# Patient Record
Sex: Male | Born: 1945 | Race: White | Hispanic: No | Marital: Married | Smoking: Former smoker
Health system: Southern US, Community
[De-identification: ages and names within clinical notes are randomized; demographics above are authoritative.]

## PROBLEM LIST (undated history)

## (undated) DIAGNOSIS — L309 Dermatitis, unspecified: Secondary | ICD-10-CM

## (undated) DIAGNOSIS — F419 Anxiety disorder, unspecified: Secondary | ICD-10-CM

## (undated) DIAGNOSIS — I493 Ventricular premature depolarization: Secondary | ICD-10-CM

## (undated) DIAGNOSIS — G4730 Sleep apnea, unspecified: Secondary | ICD-10-CM

## (undated) DIAGNOSIS — M1990 Unspecified osteoarthritis, unspecified site: Secondary | ICD-10-CM

## (undated) DIAGNOSIS — Z9289 Personal history of other medical treatment: Secondary | ICD-10-CM

## (undated) DIAGNOSIS — N189 Chronic kidney disease, unspecified: Secondary | ICD-10-CM

## (undated) DIAGNOSIS — I82409 Acute embolism and thrombosis of unspecified deep veins of unspecified lower extremity: Secondary | ICD-10-CM

## (undated) DIAGNOSIS — G4733 Obstructive sleep apnea (adult) (pediatric): Secondary | ICD-10-CM

## (undated) DIAGNOSIS — I10 Essential (primary) hypertension: Secondary | ICD-10-CM

## (undated) DIAGNOSIS — I2510 Atherosclerotic heart disease of native coronary artery without angina pectoris: Secondary | ICD-10-CM

## (undated) DIAGNOSIS — I471 Supraventricular tachycardia: Secondary | ICD-10-CM

## (undated) DIAGNOSIS — G720 Drug-induced myopathy: Secondary | ICD-10-CM

## (undated) DIAGNOSIS — I4719 Other supraventricular tachycardia: Secondary | ICD-10-CM

## (undated) DIAGNOSIS — E785 Hyperlipidemia, unspecified: Secondary | ICD-10-CM

## (undated) DIAGNOSIS — I700 Atherosclerosis of aorta: Secondary | ICD-10-CM

## (undated) DIAGNOSIS — H3530 Unspecified macular degeneration: Secondary | ICD-10-CM

## (undated) DIAGNOSIS — K219 Gastro-esophageal reflux disease without esophagitis: Secondary | ICD-10-CM

## (undated) DIAGNOSIS — N529 Male erectile dysfunction, unspecified: Secondary | ICD-10-CM

## (undated) DIAGNOSIS — T7840XA Allergy, unspecified, initial encounter: Secondary | ICD-10-CM

## (undated) DIAGNOSIS — E119 Type 2 diabetes mellitus without complications: Secondary | ICD-10-CM

## (undated) HISTORY — DX: Other supraventricular tachycardia: I47.19

## (undated) HISTORY — DX: Supraventricular tachycardia: I47.1

## (undated) HISTORY — DX: Unspecified osteoarthritis, unspecified site: M19.90

## (undated) HISTORY — PX: APPENDECTOMY: SHX54

## (undated) HISTORY — DX: Unspecified macular degeneration: H35.30

## (undated) HISTORY — DX: Hyperlipidemia, unspecified: E78.5

## (undated) HISTORY — PX: HERNIA REPAIR: SHX51

## (undated) HISTORY — DX: Personal history of other medical treatment: Z92.89

## (undated) HISTORY — PX: TONSILLECTOMY: SUR1361

## (undated) HISTORY — DX: Atherosclerosis of aorta: I70.0

## (undated) HISTORY — DX: Drug-induced myopathy: G72.0

## (undated) HISTORY — DX: Gastro-esophageal reflux disease without esophagitis: K21.9

## (undated) HISTORY — DX: Anxiety disorder, unspecified: F41.9

## (undated) HISTORY — DX: Atherosclerotic heart disease of native coronary artery without angina pectoris: I25.10

## (undated) HISTORY — DX: Male erectile dysfunction, unspecified: N52.9

## (undated) HISTORY — PX: VASECTOMY: SHX75

## (undated) HISTORY — DX: Sleep apnea, unspecified: G47.30

## (undated) HISTORY — DX: Allergy, unspecified, initial encounter: T78.40XA

## (undated) HISTORY — DX: Chronic kidney disease, unspecified: N18.9

## (undated) HISTORY — DX: Dermatitis, unspecified: L30.9

## (undated) HISTORY — DX: Obstructive sleep apnea (adult) (pediatric): G47.33

## (undated) HISTORY — DX: Ventricular premature depolarization: I49.3

---

## 1978-05-08 HISTORY — PX: HERNIA REPAIR: SHX51

## 2007-11-03 ENCOUNTER — Emergency Department (HOSPITAL_COMMUNITY): Admission: EM | Admit: 2007-11-03 | Discharge: 2007-11-03 | Payer: Self-pay | Admitting: Emergency Medicine

## 2010-10-04 ENCOUNTER — Ambulatory Visit (HOSPITAL_COMMUNITY)
Admission: RE | Admit: 2010-10-04 | Discharge: 2010-10-04 | Disposition: A | Discharge status: Home / Self Care | Payer: 59 | Source: Ambulatory Visit | Attending: Family Medicine | Admitting: Family Medicine

## 2010-10-04 ENCOUNTER — Other Ambulatory Visit: Payer: Self-pay | Admitting: Family Medicine

## 2010-10-04 DIAGNOSIS — M79609 Pain in unspecified limb: Secondary | ICD-10-CM

## 2010-10-04 DIAGNOSIS — R609 Edema, unspecified: Secondary | ICD-10-CM

## 2010-10-04 DIAGNOSIS — M7989 Other specified soft tissue disorders: Secondary | ICD-10-CM | POA: Insufficient documentation

## 2010-10-04 DIAGNOSIS — I82890 Acute embolism and thrombosis of other specified veins: Secondary | ICD-10-CM | POA: Insufficient documentation

## 2011-09-06 DIAGNOSIS — Z7901 Long term (current) use of anticoagulants: Secondary | ICD-10-CM | POA: Diagnosis not present

## 2011-10-05 DIAGNOSIS — M1990 Unspecified osteoarthritis, unspecified site: Secondary | ICD-10-CM | POA: Diagnosis not present

## 2011-10-05 DIAGNOSIS — Z23 Encounter for immunization: Secondary | ICD-10-CM | POA: Diagnosis not present

## 2011-10-05 DIAGNOSIS — I10 Essential (primary) hypertension: Secondary | ICD-10-CM | POA: Diagnosis not present

## 2011-10-05 DIAGNOSIS — Z5181 Encounter for therapeutic drug level monitoring: Secondary | ICD-10-CM | POA: Diagnosis not present

## 2011-10-05 DIAGNOSIS — E119 Type 2 diabetes mellitus without complications: Secondary | ICD-10-CM | POA: Diagnosis not present

## 2011-10-05 DIAGNOSIS — E785 Hyperlipidemia, unspecified: Secondary | ICD-10-CM | POA: Diagnosis not present

## 2011-10-20 DIAGNOSIS — E119 Type 2 diabetes mellitus without complications: Secondary | ICD-10-CM | POA: Diagnosis not present

## 2011-10-20 DIAGNOSIS — I10 Essential (primary) hypertension: Secondary | ICD-10-CM | POA: Diagnosis not present

## 2011-10-20 DIAGNOSIS — R42 Dizziness and giddiness: Secondary | ICD-10-CM | POA: Diagnosis not present

## 2012-02-20 DIAGNOSIS — Z125 Encounter for screening for malignant neoplasm of prostate: Secondary | ICD-10-CM | POA: Diagnosis not present

## 2012-02-20 DIAGNOSIS — E785 Hyperlipidemia, unspecified: Secondary | ICD-10-CM | POA: Diagnosis not present

## 2012-02-20 DIAGNOSIS — Z136 Encounter for screening for cardiovascular disorders: Secondary | ICD-10-CM | POA: Diagnosis not present

## 2012-02-20 DIAGNOSIS — I10 Essential (primary) hypertension: Secondary | ICD-10-CM | POA: Diagnosis not present

## 2012-02-20 DIAGNOSIS — M1990 Unspecified osteoarthritis, unspecified site: Secondary | ICD-10-CM | POA: Diagnosis not present

## 2012-02-20 DIAGNOSIS — Z23 Encounter for immunization: Secondary | ICD-10-CM | POA: Diagnosis not present

## 2012-02-20 DIAGNOSIS — R404 Transient alteration of awareness: Secondary | ICD-10-CM | POA: Diagnosis not present

## 2012-02-20 DIAGNOSIS — E119 Type 2 diabetes mellitus without complications: Secondary | ICD-10-CM | POA: Diagnosis not present

## 2012-02-20 DIAGNOSIS — Z0000 Encounter for general adult medical examination without abnormal findings: Secondary | ICD-10-CM | POA: Diagnosis not present

## 2012-03-13 DIAGNOSIS — G4733 Obstructive sleep apnea (adult) (pediatric): Secondary | ICD-10-CM | POA: Diagnosis not present

## 2012-03-15 DIAGNOSIS — Z87891 Personal history of nicotine dependence: Secondary | ICD-10-CM | POA: Diagnosis not present

## 2012-05-20 DIAGNOSIS — I10 Essential (primary) hypertension: Secondary | ICD-10-CM | POA: Diagnosis not present

## 2012-05-20 DIAGNOSIS — G4733 Obstructive sleep apnea (adult) (pediatric): Secondary | ICD-10-CM | POA: Diagnosis not present

## 2012-05-20 DIAGNOSIS — E669 Obesity, unspecified: Secondary | ICD-10-CM | POA: Diagnosis not present

## 2012-09-02 DIAGNOSIS — J029 Acute pharyngitis, unspecified: Secondary | ICD-10-CM | POA: Diagnosis not present

## 2012-09-19 DIAGNOSIS — I10 Essential (primary) hypertension: Secondary | ICD-10-CM | POA: Diagnosis not present

## 2012-09-19 DIAGNOSIS — H3530 Unspecified macular degeneration: Secondary | ICD-10-CM | POA: Diagnosis not present

## 2012-09-19 DIAGNOSIS — R809 Proteinuria, unspecified: Secondary | ICD-10-CM | POA: Diagnosis not present

## 2012-09-19 DIAGNOSIS — E1129 Type 2 diabetes mellitus with other diabetic kidney complication: Secondary | ICD-10-CM | POA: Diagnosis not present

## 2012-09-19 DIAGNOSIS — E1165 Type 2 diabetes mellitus with hyperglycemia: Secondary | ICD-10-CM | POA: Diagnosis not present

## 2012-09-19 DIAGNOSIS — E785 Hyperlipidemia, unspecified: Secondary | ICD-10-CM | POA: Diagnosis not present

## 2012-10-17 DIAGNOSIS — IMO0001 Reserved for inherently not codable concepts without codable children: Secondary | ICD-10-CM | POA: Diagnosis not present

## 2012-10-30 DIAGNOSIS — H2510 Age-related nuclear cataract, unspecified eye: Secondary | ICD-10-CM | POA: Diagnosis not present

## 2012-10-30 DIAGNOSIS — H353190 Nonexudative age-related macular degeneration, unspecified eye, stage unspecified: Secondary | ICD-10-CM | POA: Diagnosis not present

## 2012-11-13 ENCOUNTER — Emergency Department (HOSPITAL_COMMUNITY)
Admission: EM | Admit: 2012-11-13 | Discharge: 2012-11-14 | Disposition: A | Discharge status: Home / Self Care | Payer: Medicare Other | Attending: Emergency Medicine | Admitting: Emergency Medicine

## 2012-11-13 ENCOUNTER — Encounter (HOSPITAL_COMMUNITY): Payer: Self-pay | Admitting: Emergency Medicine

## 2012-11-13 DIAGNOSIS — Z86718 Personal history of other venous thrombosis and embolism: Secondary | ICD-10-CM | POA: Insufficient documentation

## 2012-11-13 DIAGNOSIS — E876 Hypokalemia: Secondary | ICD-10-CM | POA: Insufficient documentation

## 2012-11-13 DIAGNOSIS — Z79899 Other long term (current) drug therapy: Secondary | ICD-10-CM | POA: Insufficient documentation

## 2012-11-13 DIAGNOSIS — R609 Edema, unspecified: Secondary | ICD-10-CM | POA: Diagnosis not present

## 2012-11-13 DIAGNOSIS — Z7982 Long term (current) use of aspirin: Secondary | ICD-10-CM | POA: Insufficient documentation

## 2012-11-13 DIAGNOSIS — I10 Essential (primary) hypertension: Secondary | ICD-10-CM | POA: Insufficient documentation

## 2012-11-13 DIAGNOSIS — E119 Type 2 diabetes mellitus without complications: Secondary | ICD-10-CM | POA: Diagnosis not present

## 2012-11-13 DIAGNOSIS — R600 Localized edema: Secondary | ICD-10-CM

## 2012-11-13 HISTORY — DX: Essential (primary) hypertension: I10

## 2012-11-13 HISTORY — DX: Type 2 diabetes mellitus without complications: E11.9

## 2012-11-13 HISTORY — DX: Acute embolism and thrombosis of unspecified deep veins of unspecified lower extremity: I82.409

## 2012-11-13 LAB — COMPREHENSIVE METABOLIC PANEL
BUN: 17 mg/dL (ref 6–23)
Calcium: 8.9 mg/dL (ref 8.4–10.5)
Creatinine, Ser: 1 mg/dL (ref 0.50–1.35)
GFR calc Af Amer: 89 mL/min — ABNORMAL LOW (ref >90)
Glucose, Bld: 118 mg/dL — ABNORMAL HIGH (ref 70–99)
Total Protein: 7.1 g/dL (ref 6.0–8.3)

## 2012-11-13 LAB — CBC WITH DIFFERENTIAL/PLATELET
Eosinophils Absolute: 0.6 10*3/uL (ref 0.0–0.7)
Eosinophils Relative: 8 % — ABNORMAL HIGH (ref 0–5)
Hemoglobin: 13.3 g/dL (ref 13.0–17.0)
Lymphs Abs: 2 10*3/uL (ref 0.7–4.0)
MCH: 32.1 pg (ref 26.0–34.0)
MCV: 90.1 fL (ref 78.0–100.0)
Monocytes Absolute: 0.8 10*3/uL (ref 0.1–1.0)
Monocytes Relative: 10 % (ref 3–12)
Platelets: 220 10*3/uL (ref 150–400)
RBC: 4.14 MIL/uL — ABNORMAL LOW (ref 4.22–5.81)

## 2012-11-13 LAB — PROTIME-INR: Prothrombin Time: 13.7 seconds (ref 11.6–15.2)

## 2012-11-13 NOTE — ED Notes (Signed)
PT. REPORTS PROGRESSING RIGHT LOWER LEG / ANKLE SWELLING FOR 2 DAYS WITH LEFT UPPER INNER THIGH PAIN , PT. STATED HISTORY OD OF DVT . SENSATION INTACT / CAP REFILL < 2 SEC.

## 2012-11-14 ENCOUNTER — Ambulatory Visit (HOSPITAL_COMMUNITY)
Admission: RE | Admit: 2012-11-14 | Discharge: 2012-11-14 | Disposition: A | Discharge status: Home / Self Care | Payer: Medicare Other | Source: Ambulatory Visit | Attending: Emergency Medicine | Admitting: Emergency Medicine

## 2012-11-14 DIAGNOSIS — M7989 Other specified soft tissue disorders: Secondary | ICD-10-CM | POA: Diagnosis not present

## 2012-11-14 MED ORDER — POTASSIUM CHLORIDE CRYS ER 20 MEQ PO TBCR
40.0000 meq | EXTENDED_RELEASE_TABLET | Freq: Once | ORAL | Status: AC
  Administered 2012-11-14: 40 meq via ORAL
  Filled 2012-11-14: qty 2

## 2012-11-14 MED ORDER — ENOXAPARIN SODIUM 100 MG/ML ~~LOC~~ SOLN
95.0000 mg | Freq: Once | SUBCUTANEOUS | Status: AC
  Administered 2012-11-14: 02:00:00 via SUBCUTANEOUS
  Filled 2012-11-14: qty 1

## 2012-11-14 NOTE — ED Provider Notes (Signed)
History    CSN: [id number] Arrival date & time 11/13/12  2214  First MD Initiated Contact with Patient 11/14/12 0050     Chief Complaint  Patient presents with  . Leg Swelling   (Consider location/radiation/quality/duration/timing/severity/associated sxs/prior Treatment) HPI 67 yo male presents to the ER with complaint of left ankle swelling, left thigh pain x 2 days.  Pt is concerned for possible DVT.  He has h/o dvt about 1.5 years ago, finished up coumadin in May.  DVT was thought to be triggered by zipline harness.  Pt recently had abrasion/injury to left ankle from dog leash.  Swelling started yesterday, slightly worse today.  No redness.  Left inner thigh pain started today.  No sob, no chest pain, no h/o PE.    Past Medical History  Diagnosis Date  . Hypertension   . Diabetes mellitus without complication   . DVT (deep venous thrombosis)    History reviewed. No pertinent past surgical history. No family history on file. History  Substance Use Topics  . Smoking status: Never Smoker   . Smokeless tobacco: Not on file  . Alcohol Use: No    Review of Systems  All other systems reviewed and are negative.    Allergies  Review of patient's allergies indicates no known allergies.  Home Medications   Current Outpatient Rx  Name  Route  Sig  Dispense  Refill  . amLODipine (NORVASC) 10 MG tablet   Oral   Take 10 mg by mouth daily.         Marland Kitchen aspirin 81 MG tablet   Oral   Take 81 mg by mouth daily.         . benazepril-hydrochlorthiazide (LOTENSIN HCT) 20-12.5 MG per tablet   Oral   Take 2 tablets by mouth daily.         . metFORMIN (GLUCOPHAGE) 1000 MG tablet   Oral   Take 1,000 mg by mouth 2 (two) times daily with a meal.         . OVER THE COUNTER MEDICATION   Oral   Take 1 tablet by mouth 2 (two) times daily. "A Red 2" supplement for eye health         . simvastatin (ZOCOR) 20 MG tablet   Oral   Take 20 mg by mouth every evening.           BP 124/71  Pulse 58  Temp(Src) 97.6 F (36.4 C) (Oral)  Resp 18  Ht 5' 9.5" (1.765 m)  Wt 208 lb (94.348 kg)  BMI 30.29 kg/m2  SpO2 98% Physical Exam  Nursing note and vitals reviewed. Constitutional: He is oriented to person, place, and time. He appears well-developed and well-nourished.  HENT:  Head: Normocephalic and atraumatic.  Nose: Nose normal.  Mouth/Throat: Oropharynx is clear and moist.  Eyes: Conjunctivae and EOM are normal. Pupils are equal, round, and reactive to light.  Neck: Normal range of motion. Neck supple. No JVD present. No tracheal deviation present. No thyromegaly present.  Cardiovascular: Normal rate, regular rhythm, normal heart sounds and intact distal pulses.  Exam reveals no gallop and no friction rub.   No murmur heard. Pulmonary/Chest: Effort normal and breath sounds normal. No stridor. No respiratory distress. He has no wheezes. He has no rales. He exhibits no tenderness.  Abdominal: Soft. Bowel sounds are normal. He exhibits no distension and no mass. There is no tenderness. There is no rebound and no guarding.  Musculoskeletal: Normal range of motion.  He exhibits edema (edema to left ankle no calf edema noted) and tenderness (ttp of left ankle, lateral more than medial.  Abrasion noted to lateral malleolus, no induration or erythema, warmth.).  Lymphadenopathy:    He has no cervical adenopathy.  Neurological: He is alert and oriented to person, place, and time. He has normal reflexes. No cranial nerve deficit. He exhibits normal muscle tone. Coordination normal.  Skin: Skin is warm and dry. No rash noted. No erythema. No pallor.  Psychiatric: He has a normal mood and affect. His behavior is normal. Judgment and thought content normal.    ED Course  Procedures (including critical care time) Labs Reviewed  CBC WITH DIFFERENTIAL - Abnormal; Notable for the following:    RBC 4.14 (*)    HCT 37.3 (*)    Eosinophils Relative 8 (*)    All other  components within normal limits  COMPREHENSIVE METABOLIC PANEL - Abnormal; Notable for the following:    Potassium 2.9 (*)    Glucose, Bld 118 (*)    GFR calc non Af Amer 76 (*)    GFR calc Af Amer 89 (*)    All other components within normal limits  PROTIME-INR   No results found. 1. Hypokalemia   2. Edema of lower extremity     MDM  67 yo male with swelling of left ankle, left thigh pain.  Pt with recent injury to left ankle with residual abrasion.  Has h/o DVT.  No swelling or pain to calf, only mild pain to thigh.  Will give lovenox here tonight, have patient f/u in am for doppler studies.  Per Anner Crete, pt is low risk for dvt.  Possible swelling related to leash injury.  Noted to be hypokalemic, on hctz.  Given dose here of K, will have patient increase dietary K.  Olivia Mackie, MD 11/14/12 [phone]

## 2012-11-14 NOTE — Progress Notes (Signed)
*  Preliminary Results* Left lower extremity venous duplex completed. Left lower extremity is negative for deep vein thrombosis. There is no evidence of left Baker's cyst.  11/14/2012 8:48 AM  Gertie Fey, RVT, RDCS, RDMS

## 2012-11-18 DIAGNOSIS — L03119 Cellulitis of unspecified part of limb: Secondary | ICD-10-CM | POA: Diagnosis not present

## 2012-11-18 DIAGNOSIS — L02619 Cutaneous abscess of unspecified foot: Secondary | ICD-10-CM | POA: Diagnosis not present

## 2012-11-18 DIAGNOSIS — E876 Hypokalemia: Secondary | ICD-10-CM | POA: Diagnosis not present

## 2012-11-21 DIAGNOSIS — G4733 Obstructive sleep apnea (adult) (pediatric): Secondary | ICD-10-CM | POA: Diagnosis not present

## 2012-11-21 DIAGNOSIS — E669 Obesity, unspecified: Secondary | ICD-10-CM | POA: Diagnosis not present

## 2012-11-21 DIAGNOSIS — I10 Essential (primary) hypertension: Secondary | ICD-10-CM | POA: Diagnosis not present

## 2013-05-07 ENCOUNTER — Encounter: Payer: Self-pay | Admitting: General Surgery

## 2013-05-07 DIAGNOSIS — E669 Obesity, unspecified: Secondary | ICD-10-CM | POA: Insufficient documentation

## 2013-05-07 DIAGNOSIS — I10 Essential (primary) hypertension: Secondary | ICD-10-CM

## 2013-05-07 DIAGNOSIS — G4733 Obstructive sleep apnea (adult) (pediatric): Secondary | ICD-10-CM

## 2013-05-12 DIAGNOSIS — Z23 Encounter for immunization: Secondary | ICD-10-CM | POA: Diagnosis not present

## 2013-05-28 ENCOUNTER — Ambulatory Visit (INDEPENDENT_AMBULATORY_CARE_PROVIDER_SITE_OTHER): Payer: Medicare Other | Admitting: Cardiology

## 2013-05-28 ENCOUNTER — Encounter: Payer: Self-pay | Admitting: Cardiology

## 2013-05-28 VITALS — BP 130/72 | HR 76 | Ht 70.0 in | Wt 213.1 lb

## 2013-05-28 DIAGNOSIS — G4733 Obstructive sleep apnea (adult) (pediatric): Secondary | ICD-10-CM

## 2013-05-28 DIAGNOSIS — I10 Essential (primary) hypertension: Secondary | ICD-10-CM

## 2013-05-28 DIAGNOSIS — E669 Obesity, unspecified: Secondary | ICD-10-CM | POA: Diagnosis not present

## 2013-05-28 NOTE — Progress Notes (Signed)
[facility], [place], [place]  [address] Phone: [phone] Fax:  [phone]  Date:  05/28/2013   ID:  Carl Knapp, Carl Knapp 04/04/1946, MRN [id number]  PCP:  Vena Austria, MD  Cardiologist:  Fransico Him, MD     History of Present Illness: Carl Knapp is a 68 y.o. male with a history of OSA on CPAP, HTN and obesity who presents today for followup.  He is doing well.  He tolerates his CPAP without any difficulties.  He tolerates the nasal pillow mask and feels the pressure is adequate.  He feels rested in the am and has no daytime sleepiness.  He walks about 3-4 miles daily with the dog and is trying to follow a low fat diet.   Wt Readings from Last 3 Encounters:  11/21/12 211 lb 3.2 oz (95.8 kg)  11/14/12 208 lb (94.348 kg)     Past Medical History  Diagnosis Date  . Hypertension   . DVT (deep venous thrombosis)   . Macular degeneration   . 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 Hyperlipidemia     LDL goal<100  . OSA (obstructive sleep apnea)     Severe w AHI 37/hr now on CPAP at 10cm H2O    Current Outpatient Prescriptions  Medication Sig Dispense Refill  . amLODipine (NORVASC) 10 MG tablet Take 10 mg by mouth daily.      Marland Kitchen aspirin 81 MG tablet Take 81 mg by mouth daily.      . cephALEXin (KEFLEX) 500 MG capsule Take 500 mg by mouth 2 (two) times daily.      Marland Kitchen lisinopril-hydrochlorothiazide (PRINZIDE,ZESTORETIC) 20-12.5 MG per tablet Take 2 tablets by mouth daily.      . metFORMIN (GLUCOPHAGE) 1000 MG tablet Take 1,000 mg by mouth 2 (two) times daily with a meal.      . Multiple Vitamins-Minerals (EYE VITAMINS PO) Take 1 capsule by mouth 2 (two) times daily. To Slow macular degeneration      . naproxen sodium (ANAPROX) 550 MG tablet Take 550 mg by mouth as needed (rarely used for pain).      Marland Kitchen OVER THE COUNTER MEDICATION Take 1 tablet by mouth 2 (two) times daily. "A Red 2" supplement for eye health      . simvastatin (ZOCOR) 20 MG  tablet Take 20 mg by mouth every evening.       No current facility-administered medications for this visit.    Allergies:   No Known Allergies  Social History:  The patient  reports that he quit smoking about 2 years ago. His smoking use included Cigarettes. He smoked 0.00 packs per day. He does not have any smokeless tobacco history on file. He reports that he drinks alcohol. He reports that he does not use illicit drugs.   Family History:  The patient's family history is not on file.   ROS:  Please see the history of present illness.      All other systems reviewed and negative.   PHYSICAL EXAM: VS:  There were no vitals taken for this visit. Well nourished, well developed, in no acute distress HEENT: normal Neck: no JVD Cardiac:  normal S1, S2; RRR; no murmur Lungs:  clear to auscultation bilaterally, no wheezing, rhonchi or rales Abd: soft, nontender, no hepatomegaly Ext: no edema Skin: warm and dry Neuro:  CNs 2-12 intact, no focal abnormalities noted       ASSESSMENT AND PLAN:  1. OSA on CPAP and tolerating well - his download today showed an AHI of 1.8/hr on 10cm H2O and 96% compliant in using more than 4 hours.    - he will continue on current settings. 2. HTN - well controlled  - continue amlodipine/Zestoretic 3. Obesity - I have encouraged him to continue with exercise and diet  Followup with me in  6 months  Signed, Fransico Him, MD 05/28/2013 9:42 AM

## 2013-05-28 NOTE — Patient Instructions (Signed)
Your physician recommends that you continue on your current medications as directed. Please refer to the Current Medication list given to you today.  Your physician wants you to follow-up in: 6 months with Dr Turner You will receive a reminder letter in the mail two months in advance. If you don't receive a letter, please call our office to schedule the follow-up appointment.  

## 2013-10-06 DIAGNOSIS — E785 Hyperlipidemia, unspecified: Secondary | ICD-10-CM | POA: Diagnosis not present

## 2013-10-06 DIAGNOSIS — R079 Chest pain, unspecified: Secondary | ICD-10-CM | POA: Diagnosis not present

## 2013-10-06 DIAGNOSIS — M25539 Pain in unspecified wrist: Secondary | ICD-10-CM | POA: Diagnosis not present

## 2013-10-06 DIAGNOSIS — E1129 Type 2 diabetes mellitus with other diabetic kidney complication: Secondary | ICD-10-CM | POA: Diagnosis not present

## 2013-10-06 DIAGNOSIS — I10 Essential (primary) hypertension: Secondary | ICD-10-CM | POA: Diagnosis not present

## 2013-10-06 DIAGNOSIS — R809 Proteinuria, unspecified: Secondary | ICD-10-CM | POA: Diagnosis not present

## 2013-10-06 DIAGNOSIS — Z1331 Encounter for screening for depression: Secondary | ICD-10-CM | POA: Diagnosis not present

## 2013-10-06 DIAGNOSIS — Z0000 Encounter for general adult medical examination without abnormal findings: Secondary | ICD-10-CM | POA: Diagnosis not present

## 2013-10-14 ENCOUNTER — Encounter: Payer: Medicare Other | Admitting: Physician Assistant

## 2013-11-20 ENCOUNTER — Telehealth: Payer: Self-pay | Admitting: General Surgery

## 2013-11-20 ENCOUNTER — Ambulatory Visit (INDEPENDENT_AMBULATORY_CARE_PROVIDER_SITE_OTHER): Payer: Medicare Other | Admitting: Physician Assistant

## 2013-11-20 ENCOUNTER — Other Ambulatory Visit: Payer: Self-pay

## 2013-11-20 DIAGNOSIS — R0789 Other chest pain: Secondary | ICD-10-CM

## 2013-11-20 DIAGNOSIS — R079 Chest pain, unspecified: Secondary | ICD-10-CM | POA: Diagnosis not present

## 2013-11-20 NOTE — Telephone Encounter (Signed)
Dean has a Management consultant on 11/27/13. He sees [place] for sleep but is having CP w exertion. He wants [place] to see him for Cardiology since we already see him for sleep. He is scheduled to see [place] in September. Do we want to keep that appt for now at least until he has his nuclear? If it is abnormal we can fit him in sooner?  TO Dr Radford Pax to advise.

## 2013-11-20 NOTE — Progress Notes (Signed)
Exercise Treadmill Test  Carl Knapp is a 68 y.o. male ex-smoker with a hx of DM, HTN, HL and FHX of CAD referred by PCP for exertional chest, L arm and L jaw pain.  Exam unremarkable.  ECG without ischemic changes.  Pre-Exercise Testing Evaluation Rhythm: normal sinus  Rate: 74 bpm     Test  Exercise Tolerance Test Ordering MD: Mertie Moores, MD  Interpreting MD: Richardson Dopp, PA-C  Unique Test No: 1  Treadmill:  1  Indication for ETT: chest pain - rule out ischemia  Contraindication to ETT: No   Stress Modality: exercise - treadmill  Cardiac Imaging Performed: non   Protocol: standard Bruce - maximal  Max BP:  208/94  Max MPHR (bpm):  153 85% MPR (bpm):  130  MPHR obtained (bpm):  130 % MPHR obtained:  85  Reached 85% MPHR (min:sec):  7:23 Total Exercise Time (min-sec):  7:30  Workload in METS:  9.3 Borg Scale: 15  Reason ETT Terminated:  angina chest pain    ST Segment Analysis At Rest: normal ST segments - no evidence of significant ST depression With Exercise: non-specific ST changes  Other Information Arrhythmia:  frequent PVCs throughout exercise Angina during ETT:  test stopped due to (2) Quality of ETT:  diagnostic  ETT Interpretation:  normal - no evidence of ischemia by ST analysis  Comments: Good exercise capacity. + chest pain.  + L arm pain Normal BP response to exercise. There were no significant ST changes to suggest ischemia.    Recommendations: Reviewed ETT with Dr. Liam Rogers (DOD). Given high pre-test probability of CAD and reproducible chest pain with stress, we recommend proceeding with ETT-Myoview. This will be arranged. Earlier f/u with Dr. Fransico Him will be arranged as well.  Signed,  Richardson Dopp, PA-C   11/20/2013 9:59 AM

## 2013-11-21 NOTE — Telephone Encounter (Signed)
He needs to be seen - please get him in to see PA at either location ASAP and if CP worsens over weekend needs to go to ER

## 2013-11-21 NOTE — Telephone Encounter (Signed)
Pt scheduled with DOD on Monday

## 2013-11-21 NOTE — Telephone Encounter (Signed)
Pt is aware and forwarded to scheduling to schedule pt asap.

## 2013-11-23 ENCOUNTER — Encounter: Payer: Self-pay | Admitting: Cardiology

## 2013-11-24 ENCOUNTER — Ambulatory Visit (INDEPENDENT_AMBULATORY_CARE_PROVIDER_SITE_OTHER): Payer: Medicare Other | Admitting: Interventional Cardiology

## 2013-11-24 ENCOUNTER — Encounter: Payer: Self-pay | Admitting: Interventional Cardiology

## 2013-11-24 VITALS — BP 127/58 | HR 69 | Ht 69.5 in | Wt 212.0 lb

## 2013-11-24 DIAGNOSIS — I10 Essential (primary) hypertension: Secondary | ICD-10-CM | POA: Diagnosis not present

## 2013-11-24 DIAGNOSIS — R079 Chest pain, unspecified: Secondary | ICD-10-CM | POA: Diagnosis not present

## 2013-11-24 DIAGNOSIS — G4733 Obstructive sleep apnea (adult) (pediatric): Secondary | ICD-10-CM

## 2013-11-24 MED ORDER — NITROGLYCERIN 0.4 MG SL SUBL: 0.4000 mg | SUBLINGUAL_TABLET | SUBLINGUAL | Status: DC | PRN

## 2013-11-24 NOTE — Patient Instructions (Addendum)
Your physician has recommended you make the following change in your medication:   1. Start Nitroglycerin 0.4 mg as needed.   Keep upcoming appointments for Myoview and appt with Dr. Radford Pax.  Nitroglycerin sublingual tablets What is this medicine? NITROGLYCERIN (nye troe GLI ser in) is a type of vasodilator. It relaxes blood vessels, increasing the blood and oxygen supply to your heart. This medicine is used to relieve chest pain caused by angina. It is also used to prevent chest pain before activities like climbing stairs, going outdoors in cold weather, or sexual activity. This medicine may be used for other purposes; ask your health care provider or pharmacist if you have questions. COMMON BRAND NAME(S): Nitroquick, Nitrostat, Nitrotab What should I tell my health care provider before I take this medicine? They need to know if you have any of these conditions: -anemia -head injury, recent stroke, or bleeding in the brain -liver disease -previous heart attack -an unusual or allergic reaction to nitroglycerin, other medicines, foods, dyes, or preservatives -pregnant or trying to get pregnant -breast-feeding How should I use this medicine? Take this medicine by mouth as needed. At the first sign of an angina attack (chest pain or tightness) place one tablet under your tongue. You can also take this medicine 5 to 10 minutes before an event likely to produce chest pain. Follow the directions on the prescription label. Let the tablet dissolve under the tongue. Do not swallow whole. Replace the dose if you accidentally swallow it. It will help if your mouth is not dry. Saliva around the tablet will help it to dissolve more quickly. Do not eat or drink, smoke or chew tobacco while a tablet is dissolving. If you are not better within 5 minutes after taking ONE dose of nitroglycerin, call 9-1-1 immediately to seek emergency medical care. Do not take more than 3 nitroglycerin tablets over 15  minutes. If you take this medicine often to relieve symptoms of angina, your doctor or health care professional may provide you with different instructions to manage your symptoms. If symptoms do not go away after following these instructions, it is important to call 9-1-1 immediately. Do not take more than 3 nitroglycerin tablets over 15 minutes. Talk to your pediatrician regarding the use of this medicine in children. Special care may be needed. Overdosage: If you think you have taken too much of this medicine contact a poison control center or emergency room at once. NOTE: This medicine is only for you. Do not share this medicine with others. What if I miss a dose? This does not apply. This medicine is only used as needed. What may interact with this medicine? Do not take this medicine with any of the following medications: -certain migraine medicines like ergotamine and dihydroergotamine (DHE) -medicines used to treat erectile dysfunction like sildenafil, tadalafil, and vardenafil -riociguat This medicine may also interact with the following medications: -alteplase -aspirin -heparin -medicines for high blood pressure -medicines for mental depression -other medicines used to treat angina -phenothiazines like chlorpromazine, mesoridazine, prochlorperazine, thioridazine This list may not describe all possible interactions. Give your health care provider a list of all the medicines, herbs, non-prescription drugs, or dietary supplements you use. Also tell them if you smoke, drink alcohol, or use illegal drugs. Some items may interact with your medicine. What should I watch for while using this medicine? Tell your doctor or health care professional if you feel your medicine is no longer working. Keep this medicine with you at all times. Sit or  lie down when you take your medicine to prevent falling if you feel dizzy or faint after using it. Try to remain calm. This will help you to feel better  faster. If you feel dizzy, take several deep breaths and lie down with your feet propped up, or bend forward with your head resting between your knees. You may get drowsy or dizzy. Do not drive, use machinery, or do anything that needs mental alertness until you know how this drug affects you. Do not stand or sit up quickly, especially if you are an older patient. This reduces the risk of dizzy or fainting spells. Alcohol can make you more drowsy and dizzy. Avoid alcoholic drinks. Do not treat yourself for coughs, colds, or pain while you are taking this medicine without asking your doctor or health care professional for advice. Some ingredients may increase your blood pressure. What side effects may I notice from receiving this medicine? Side effects that you should report to your doctor or health care professional as soon as possible: -blurred vision -dry mouth -skin rash -sweating -the feeling of extreme pressure in the head -unusually weak or tired Side effects that usually do not require medical attention (report to your doctor or health care professional if they continue or are bothersome): -flushing of the face or neck -headache -irregular heartbeat, palpitations -nausea, vomiting This list may not describe all possible side effects. Call your doctor for medical advice about side effects. You may report side effects to FDA at 1-800-FDA-1088. Where should I keep my medicine? Keep out of the reach of children. Store at room temperature between 20 and 25 degrees C (68 and 77 degrees F). Store in [employer] of Staff. Protect from light and moisture. Keep tightly closed. Throw away any unused medicine after the expiration date. NOTE: This sheet is a summary. It may not cover all possible information. If you have questions about this medicine, talk to your doctor, pharmacist, or health care provider.  2015, [employer]vier/[employer] Standard. (2013-02-13 10:27:26)

## 2013-11-24 NOTE — Progress Notes (Signed)
Patient ID: Carl Knapp, male   DOB: 1946/02/28, 68 y.o.   MRN: [id number]    [address] [place], [place]  [address] Phone: [phone] Fax:  [phone]  Date:  11/24/2013   ID:  Carl Knapp, DOB [birth year], MRN [id number]  PCP:  Vena Austria, MD   ASSESSMENT:   1. 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 Hypertension 3. Hyperlipidemia 4. Diabetes mellitus  PLAN:  1. Stress myocardial perfusion study as outlined on the previous note by Richardson Dopp, PA 2. If normal or low risk study, manage angina pectoris with medical therapy by adding a beta blocker. A beta blocker therapy does not suppress symptoms within the first 2 coronary angiography to define anatomy. If by Peterson Ao perfusion study is moderate to high risk, he should have immediate coronary angiography to define anatomy and help guide therapy 3. Nitroglycerin 0.4 mg sublingually when necessary of rest pain or prolonged pain 4. Followup with Dr. Golden Hurter   SUBJECTIVE: Carl Knapp is a 68 y.o. male who has a six-month history of exertional chest discomfort radiating into the left neck and jaw. The discomfort starts approximately 3/4 of bowel into a wall be gradual incline when he is out with his daughter. He began noticing this in October. He has not had discomfort at rest. The discomfort goes away with rest. He did experience this discomfort during an exercise treadmill test here in the office last week. Stress test without wise unremarkable after 7-1/2 minutes. No EKG changes of ischemia were noted.  He has a 2 year history of diabetes mellitus.   Wt Readings from Last 3 Encounters:  11/24/13 212 lb (96.163 kg)  05/28/13 213 lb 1.9 oz (96.671 kg)  11/21/12 211 lb 3.2 oz (95.8 kg)     Past Medical History  Diagnosis Date  . Hypertension   . DVT (deep venous thrombosis)   . Macular degeneration   . 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 Hyperlipidemia    LDL goal<100  . OSA (obstructive sleep apnea)     Severe w AHI 37/hr now on CPAP at 10cm H2O    Current Outpatient Prescriptions  Medication Sig Dispense Refill  . amLODipine (NORVASC) 10 MG tablet Take 10 mg by mouth daily.      Marland Kitchen aspirin 81 MG tablet Take 81 mg by mouth daily.      Marland Kitchen lisinopril-hydrochlorothiazide (PRINZIDE,ZESTORETIC) 20-12.5 MG per tablet Take 2 tablets by mouth daily.      . metFORMIN (GLUCOPHAGE) 1000 MG tablet Take 1,000 mg by mouth 2 (two) times daily with a meal.      . naproxen sodium (ANAPROX) 550 MG tablet Take 550 mg by mouth as needed (rarely used for pain).      . simvastatin (ZOCOR) 20 MG tablet Take 20 mg by mouth every evening.       No current facility-administered medications for this visit.    Allergies:   No Known Allergies  Social History:  The patient  reports that he quit smoking about 3 years ago. His smoking use included Cigarettes. He smoked 0.00 packs per day. He does not have any smokeless tobacco history on file. He reports that he drinks alcohol. He reports that he does not use illicit drugs.   ROS:  Please see the history of present illness.   Denies claudication, neurological complaints, palpitations, edema, syncope, and tobacco use. He discontinue smoking 2 years ago  to   All other systems reviewed and negative.   OBJECTIVE: VS:  BP 127/58  Pulse 69  Ht 5' 9.5" (1.765 m)  Wt 212 lb (96.163 kg)  BMI 30.87 kg/m2 Well nourished, well developed, in no acute distress, mildly obese HEENT: normal Neck: JVD  flat.  Carotid bruit absent   Cardiac:  normal S1, S2; RRR; no murmur Lungs:  clear to auscultation bilaterally, no wheezing, rhonchi or rales Abd: soft, nontender, no hepatomegaly Ext: Edema absent . Pulses 2+  Skin: warm and dry Neuro:  CNs 2-12 intact, no focal abnormalities noted  EKG:  normal       Signed, Illene Labrador III, MD 11/24/2013 9:27 AM

## 2013-11-25 ENCOUNTER — Encounter: Payer: Self-pay | Admitting: General Surgery

## 2013-11-27 ENCOUNTER — Ambulatory Visit (HOSPITAL_COMMUNITY): Payer: Medicare Other | Attending: Physician Assistant | Admitting: Radiology

## 2013-11-27 VITALS — BP 140/67 | HR 63 | Ht 69.0 in | Wt 209.0 lb

## 2013-11-27 DIAGNOSIS — R079 Chest pain, unspecified: Secondary | ICD-10-CM | POA: Diagnosis not present

## 2013-11-27 DIAGNOSIS — E119 Type 2 diabetes mellitus without complications: Secondary | ICD-10-CM | POA: Insufficient documentation

## 2013-11-27 DIAGNOSIS — I10 Essential (primary) hypertension: Secondary | ICD-10-CM | POA: Insufficient documentation

## 2013-11-27 DIAGNOSIS — Z87891 Personal history of nicotine dependence: Secondary | ICD-10-CM | POA: Diagnosis not present

## 2013-11-27 MED ORDER — TECHNETIUM TC 99M SESTAMIBI GENERIC - CARDIOLITE
11.0000 | Freq: Once | INTRAVENOUS | Status: AC | PRN
  Administered 2013-11-27: 11 via INTRAVENOUS

## 2013-11-27 MED ORDER — TECHNETIUM TC 99M SESTAMIBI GENERIC - CARDIOLITE
33.0000 | Freq: Once | INTRAVENOUS | Status: AC | PRN
  Administered 2013-11-27: 33 via INTRAVENOUS

## 2013-11-27 NOTE — Progress Notes (Signed)
Carl Knapp [address] [place], [place] [address] [phone]    Cardiology Nuclear Med Study  Carl Knapp is a 68 y.o. male     MRN : [id number]     DOB: May 04, 1946  Procedure Date: 11/27/2013  Nuclear Med Background Indication for Stress Test:  Evaluation for Ischemia History:  No known CAD, GXT 11/2013 (positive) Cardiac Risk Factors: History of Smoking, Hypertension, Lipids and NIDDM  Symptoms:  Chest Pain with Exertion (last date of chest discomfort was yesterday)   Nuclear Pre-Procedure Caffeine/Decaff Intake:  None NPO After: 6 pm   Lungs:  clear O2 Sat: 96% on room air. IV 0.9% NS with Angio Cath:  22g  IV Site: R Hand  IV Started by:  Crissie Figures, RN  Chest Size (in):  44 Cup Size: n/a  Height: 5\' 9"  (1.753 m)  Weight:  209 lb (94.802 kg)  BMI:  Body mass index is 30.85 kg/(m^2). Tech Comments:  N/A    Nuclear Med Study 1 or 2 day study: 1 day  Stress Test Type:  Stress  Reading MD: N/A  Order Authorizing Provider:  Fransico Him, MD  Resting Radionuclide: Technetium 74m Sestamibi  Resting Radionuclide Dose: 11.0 mCi   Stress Radionuclide:  Technetium 106m Sestamibi  Stress Radionuclide Dose: 33.0 mCi           Stress Protocol Rest HR: 63 Stress HR: 134  Rest BP: 140/67 Stress BP: 198/59  Exercise Time (min): 8:30 METS: 10.1           Dose of Adenosine (mg):  n/a Dose of Lexiscan: n/a mg  Dose of Atropine (mg): n/a Dose of Dobutamine: n/a mcg/kg/min (at max HR)  Stress Test Technologist: Glade Lloyd, BS-ES  Nuclear Technologist:  Annye Rusk, CNMT     Rest Procedure:  Myocardial perfusion imaging was performed at rest 45 minutes following the intravenous administration of Technetium 37m Sestamibi. Rest ECG: NSR - Normal EKG  Stress Procedure:  The patient exercised on the treadmill utilizing the Bruce Protocol for 8:30 minutes. The patient stopped due to fatigue and chest pain 2-3/10 that radiated into his jaw with  associated dizziness.  Technetium 57m Sestamibi was injected at peak exercise and myocardial perfusion imaging was performed after a brief delay. Stress ECG: 1 mm upsloping ST segment depression in inferior leads with frequent ventricular ectopy  QPS Raw Data Images:  Patient motion noted. Stress Images:  Normal homogeneous uptake in all areas of the myocardium. Rest Images:  Normal homogeneous uptake in all areas of the myocardium. Subtraction (SDS):  No evidence of ischemia. Transient Ischemic Dilatation (Normal <1.22):  1.27 Lung/Heart Ratio (Normal <0.45):  0.35  Quantitative Gated Spect Images QGS EDV:  124 ml QGS ESV:  35 ml  Impression Exercise Capacity:  Fair exercise capacity. BP Response:  Hypertensive blood pressure response. Clinical Symptoms:  Typical chest pain. ECG Impression:  borderline upsloping ST segment changes in inferior leads Comparison with Prior Nuclear Study: No images to compare  Overall Impression:  Low risk stress nuclear study  Normal perfusion images.  However ECG with upsloping ST segment depression, frequent PVC  and chest pain radiating to jaw.  Suggest further ischemic w/u despite normal perfusion.  LV Ejection Fraction: 61%.  LV Wall Motion:  NL LV Function; NL Wall Motion   [name]

## 2013-11-28 ENCOUNTER — Telehealth: Payer: Self-pay

## 2013-11-30 ENCOUNTER — Encounter: Payer: Self-pay | Admitting: Physician Assistant

## 2013-12-01 NOTE — Telephone Encounter (Signed)
New message  ° ° ° °Patient returning call back to nurse from Friday.   °

## 2013-12-01 NOTE — Telephone Encounter (Signed)
Dr Radford Pax was Pt aware that Cath might need to happen? If not do we need to have pt come in or do you  Need to call to explain heart cath?

## 2013-12-01 NOTE — Telephone Encounter (Signed)
Follow up ° ° ° ° ° °Pt is returning a nurses call from friday °

## 2013-12-01 NOTE — Telephone Encounter (Signed)
I have just talked with patient about the results of the stress test and recommendations to proceed with cath.  Cardiac catheterization was discussed with the patient fully including risks on myocardial infarction, death, stroke, bleeding, arrhythmia, dye allergy, renal insufficiency or bleeding.  All patient questions and concerns were discussed and the patient understands and is willing to proceed. Please set up for the next week with one of the interventionalist

## 2013-12-02 ENCOUNTER — Encounter: Payer: Self-pay | Admitting: General Surgery

## 2013-12-02 ENCOUNTER — Other Ambulatory Visit: Payer: Self-pay | Admitting: General Surgery

## 2013-12-02 DIAGNOSIS — R079 Chest pain, unspecified: Secondary | ICD-10-CM

## 2013-12-02 NOTE — Telephone Encounter (Signed)
Pt is aware and scheduled for Cath on 12/09/13 with Dr Irish Lack. Pt has labs on Fri July 31st.

## 2013-12-03 ENCOUNTER — Other Ambulatory Visit: Payer: Self-pay | Admitting: Cardiology

## 2013-12-03 DIAGNOSIS — R079 Chest pain, unspecified: Secondary | ICD-10-CM

## 2013-12-04 ENCOUNTER — Encounter: Payer: Self-pay | Admitting: Cardiology

## 2013-12-04 ENCOUNTER — Encounter (HOSPITAL_COMMUNITY): Payer: Self-pay | Admitting: Pharmacy Technician

## 2013-12-04 DIAGNOSIS — Z125 Encounter for screening for malignant neoplasm of prostate: Secondary | ICD-10-CM | POA: Diagnosis not present

## 2013-12-05 ENCOUNTER — Other Ambulatory Visit (INDEPENDENT_AMBULATORY_CARE_PROVIDER_SITE_OTHER): Payer: Medicare Other

## 2013-12-05 DIAGNOSIS — R079 Chest pain, unspecified: Secondary | ICD-10-CM

## 2013-12-05 LAB — PROTIME-INR
INR: 1.1 ratio — ABNORMAL HIGH (ref 0.8–1.0)
Prothrombin Time: 12 s (ref 9.6–13.1)

## 2013-12-06 DIAGNOSIS — I2510 Atherosclerotic heart disease of native coronary artery without angina pectoris: Secondary | ICD-10-CM

## 2013-12-06 HISTORY — DX: Atherosclerotic heart disease of native coronary artery without angina pectoris: I25.10

## 2013-12-09 ENCOUNTER — Encounter (HOSPITAL_COMMUNITY)
Admission: RE | Disposition: A | Discharge status: Home / Self Care | Payer: Self-pay | Source: Ambulatory Visit | Attending: Interventional Cardiology

## 2013-12-09 ENCOUNTER — Ambulatory Visit (HOSPITAL_COMMUNITY)
Admission: RE | Admit: 2013-12-09 | Discharge: 2013-12-09 | Disposition: A | Discharge status: Home / Self Care | Payer: Medicare Other | Source: Ambulatory Visit | Attending: Interventional Cardiology | Admitting: Interventional Cardiology

## 2013-12-09 DIAGNOSIS — G4733 Obstructive sleep apnea (adult) (pediatric): Secondary | ICD-10-CM | POA: Insufficient documentation

## 2013-12-09 DIAGNOSIS — E119 Type 2 diabetes mellitus without complications: Secondary | ICD-10-CM | POA: Diagnosis not present

## 2013-12-09 DIAGNOSIS — E669 Obesity, unspecified: Secondary | ICD-10-CM | POA: Diagnosis not present

## 2013-12-09 DIAGNOSIS — R079 Chest pain, unspecified: Secondary | ICD-10-CM

## 2013-12-09 DIAGNOSIS — I10 Essential (primary) hypertension: Secondary | ICD-10-CM | POA: Diagnosis not present

## 2013-12-09 DIAGNOSIS — E785 Hyperlipidemia, unspecified: Secondary | ICD-10-CM | POA: Diagnosis not present

## 2013-12-09 DIAGNOSIS — Z79899 Other long term (current) drug therapy: Secondary | ICD-10-CM | POA: Insufficient documentation

## 2013-12-09 DIAGNOSIS — Z9989 Dependence on other enabling machines and devices: Secondary | ICD-10-CM | POA: Insufficient documentation

## 2013-12-09 DIAGNOSIS — F1010 Alcohol abuse, uncomplicated: Secondary | ICD-10-CM | POA: Diagnosis not present

## 2013-12-09 DIAGNOSIS — Z6830 Body mass index (BMI) 30.0-30.9, adult: Secondary | ICD-10-CM | POA: Diagnosis not present

## 2013-12-09 DIAGNOSIS — Z7982 Long term (current) use of aspirin: Secondary | ICD-10-CM | POA: Insufficient documentation

## 2013-12-09 DIAGNOSIS — Z87891 Personal history of nicotine dependence: Secondary | ICD-10-CM | POA: Diagnosis not present

## 2013-12-09 DIAGNOSIS — I2510 Atherosclerotic heart disease of native coronary artery without angina pectoris: Secondary | ICD-10-CM | POA: Diagnosis not present

## 2013-12-09 DIAGNOSIS — Z791 Long term (current) use of non-steroidal anti-inflammatories (NSAID): Secondary | ICD-10-CM | POA: Diagnosis not present

## 2013-12-09 HISTORY — PX: LEFT HEART CATHETERIZATION WITH CORONARY ANGIOGRAM: SHX5451

## 2013-12-09 LAB — GLUCOSE, CAPILLARY
Glucose-Capillary: 122 mg/dL — ABNORMAL HIGH (ref 70–99)
Glucose-Capillary: 95 mg/dL (ref 70–99)

## 2013-12-09 SURGERY — LEFT HEART CATHETERIZATION WITH CORONARY ANGIOGRAM
Anesthesia: LOCAL

## 2013-12-09 MED ORDER — SODIUM CHLORIDE 0.9 % IV SOLN
INTRAVENOUS | Status: DC
  Administered 2013-12-09: 07:00:00 via INTRAVENOUS

## 2013-12-09 MED ORDER — FENTANYL CITRATE 0.05 MG/ML IJ SOLN
INTRAMUSCULAR | Status: AC
  Filled 2013-12-09: qty 2

## 2013-12-09 MED ORDER — HEPARIN SODIUM (PORCINE) 1000 UNIT/ML IJ SOLN
INTRAMUSCULAR | Status: AC
  Filled 2013-12-09: qty 1

## 2013-12-09 MED ORDER — HEPARIN (PORCINE) IN NACL 2-0.9 UNIT/ML-% IJ SOLN
INTRAMUSCULAR | Status: AC
  Filled 2013-12-09: qty 1000

## 2013-12-09 MED ORDER — SODIUM CHLORIDE 0.9 % IV SOLN: 250.0000 mL | INTRAVENOUS | Status: DC | PRN

## 2013-12-09 MED ORDER — ASPIRIN 81 MG PO CHEW
81.0000 mg | CHEWABLE_TABLET | ORAL | Status: AC
  Administered 2013-12-09: 81 mg via ORAL

## 2013-12-09 MED ORDER — SODIUM CHLORIDE 0.9 % IV SOLN: 1.0000 mL/kg/h | INTRAVENOUS | Status: DC

## 2013-12-09 MED ORDER — LIDOCAINE HCL (PF) 1 % IJ SOLN
INTRAMUSCULAR | Status: AC
  Filled 2013-12-09: qty 30

## 2013-12-09 MED ORDER — MIDAZOLAM HCL 2 MG/2ML IJ SOLN
INTRAMUSCULAR | Status: AC
  Filled 2013-12-09: qty 2

## 2013-12-09 MED ORDER — SODIUM CHLORIDE 0.9 % IJ SOLN: 3.0000 mL | Freq: Two times a day (BID) | INTRAMUSCULAR | Status: DC

## 2013-12-09 MED ORDER — ASPIRIN 81 MG PO CHEW
CHEWABLE_TABLET | ORAL | Status: AC
  Administered 2013-12-09: 81 mg via ORAL
  Filled 2013-12-09: qty 1

## 2013-12-09 MED ORDER — METFORMIN HCL 500 MG PO TABS: 1000.0000 mg | ORAL_TABLET | Freq: Two times a day (BID) | ORAL | Status: DC

## 2013-12-09 MED ORDER — NITROGLYCERIN 1 MG/10 ML FOR IR/CATH LAB
INTRA_ARTERIAL | Status: AC
  Filled 2013-12-09: qty 10

## 2013-12-09 MED ORDER — VERAPAMIL HCL 2.5 MG/ML IV SOLN
INTRAVENOUS | Status: AC
  Filled 2013-12-09: qty 2

## 2013-12-09 MED ORDER — SODIUM CHLORIDE 0.9 % IJ SOLN: 3.0000 mL | INTRAMUSCULAR | Status: DC | PRN

## 2013-12-09 NOTE — H&P (View-Only) (Signed)
Patient ID: Carl Knapp, male   DOB: [birth year], 68 y.o.   MRN: [id number]    [address] [place], [place]  [address] Phone: [phone] Fax:  [phone]  Date:  11/24/2013   ID:  Carl Knapp, DOB 06/10/1945, MRN [id number]  PCP:  Vena Austria, MD   ASSESSMENT:   1. 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 Hypertension 3. Hyperlipidemia 4. Diabetes mellitus  PLAN:  1. Stress myocardial perfusion study as outlined on the previous note by Richardson Dopp, PA 2. If normal or low risk study, manage angina pectoris with medical therapy by adding a beta blocker. A beta blocker therapy does not suppress symptoms within the first 2 coronary angiography to define anatomy. If by Peterson Ao perfusion study is moderate to high risk, he should have immediate coronary angiography to define anatomy and help guide therapy 3. Nitroglycerin 0.4 mg sublingually when necessary of rest pain or prolonged pain 4. Followup with Dr. Golden Hurter   SUBJECTIVE: Carl Knapp is a 68 y.o. male who has a six-month history of exertional chest discomfort radiating into the left neck and jaw. The discomfort starts approximately 3/4 of bowel into a wall be gradual incline when he is out with his daughter. He began noticing this in October. He has not had discomfort at rest. The discomfort goes away with rest. He did experience this discomfort during an exercise treadmill test here in the office last week. Stress test without wise unremarkable after 7-1/2 minutes. No EKG changes of ischemia were noted.  He has a 2 year history of diabetes mellitus.   Wt Readings from Last 3 Encounters:  11/24/13 212 lb (96.163 kg)  05/28/13 213 lb 1.9 oz (96.671 kg)  11/21/12 211 lb 3.2 oz (95.8 kg)     Past Medical History  Diagnosis Date  . Hypertension   . DVT (deep venous thrombosis)   . Macular degeneration   . 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 Hyperlipidemia    LDL goal<100  . OSA (obstructive sleep apnea)     Severe w AHI 37/hr now on CPAP at 10cm H2O    Current Outpatient Prescriptions  Medication Sig Dispense Refill  . amLODipine (NORVASC) 10 MG tablet Take 10 mg by mouth daily.      Marland Kitchen aspirin 81 MG tablet Take 81 mg by mouth daily.      Marland Kitchen lisinopril-hydrochlorothiazide (PRINZIDE,ZESTORETIC) 20-12.5 MG per tablet Take 2 tablets by mouth daily.      . metFORMIN (GLUCOPHAGE) 1000 MG tablet Take 1,000 mg by mouth 2 (two) times daily with a meal.      . naproxen sodium (ANAPROX) 550 MG tablet Take 550 mg by mouth as needed (rarely used for pain).      . simvastatin (ZOCOR) 20 MG tablet Take 20 mg by mouth every evening.       No current facility-administered medications for this visit.    Allergies:   No Known Allergies  Social History:  The patient  reports that he quit smoking about 3 years ago. His smoking use included Cigarettes. He smoked 0.00 packs per day. He does not have any smokeless tobacco history on file. He reports that he drinks alcohol. He reports that he does not use illicit drugs.   ROS:  Please see the history of present illness.   Denies claudication, neurological complaints, palpitations, edema, syncope, and tobacco use. He discontinue smoking 2 years ago  to   All other systems reviewed and negative.   OBJECTIVE: VS:  BP 127/58  Pulse 69  Ht 5' 9.5" (1.765 m)  Wt 212 lb (96.163 kg)  BMI 30.87 kg/m2 Well nourished, well developed, in no acute distress, mildly obese HEENT: normal Neck: JVD  flat.  Carotid bruit absent   Cardiac:  normal S1, S2; RRR; no murmur Lungs:  clear to auscultation bilaterally, no wheezing, rhonchi or rales Abd: soft, nontender, no hepatomegaly Ext: Edema absent . Pulses 2+  Skin: warm and dry Neuro:  CNs 2-12 intact, no focal abnormalities noted  EKG:  normal       Signed, Illene Labrador III, MD 11/24/2013 9:27 AM

## 2013-12-09 NOTE — Interval H&P Note (Signed)
History and Physical Interval Note:  12/09/2013 12:36 PM  [name]  has presented today for surgery, with the diagnosis of cp/adnormal ekg  The various methods of treatment have been discussed with the patient and family. After consideration of risks, benefits and other options for treatment, the patient has consented to  Procedure(s): LEFT HEART CATHETERIZATION WITH CORONARY ANGIOGRAM (N/A) as a surgical intervention .  The patient's history has been reviewed, patient examined, no change in status, stable for surgery.  I have reviewed the patient's chart and labs.  Questions were answered to the patient's satisfaction.   Cath Lab Visit (complete for each Cath Lab visit)  Clinical Evaluation Leading to the Procedure:   ACS: No.  Non-ACS:    Anginal Classification: CCS III  Anti-ischemic medical therapy: Minimal Therapy (1 class of medications)  Non-Invasive Test Results: Low-risk stress test findings: cardiac mortality <1%/year  Prior CABG: No previous CABG        Collier Salina [facility] 12/09/2013 12:36 PM

## 2013-12-09 NOTE — Discharge Instructions (Signed)
Radial Site Care °Refer to this sheet in the next few weeks. These instructions provide you with information on caring for yourself after your procedure. Your caregiver may also give you more specific instructions. Your treatment has been planned according to current medical practices, but problems sometimes occur. Call your caregiver if you have any problems or questions after your procedure. °HOME CARE INSTRUCTIONS °· You may shower the day after the procedure. Remove the bandage (dressing) and gently wash the site with plain soap and water. Gently pat the site dry. °· Do not apply powder or lotion to the site. °· Do not submerge the affected site in water for 3 to 5 days. °· Inspect the site at least twice daily. °· Do not flex or bend the affected arm for 24 hours. °· No lifting over 5 pounds (2.3 kg) for 5 days after your procedure. °· Do not drive home if you are discharged the same day of the procedure. Have someone else drive you. °· You may drive 24 hours after the procedure unless otherwise instructed by your caregiver. °· Do not operate machinery or power tools for 24 hours. °· A responsible adult should be with you for the first 24 hours after you arrive home. °What to expect: °· Any bruising will usually fade within 1 to 2 weeks. °· Blood that collects in the tissue (hematoma) may be painful to the touch. It should usually decrease in size and tenderness within 1 to 2 weeks. °SEEK IMMEDIATE MEDICAL CARE IF: °· You have unusual pain at the radial site. °· You have redness, warmth, swelling, or pain at the radial site. °· You have drainage (other than a small amount of blood on the dressing). °· You have chills. °· You have a fever or persistent symptoms for more than 72 hours. °· You have a fever and your symptoms suddenly get worse. °· Your arm becomes pale, cool, tingly, or numb. °· You have heavy bleeding from the site. Hold pressure on the site. °Document Released: 05/27/2010 Document Revised:  07/17/2011 Document Reviewed: 05/27/2010 °ExitCare® Patient Information ©2015 [employer]. This information is not intended to replace advice given to you by your health care provider. Make sure you discuss any questions you have with your health care provider. ° °

## 2013-12-09 NOTE — CV Procedure (Signed)
    Cardiac Catheterization Procedure Note  Name: Carl Knapp MRN: [id number] DOB: 1945/08/02  Procedure: Left Heart Cath, Selective Coronary Angiography, LV angiography  Indication: 68 yo WM with history of exertional chest pain. He has multiple risk factors. Myoview showed normal perfusion but some Ecg changes and patient experienced chest pain on treadmill.   Procedural Details: The right wrist was prepped, draped, and anesthetized with 1% lidocaine. Using the modified Seldinger technique, a 6 French slender sheath was introduced into the right radial artery. 3 mg of verapamil was administered through the sheath, weight-based unfractionated heparin was administered intravenously. Standard Judkins catheters were used for selective coronary angiography and left ventriculography. Catheter exchanges were performed over an exchange length guidewire. There were no immediate procedural complications. A TR band was used for radial hemostasis at the completion of the procedure.  The patient was transferred to the post catheterization recovery area for further monitoring.  Procedural Findings: Hemodynamics: AO 128/69 mean 95 mm Hg LV 129/13 mm Hg  Coronary angiography: Coronary dominance: right  Left mainstem: Normal.  Left anterior descending (LAD): Normal  Left circumflex (LCx): Normal  Right coronary artery (RCA): 20-30% disease in the mid vessel.  Left ventriculography: Left ventricular systolic function is normal, LVEF is estimated at 55-65%, there is no significant mitral regurgitation   Final Conclusions:   1. Mild nonobstructive CAD 2. Normal LV function.  Recommendations: risk factor modification.  Zoeie Ritter [place], [place]  12/09/2013, 12:58 PM

## 2014-01-02 ENCOUNTER — Telehealth: Payer: Self-pay | Admitting: *Deleted

## 2014-01-02 ENCOUNTER — Ambulatory Visit (INDEPENDENT_AMBULATORY_CARE_PROVIDER_SITE_OTHER): Payer: Medicare Other | Admitting: Physician Assistant

## 2014-01-02 ENCOUNTER — Encounter: Payer: Self-pay | Admitting: Physician Assistant

## 2014-01-02 VITALS — BP 132/62 | HR 77 | Ht 69.5 in | Wt 214.0 lb

## 2014-01-02 DIAGNOSIS — R072 Precordial pain: Secondary | ICD-10-CM | POA: Diagnosis not present

## 2014-01-02 DIAGNOSIS — G4733 Obstructive sleep apnea (adult) (pediatric): Secondary | ICD-10-CM | POA: Diagnosis not present

## 2014-01-02 DIAGNOSIS — I2510 Atherosclerotic heart disease of native coronary artery without angina pectoris: Secondary | ICD-10-CM

## 2014-01-02 DIAGNOSIS — I10 Essential (primary) hypertension: Secondary | ICD-10-CM

## 2014-01-02 DIAGNOSIS — I4949 Other premature depolarization: Secondary | ICD-10-CM

## 2014-01-02 DIAGNOSIS — I25119 Atherosclerotic heart disease of native coronary artery with unspecified angina pectoris: Secondary | ICD-10-CM

## 2014-01-02 DIAGNOSIS — E785 Hyperlipidemia, unspecified: Secondary | ICD-10-CM | POA: Diagnosis not present

## 2014-01-02 DIAGNOSIS — I209 Angina pectoris, unspecified: Secondary | ICD-10-CM | POA: Diagnosis not present

## 2014-01-02 DIAGNOSIS — I493 Ventricular premature depolarization: Secondary | ICD-10-CM

## 2014-01-02 LAB — BASIC METABOLIC PANEL
BUN: 19 mg/dL (ref 6–23)
CHLORIDE: 107 meq/L (ref 96–112)
CO2: 27 mEq/L (ref 19–32)
Calcium: 9.3 mg/dL (ref 8.4–10.5)
Creatinine, Ser: 1.2 mg/dL (ref 0.4–1.5)
GFR: 66.69 mL/min (ref >60.00)
GLUCOSE: 151 mg/dL — AB (ref 70–99)
Potassium: 3.3 mEq/L — ABNORMAL LOW (ref 3.5–5.1)
SODIUM: 141 meq/L (ref 135–145)

## 2014-01-02 LAB — TSH: TSH: 0.93 u[IU]/mL (ref 0.35–4.50)

## 2014-01-02 MED ORDER — POTASSIUM CHLORIDE ER 20 MEQ PO TBCR: 20.0000 meq | EXTENDED_RELEASE_TABLET | Freq: Every day | ORAL | Status: DC

## 2014-01-02 MED ORDER — METOPROLOL SUCCINATE ER 25 MG PO TB24: 25.0000 mg | ORAL_TABLET | Freq: Every day | ORAL | Status: DC

## 2014-01-02 NOTE — Telephone Encounter (Signed)
pt notified about lab results and to start K+ 20 meq daily with today's dose only of 40 meq. pt wants to have his lab work when he sees Dr. Radford Pax 9/15, since he will be on vacation.

## 2014-01-02 NOTE — Progress Notes (Signed)
Cardiology Office Note    Date:  01/02/2014   ID:  Carl, Knapp June 24, 1945, MRN [id number]  PCP:  Vena Austria, MD  Cardiologist:  Dr. Fransico Him       History of Present Illness: Carl Knapp is a 68 y.o. male with a history of sleep apnea, diabetes, HTN, HL and family history of CAD. He was referred for exercise treadmill test by his PCP because of exertional chest pain.  He had chest pain on the treadmill and was set up for a nuclear study. This demonstrated normal perfusion.  But he had chest pain suggestive of angina. He was set up for cardiac catheterization. This demonstrated minimal non-obstructive CAD. Medical therapy was recommended. He returns for FU.  He continues to have exertional chest pain with associated L arm and L jaw pain.  He can slow down to reduce his symptoms.  He sometimes feels lightheaded with this.  He also notes palpitations.  His wife is a Therapist, sports at [employer].  She can detect PVCs when she listens to him.  He denies syncope.  He denies orthopnea, PND, edema.    Studies:  - LHC (8/15):  Mid RCA 20-30%, EF 55-65%  - Nuclear (7/15):  Up-sloping ST segment depression, frequent PVCs, normal perfusion, EF 61% >>> cath   Recent Labs/Images: No results found for requested labs within last 365 days.   Wt Readings from Last 3 Encounters:  01/02/14 214 lb (97.07 kg)  12/09/13 210 lb (95.255 kg)  12/09/13 210 lb (95.255 kg)     Past Medical History  Diagnosis Date  . Hypertension   . DVT (deep venous thrombosis)   . Macular degeneration   . 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 Hyperlipidemia     LDL goal<100  . OSA (obstructive sleep apnea)     Severe w AHI 37/hr now on CPAP at 10cm H2O  . Hx of cardiovascular stress test     ETT-Myoview (7/15):  ECG with ST depression; freq PVCs, + chest pain; normal perfusion    Current Outpatient Prescriptions  Medication Sig Dispense Refill  . amLODipine (NORVASC) 10 MG tablet Take 10 mg  by mouth daily.      Marland Kitchen aspirin EC 81 MG tablet Take 81 mg by mouth daily.      Marland Kitchen lisinopril-hydrochlorothiazide (PRINZIDE,ZESTORETIC) 20-12.5 MG per tablet Take 2 tablets by mouth daily.      . metFORMIN (GLUCOPHAGE) 500 MG tablet       . naproxen sodium (ANAPROX) 550 MG tablet Take 550 mg by mouth as needed (rarely used for pain).      . nitroGLYCERIN (NITROSTAT) 0.4 MG SL tablet Place 1 tablet (0.4 mg total) under the tongue every 5 (five) minutes as needed for chest pain.  25 tablet  5  . simvastatin (ZOCOR) 20 MG tablet Take 20 mg by mouth every evening.      . STUDY MEDICATION Take 3 tablets by mouth every morning. Diabetes study via Dr. Valere Dross at [place] (through [facility]). Placebo or study.  Takes 1 tablet every morning from each of Bottles A, B, and C.       No current facility-administered medications for this visit.     Allergies:   Review of patient's allergies indicates no known allergies.   Social History:  The patient  reports that he quit smoking about 3 years ago. His smoking use included Cigarettes. He smoked 0.00 packs per day. He  does not have any smokeless tobacco history on file. He reports that he drinks alcohol. He reports that he does not use illicit drugs.   Family History:  The patient's family history includes AAA (abdominal aortic aneurysm) in his father; CAD in his father; Heart attack in his father.   ROS:  Please see the history of present illness.      All other systems reviewed and negative.   PHYSICAL EXAM: VS:  BP 132/62  Pulse 77  Ht 5' 9.5" (1.765 m)  Wt 214 lb (97.07 kg)  BMI 31.16 kg/m2 Well nourished, well developed, in no acute distress HEENT: normal Neck: no JVD Cardiac:  normal S1, S2; RRR; no murmur Lungs:  clear to auscultation bilaterally, no wheezing, rhonchi or rales Abd: soft, nontender, no hepatomegaly Ext: no edemaright wrist without hematoma or mass  Skin: warm and dry Neuro:  CNs 2-12 intact, no focal abnormalities  noted  EKG:  NSR, HR 77, normal axis, PVCs     ASSESSMENT AND PLAN:  Precordial pain:  Question if he has microvascular ischemia (he is diabetic).  Also question if PVCs playing a role in his symptoms.  Continue Amlodipine.  Add Toprol-XL 25 mg QD.  If no improvement could consider changing to Isosorbide.  Keep FU with Dr. Fransico Him next month.    PVC's:  He is symptomatic.  Check TSH, BMET, 48 hr Holter.  Add Toprol as noted.  CAD:  Minimal by cath.  Add beta blocker.  Continue ASA, statin.    Essential hypertension, benign:  Controlled.   HLD (hyperlipidemia):  Continue statin.  Obstructive sleep apnea (adult) (pediatric):  FU with Dr. Fransico Him as planned.    Disposition:  FU with Dr. Fransico Him next month as planned.    Signed, Carl Knapp, MHS 01/02/2014 9:13 AM    [facility] [place], [place], [place]  [address] Phone: [phone]; Fax: [phone]

## 2014-01-02 NOTE — Patient Instructions (Addendum)
Your physician has recommended that you wear a 48 HOUR holter monitor. Holter monitors are medical devices that record the heart's electrical activity. Doctors most often use these monitors to diagnose arrhythmias. Arrhythmias are problems with the speed or rhythm of the heartbeat. The monitor is a small, portable device. You can wear one while you do your normal daily activities. This is usually used to diagnose what is causing palpitations/syncope (passing out).  Your physician recommends that you return for lab work in: TODAY BMET, TSH  KEEP YOUR FOLLOW UP WITH DR. Radford Pax 01/2014  START TOPROL XL 25 MG 1 TABLET DAILY; NEW RX SENT IN TODAY

## 2014-01-06 ENCOUNTER — Encounter: Payer: Self-pay | Admitting: *Deleted

## 2014-01-06 ENCOUNTER — Encounter (INDEPENDENT_AMBULATORY_CARE_PROVIDER_SITE_OTHER): Payer: Medicare Other

## 2014-01-06 DIAGNOSIS — I4949 Other premature depolarization: Secondary | ICD-10-CM

## 2014-01-06 DIAGNOSIS — I493 Ventricular premature depolarization: Secondary | ICD-10-CM

## 2014-01-06 NOTE — Progress Notes (Signed)
Patient ID: Carl Knapp, male   DOB: Aug 05, 1945, 69 y.o.   MRN: [id number] [facility] 48 hour holter monitor applied to patient.

## 2014-01-13 ENCOUNTER — Telehealth: Payer: Self-pay | Admitting: Cardiology

## 2014-01-13 DIAGNOSIS — I493 Ventricular premature depolarization: Secondary | ICD-10-CM

## 2014-01-13 NOTE — Telephone Encounter (Signed)
Please let patient know that heart monitor showed NSR with frequent extra heart beats from the top and bottom of the heart which are benign.  Please get a 24 hour Holter to assess for PVC load and find out if patient's palpitations are improved after going on Toprol

## 2014-01-14 NOTE — Telephone Encounter (Signed)
lmtrc

## 2014-01-19 NOTE — Telephone Encounter (Signed)
LMTRC

## 2014-01-19 NOTE — Addendum Note (Signed)
Addended by: Lily Kocher on: 01/19/2014 01:32 PM   Modules accepted: Orders

## 2014-01-19 NOTE — Telephone Encounter (Signed)
Pt is aware and feels that the Toprol has helped him with the palpitations.

## 2014-01-19 NOTE — Telephone Encounter (Signed)
Pt wants to have monitor put on tomorrow since he will already be in office. Put request in for that. Will forward to Dr Radford Pax to make aware that Toprol has improved pts Palpitations.

## 2014-01-20 ENCOUNTER — Encounter (INDEPENDENT_AMBULATORY_CARE_PROVIDER_SITE_OTHER): Payer: Medicare Other

## 2014-01-20 ENCOUNTER — Other Ambulatory Visit: Payer: Medicare Other

## 2014-01-20 ENCOUNTER — Encounter: Payer: Self-pay | Admitting: *Deleted

## 2014-01-20 ENCOUNTER — Ambulatory Visit (INDEPENDENT_AMBULATORY_CARE_PROVIDER_SITE_OTHER): Payer: Medicare Other | Admitting: Cardiology

## 2014-01-20 VITALS — BP 118/68 | HR 56 | Ht 70.0 in | Wt 215.0 lb

## 2014-01-20 DIAGNOSIS — I10 Essential (primary) hypertension: Secondary | ICD-10-CM

## 2014-01-20 DIAGNOSIS — E785 Hyperlipidemia, unspecified: Secondary | ICD-10-CM

## 2014-01-20 DIAGNOSIS — I209 Angina pectoris, unspecified: Secondary | ICD-10-CM | POA: Diagnosis not present

## 2014-01-20 DIAGNOSIS — G4733 Obstructive sleep apnea (adult) (pediatric): Secondary | ICD-10-CM | POA: Diagnosis not present

## 2014-01-20 DIAGNOSIS — R002 Palpitations: Secondary | ICD-10-CM

## 2014-01-20 DIAGNOSIS — R079 Chest pain, unspecified: Secondary | ICD-10-CM

## 2014-01-20 DIAGNOSIS — I2510 Atherosclerotic heart disease of native coronary artery without angina pectoris: Secondary | ICD-10-CM | POA: Diagnosis not present

## 2014-01-20 DIAGNOSIS — I493 Ventricular premature depolarization: Secondary | ICD-10-CM

## 2014-01-20 DIAGNOSIS — I4949 Other premature depolarization: Secondary | ICD-10-CM

## 2014-01-20 LAB — BASIC METABOLIC PANEL
BUN: 21 mg/dL (ref 6–23)
CO2: 26 meq/L (ref 19–32)
CREATININE: 1.3 mg/dL (ref 0.4–1.5)
Calcium: 8.7 mg/dL (ref 8.4–10.5)
Chloride: 107 mEq/L (ref 96–112)
GFR: 57.95 mL/min — ABNORMAL LOW (ref >60.00)
Glucose, Bld: 124 mg/dL — ABNORMAL HIGH (ref 70–99)
POTASSIUM: 3.4 meq/L — AB (ref 3.5–5.1)
Sodium: 141 mEq/L (ref 135–145)

## 2014-01-20 NOTE — Patient Instructions (Signed)
Your physician recommends that you continue on your current medications as directed. Please refer to the Current Medication list given to you today.  Your physician has recommended that you wear an event monitor. Event monitors are medical devices that record the heart's electrical activity. Doctors most often [place] these monitors to diagnose arrhythmias. Arrhythmias are problems with the speed or rhythm of the heartbeat. The monitor is a small, portable device. You can wear one while you do your normal daily activities. This is usually used to diagnose what is causing palpitations/syncope (passing out). ( This is instead of the 24 hour monitor pt was supposed to have today)  Your physician recommends that you schedule a follow-up appointment in: 3 months with Dr Radford Pax

## 2014-01-20 NOTE — Addendum Note (Signed)
Addended by: Lily Kocher on: 01/20/2014 09:19 AM   Modules accepted: Orders

## 2014-01-20 NOTE — Addendum Note (Signed)
Addended by: Eulis Foster on: 01/20/2014 09:23 AM   Modules accepted: Orders

## 2014-01-20 NOTE — Progress Notes (Signed)
[facility], [place], [place]  [address] Phone: [phone] Fax:  [phone]  Date:  01/20/2014   ID:  Adit, Riddles [birth year], MRN [id number]  PCP:  Vena Austria, MD  Cardiologist:  Fransico Him, MD     History of Present Illness: Carl Knapp is a 68 y.o. male with a history of sleep apnea, diabetes, HTN, HL and family history of CAD. He was referred for exercise treadmill test by his PCP because of exertional chest pain. He had chest pain on the treadmill and was set up for a nuclear study. This demonstrated normal perfusion. But he had chest pain suggestive of angina. He was set up for cardiac catheterization. This demonstrated minimal non-obstructive CAD. Medical therapy was recommended. He returned for FU in [place] with the PA and still was complaining of CP so Toprol was added for possible microvascular angina. He was also was having PVC's.  He did well with the Toprol but has had some palpitations associated with confusion and dizziness.  He says that it goes away with rest.  This happens a few times a week.  He denies any chest pain, SOB, DOE.  He denies any LE edema   He continues to use his CPAP machine and tolerates it well.  He uses a nasal pillow mask which he tolerates well.  He feels rested in the am and has no daytime sleepiness.     Wt Readings from Last 3 Encounters:  01/20/14 215 lb (97.523 kg)  01/02/14 214 lb (97.07 kg)  12/09/13 210 lb (95.255 kg)     Past Medical History  Diagnosis Date  . Hypertension   . DVT (deep venous thrombosis)   . Macular degeneration   . 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 Hyperlipidemia     LDL goal<100  . OSA (obstructive sleep apnea)     Severe w AHI 37/hr now on CPAP at 10cm H2O  . Hx of cardiovascular stress test     ETT-Myoview (7/15):  ECG with ST depression; freq PVCs, + chest pain; normal perfusion    Current Outpatient Prescriptions  Medication Sig Dispense Refill  .  amLODipine (NORVASC) 10 MG tablet Take 10 mg by mouth daily.      Marland Kitchen aspirin EC 81 MG tablet Take 81 mg by mouth daily.      Marland Kitchen lisinopril-hydrochlorothiazide (PRINZIDE,ZESTORETIC) 20-12.5 MG per tablet Take 2 tablets by mouth daily.      . metFORMIN (GLUCOPHAGE) 500 MG tablet       . metoprolol succinate (TOPROL-XL) 25 MG 24 hr tablet Take 1 tablet (25 mg total) by mouth daily.  90 tablet  3  . naproxen sodium (ANAPROX) 550 MG tablet Take 550 mg by mouth as needed (rarely used for pain).      . nitroGLYCERIN (NITROSTAT) 0.4 MG SL tablet Place 1 tablet (0.4 mg total) under the tongue every 5 (five) minutes as needed for chest pain.  25 tablet  5  . Potassium Chloride ER 20 MEQ TBCR Take 20 mEq by mouth daily.  35 tablet  11  . simvastatin (ZOCOR) 20 MG tablet Take 20 mg by mouth every evening.      . STUDY MEDICATION Take 3 tablets by mouth every morning. Diabetes study via Dr. Valere Dross at [place] (through [facility] Placebo or study.  Takes 1 tablet every morning from each of Bottles A, B, and C.  No current facility-administered medications for this visit.    Allergies:   No Known Allergies  Social History:  The patient  reports that he quit smoking about 3 years ago. His smoking use included Cigarettes. He smoked 0.00 packs per day. He does not have any smokeless tobacco history on file. He reports that he drinks alcohol. He reports that he does not use illicit drugs.   Family History:  The patient's family history includes AAA (abdominal aortic aneurysm) in his father; CAD in his father; Heart attack in his father.   ROS:  Please see the history of present illness.      All other systems reviewed and negative.   PHYSICAL EXAM: VS:  BP 118/68  Pulse 56  Ht 5\' 10"  (1.778 m)  Wt 215 lb (97.523 kg)  BMI 30.85 kg/m2 Well nourished, well developed, in no acute distress HEENT: normal Neck: no JVD Cardiac:  normal S1, S2; RRR; no murmur Lungs:  clear to auscultation  bilaterally, no wheezing, rhonchi or rales Abd: soft, nontender, no hepatomegaly Ext: no edema Skin: warm and dry Neuro:  CNs 2-12 intact, no focal abnormalities noted   ASSESSMENT AND PLAN:  1.  Chest pain - resolved with addition of Toprol - Continue Amlodipine/ Toprol-XL/ASA 2.  PVC's: He is symptomatic from them.  Recent Holter showed NSR with frequent PVC's and nonsustained atrial tach up to 4 beats.  He is still having palpitations some of which are bad associated with dizziness and mental confusion.  His wife has checked his BP during these episodes and it is fine.  His potassium was low on last check and was repleted and he has a BMET pending today.   - I will get a 30 day heart monitor to assess for arrhythmias other than PVC's 3.  CAD: Minimal by cath. Continue ASA, statin, BB 4.  Essential hypertension, benign: Controlled. Continue ACE I , amlodipine and BB.  BMET pending this am 5.  HLD (hyperlipidemia): Continue statin and study drug - lipids checked by Dr. Valere Dross with study drug 6.  Obstructive sleep apnea (adult) (pediatric): continue with current CPAP settings.  His last download showed an AHI of 1.6/hr and 99% compliance on using more than 4 hours nightly  Followup with me in 3 months   Signed, Fransico Him, MD 01/20/2014 9:00 AM

## 2014-01-20 NOTE — Progress Notes (Signed)
Patient ID: Carl Knapp, male   DOB: Oct 01, 1945, 68 y.o.   MRN: [id number] [employer] 30 day cardiac event monitor applied to patient.

## 2014-01-21 ENCOUNTER — Encounter: Payer: Self-pay | Admitting: Cardiology

## 2014-01-21 ENCOUNTER — Telehealth: Payer: Self-pay | Admitting: *Deleted

## 2014-01-21 DIAGNOSIS — I10 Essential (primary) hypertension: Secondary | ICD-10-CM

## 2014-01-21 MED ORDER — POTASSIUM CHLORIDE ER 20 MEQ PO TBCR: 40.0000 meq | EXTENDED_RELEASE_TABLET | Freq: Every day | ORAL | Status: DC

## 2014-01-21 NOTE — Telephone Encounter (Signed)
pt notified about lab results and to increase K+ to 40 meq daily; new Rx sent in to [facility]. BMET 01/30/14. Pt verbalized understanding to Plan of Care.

## 2014-01-30 ENCOUNTER — Other Ambulatory Visit (INDEPENDENT_AMBULATORY_CARE_PROVIDER_SITE_OTHER): Payer: Medicare Other

## 2014-01-30 DIAGNOSIS — I10 Essential (primary) hypertension: Secondary | ICD-10-CM

## 2014-01-30 LAB — BASIC METABOLIC PANEL
BUN: 20 mg/dL (ref 6–23)
CHLORIDE: 108 meq/L (ref 96–112)
CO2: 24 meq/L (ref 19–32)
Calcium: 8.7 mg/dL (ref 8.4–10.5)
Creatinine, Ser: 1.2 mg/dL (ref 0.4–1.5)
GFR: 61.73 mL/min (ref >60.00)
GLUCOSE: 115 mg/dL — AB (ref 70–99)
POTASSIUM: 3.8 meq/L (ref 3.5–5.1)
SODIUM: 142 meq/L (ref 135–145)

## 2014-02-04 ENCOUNTER — Telehealth: Payer: Self-pay | Admitting: *Deleted

## 2014-02-04 NOTE — Telephone Encounter (Signed)
pt notified about lab results with verbal understanding  

## 2014-02-13 DIAGNOSIS — J309 Allergic rhinitis, unspecified: Secondary | ICD-10-CM | POA: Diagnosis not present

## 2014-02-13 DIAGNOSIS — H3530 Unspecified macular degeneration: Secondary | ICD-10-CM | POA: Diagnosis not present

## 2014-02-13 DIAGNOSIS — I10 Essential (primary) hypertension: Secondary | ICD-10-CM | POA: Diagnosis not present

## 2014-02-13 DIAGNOSIS — R197 Diarrhea, unspecified: Secondary | ICD-10-CM | POA: Diagnosis not present

## 2014-02-13 DIAGNOSIS — E780 Pure hypercholesterolemia: Secondary | ICD-10-CM | POA: Diagnosis not present

## 2014-02-13 DIAGNOSIS — K219 Gastro-esophageal reflux disease without esophagitis: Secondary | ICD-10-CM | POA: Diagnosis not present

## 2014-02-13 DIAGNOSIS — E119 Type 2 diabetes mellitus without complications: Secondary | ICD-10-CM | POA: Diagnosis not present

## 2014-02-13 DIAGNOSIS — G4733 Obstructive sleep apnea (adult) (pediatric): Secondary | ICD-10-CM | POA: Diagnosis not present

## 2014-02-24 ENCOUNTER — Telehealth: Payer: Self-pay | Admitting: Cardiology

## 2014-02-24 NOTE — Telephone Encounter (Signed)
I left a message for the patient to call. 

## 2014-02-24 NOTE — Telephone Encounter (Signed)
Please let patient know that heart monitor showed sinus bradycardia to NSR with average HR 58bpm and lowest HR 52bpm with occasional PVC's which are benign

## 2014-02-25 ENCOUNTER — Telehealth: Payer: Self-pay | Admitting: *Deleted

## 2014-02-25 NOTE — Telephone Encounter (Signed)
Patient called. Gave monitor results per Dr. Theodosia Blender note. Pt verbalizes understanding.

## 2014-03-03 NOTE — Telephone Encounter (Signed)
Completed in 10/21 phone note.

## 2014-03-30 DIAGNOSIS — H52223 Regular astigmatism, bilateral: Secondary | ICD-10-CM | POA: Diagnosis not present

## 2014-03-30 DIAGNOSIS — H5203 Hypermetropia, bilateral: Secondary | ICD-10-CM | POA: Diagnosis not present

## 2014-03-30 DIAGNOSIS — E119 Type 2 diabetes mellitus without complications: Secondary | ICD-10-CM | POA: Diagnosis not present

## 2014-03-30 DIAGNOSIS — H524 Presbyopia: Secondary | ICD-10-CM | POA: Diagnosis not present

## 2014-04-14 DIAGNOSIS — Z23 Encounter for immunization: Secondary | ICD-10-CM | POA: Diagnosis not present

## 2014-04-14 DIAGNOSIS — E780 Pure hypercholesterolemia: Secondary | ICD-10-CM | POA: Diagnosis not present

## 2014-04-14 DIAGNOSIS — I25119 Atherosclerotic heart disease of native coronary artery with unspecified angina pectoris: Secondary | ICD-10-CM | POA: Diagnosis not present

## 2014-04-14 DIAGNOSIS — I10 Essential (primary) hypertension: Secondary | ICD-10-CM | POA: Diagnosis not present

## 2014-04-14 DIAGNOSIS — E119 Type 2 diabetes mellitus without complications: Secondary | ICD-10-CM | POA: Diagnosis not present

## 2014-04-16 ENCOUNTER — Encounter (HOSPITAL_COMMUNITY): Payer: Self-pay | Admitting: Interventional Cardiology

## 2014-04-21 ENCOUNTER — Encounter: Payer: Self-pay | Admitting: Cardiology

## 2014-04-21 ENCOUNTER — Ambulatory Visit (INDEPENDENT_AMBULATORY_CARE_PROVIDER_SITE_OTHER): Payer: Medicare Other | Admitting: Cardiology

## 2014-04-21 VITALS — BP 132/76 | HR 58 | Ht 70.0 in | Wt 218.8 lb

## 2014-04-21 DIAGNOSIS — E785 Hyperlipidemia, unspecified: Secondary | ICD-10-CM | POA: Diagnosis not present

## 2014-04-21 DIAGNOSIS — I2583 Coronary atherosclerosis due to lipid rich plaque: Principal | ICD-10-CM

## 2014-04-21 DIAGNOSIS — I493 Ventricular premature depolarization: Secondary | ICD-10-CM

## 2014-04-21 DIAGNOSIS — E669 Obesity, unspecified: Secondary | ICD-10-CM

## 2014-04-21 DIAGNOSIS — I2510 Atherosclerotic heart disease of native coronary artery without angina pectoris: Secondary | ICD-10-CM | POA: Diagnosis not present

## 2014-04-21 DIAGNOSIS — I10 Essential (primary) hypertension: Secondary | ICD-10-CM

## 2014-04-21 MED ORDER — POTASSIUM CHLORIDE ER 20 MEQ PO TBCR: 40.0000 meq | EXTENDED_RELEASE_TABLET | Freq: Every day | ORAL | Status: DC

## 2014-04-21 MED ORDER — METOPROLOL SUCCINATE ER 25 MG PO TB24: 25.0000 mg | ORAL_TABLET | Freq: Every day | ORAL | Status: DC

## 2014-04-21 NOTE — Progress Notes (Signed)
[facility], [place], [place]  [address] Phone: [phone] Fax:  [phone]  Date:  04/21/2014   ID:  Dabid, [place] [birth year], MRN [id number]  PCP:  Vena Austria, MD  Cardiologist:  Fransico Him, MD    History of Present Illness: Carl Knapp is a 68 y.o. male with a history of sleep apnea, diabetes, HTN, HL, nonobstructive CAD and PVC's.  He presents today for followup.   He denies any chest pain, SOB, DOE. He denies any LE edema.  He went hiking with his daughter last weekend and did well with no CP.   He continues to use his CPAP machine and tolerates it well. He uses a nasal pillow mask which he tolerates well. He feels rested in the am and has no daytime sleepiness.     Wt Readings from Last 3 Encounters:  04/21/14 218 lb 12.8 oz (99.247 kg)  01/20/14 215 lb (97.523 kg)  01/02/14 214 lb (97.07 kg)     Past Medical History  Diagnosis Date  . Hypertension   . DVT (deep venous thrombosis)   . Macular degeneration   . 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 Hyperlipidemia     LDL goal<100  . OSA (obstructive sleep apnea)     Severe w AHI 37/hr now on CPAP at 10cm H2O  . Hx of cardiovascular stress test     ETT-Myoview (7/15):  ECG with ST depression; freq PVCs, + chest pain; normal perfusion    Current Outpatient Prescriptions  Medication Sig Dispense Refill  . amLODipine (NORVASC) 10 MG tablet Take 10 mg by mouth daily.    Marland Kitchen aspirin EC 81 MG tablet Take 81 mg by mouth daily.    Marland Kitchen lisinopril-hydrochlorothiazide (PRINZIDE,ZESTORETIC) 20-12.5 MG per tablet Take 2 tablets by mouth daily.    . metFORMIN (GLUCOPHAGE) 500 MG tablet     . metoprolol succinate (TOPROL-XL) 25 MG 24 hr tablet Take 1 tablet (25 mg total) by mouth daily. 90 tablet 3  . naproxen sodium (ANAPROX) 550 MG tablet Take 550 mg by mouth as needed (rarely used for pain).    . nitroGLYCERIN (NITROSTAT) 0.4 MG SL tablet Place 1 tablet (0.4 mg total) under the  tongue every 5 (five) minutes as needed for chest pain. 25 tablet 5  . Potassium Chloride ER 20 MEQ TBCR Take 40 mEq by mouth daily. 60 tablet 11  . simvastatin (ZOCOR) 20 MG tablet Take 20 mg by mouth every evening.    . STUDY MEDICATION Take 3 tablets by mouth every morning. Diabetes study via Dr. Valere Dross at [facility] (through [facility]). Placebo or study.  Takes 1 tablet every morning from each of Bottles A, B, and C.     No current facility-administered medications for this visit.    Allergies:   No Known Allergies  Social History:  The patient  reports that he quit smoking about 3 years ago. His smoking use included Cigarettes. He smoked 0.00 packs per day. He does not have any smokeless tobacco history on file. He reports that he drinks alcohol. He reports that he does not use illicit drugs.   Family History:  The patient's family history includes AAA (abdominal aortic aneurysm) in his father; CAD in his father; Heart attack in his father.   ROS:  Please see the history of present illness.      All other systems reviewed and negative.   PHYSICAL EXAM: VS:  BP 132/76 mmHg  Pulse 58  Ht 5\' 10"  (1.778 m)  Wt 218 lb 12.8 oz (99.247 kg)  BMI 31.39 kg/m2  SpO2 97% Well nourished, well developed, in no acute distress HEENT: normal Neck: no JVD Cardiac:  normal S1, S2; RRR; no murmur Lungs:  clear to auscultation bilaterally, no wheezing, rhonchi or rales Abd: soft, nontender, no hepatomegaly Ext: no edema Skin: warm and dry Neuro:  CNs 2-12 intact, no focal abnormalities noted     ASSESSMENT AND PLAN:  1. Chest pain possibly due to microvascular angina - resolved with addition of Toprol - Continue Amlodipine/ Toprol-XL/ASA 2. PVC's/nonsustained atrial tachycardia 3. Nonobstructive ASCAD: Minimal by cath. Continue ASA, statin, BB 4. Essential hypertension, benign: Controlled. Continue ACE I , amlodipine and BB.  5. HLD (hyperlipidemia): Continue statin and study  drug - lipids at goal with LDL 74 with recent blood work with PCP 6. Obstructive sleep apnea (adult) (pediatric): continue with current CPAP settings. His last download showed an AHI of 1.5/hr and 98% compliance on using more than 4 hours nightly  Followup with me in 6 months  Signed, Fransico Him, MD [facility] 04/21/2014 8:16 AM

## 2014-04-21 NOTE — Patient Instructions (Signed)
Your physician wants you to follow-up in: 6 months with Dr. Radford Pax. You will receive a reminder letter in the mail two months in advance. If you don't receive a letter, please call our office to schedule the follow-up appointment.

## 2014-07-16 ENCOUNTER — Encounter: Payer: Self-pay | Admitting: Cardiology

## 2014-07-16 DIAGNOSIS — J069 Acute upper respiratory infection, unspecified: Secondary | ICD-10-CM | POA: Diagnosis not present

## 2014-10-23 ENCOUNTER — Ambulatory Visit (INDEPENDENT_AMBULATORY_CARE_PROVIDER_SITE_OTHER): Payer: Medicare Other | Admitting: Cardiology

## 2014-10-23 ENCOUNTER — Encounter: Payer: Self-pay | Admitting: Cardiology

## 2014-10-23 VITALS — BP 136/80 | HR 70 | Ht 70.0 in | Wt 217.0 lb

## 2014-10-23 DIAGNOSIS — E669 Obesity, unspecified: Secondary | ICD-10-CM

## 2014-10-23 DIAGNOSIS — I10 Essential (primary) hypertension: Secondary | ICD-10-CM

## 2014-10-23 DIAGNOSIS — E785 Hyperlipidemia, unspecified: Secondary | ICD-10-CM

## 2014-10-23 DIAGNOSIS — G4733 Obstructive sleep apnea (adult) (pediatric): Secondary | ICD-10-CM

## 2014-10-23 DIAGNOSIS — I2510 Atherosclerotic heart disease of native coronary artery without angina pectoris: Secondary | ICD-10-CM

## 2014-10-23 DIAGNOSIS — I2583 Coronary atherosclerosis due to lipid rich plaque: Principal | ICD-10-CM

## 2014-10-23 NOTE — Patient Instructions (Signed)

## 2014-10-23 NOTE — Progress Notes (Addendum)
Cardiology Office Note   Date:  10/24/2014   ID:  Carl Knapp, DOB 04-26-1946, MRN [id number]  PCP:  Vena Austria, MD    Chief Complaint  Patient presents with  . Chest Pain  . Coronary Artery Disease  . Sleep Apnea      History of Present Illness: Carl Knapp is a 69 y.o. male with a history of sleep apnea, diabetes, HTN, HL, nonobstructive CAD and PVC's. He presents today for followup.  He denies any chest pain, SOB, DOE, dizziness, palpitations or syncope.  Carl Knapp He denies any LE edema.He continues to use his CPAP machine and tolerates it well. He uses a nasal pillow mask which he tolerates well. He feels rested in the am and has no daytime sleepiness.He sleeps very well at night.  He denies any mouth or nasal dryness and no head congestion other than with his allergies.    Past Medical History  Diagnosis Date  . Hypertension   . DVT (deep venous thrombosis)   . Macular degeneration   . 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 Hyperlipidemia     LDL goal<100  . OSA (obstructive sleep apnea)     Severe w AHI 37/hr now on CPAP at 10cm H2O  . Hx of cardiovascular stress test     ETT-Myoview (7/15):  ECG with ST depression; freq PVCs, + chest pain; normal perfusion  . Coronary artery disease 12/2013    20-30% RCA by cath    Past Surgical History  Procedure Laterality Date  . Left heart catheterization with coronary angiogram N/A 12/09/2013    Procedure: LEFT HEART CATHETERIZATION WITH CORONARY ANGIOGRAM;  Surgeon: Jettie Booze, MD;  Location: [facility] CATH LAB;  Service: Cardiovascular;  Laterality: N/A;     Current Outpatient Prescriptions  Medication Sig Dispense Refill  . amLODipine (NORVASC) 10 MG tablet Take 10 mg by mouth daily.    Carl Knapp aspirin EC 81 MG tablet Take 81 mg by mouth daily.    Carl Knapp lisinopril-hydrochlorothiazide (PRINZIDE,ZESTORETIC) 20-12.5 MG per tablet Take 2 tablets by mouth daily.    . metFORMIN  (GLUCOPHAGE) 500 MG tablet     . metoprolol succinate (TOPROL-XL) 25 MG 24 hr tablet Take 1 tablet (25 mg total) by mouth daily. 90 tablet 3  . naproxen sodium (ANAPROX) 550 MG tablet Take 550 mg by mouth as needed (rarely used for pain).    . nitroGLYCERIN (NITROSTAT) 0.4 MG SL tablet Place 1 tablet (0.4 mg total) under the tongue every 5 (five) minutes as needed for chest pain. 25 tablet 5  . Potassium Chloride ER 20 MEQ TBCR Take 40 mEq by mouth daily. 180 tablet 3  . simvastatin (ZOCOR) 20 MG tablet Take 20 mg by mouth every evening.    . STUDY MEDICATION Take 3 tablets by mouth every morning. Diabetes study via Dr. Valere Dross at [place] (through [facility]). Placebo or study.  Takes 1 tablet every morning from each of Bottles A, B, and C.     No current facility-administered medications for this visit.    Allergies:   Review of patient's allergies indicates no known allergies.    Social History:  The patient  reports that he quit smoking about 4 years ago. His smoking use included Cigarettes. He does not have any smokeless tobacco history on file. He reports that he drinks alcohol. He reports that  he does not use illicit drugs.   Family History:  The patient's family history includes AAA (abdominal aortic aneurysm) in his father; CAD in his father; Heart attack in his father.    ROS:  Please see the history of present illness.   Otherwise, review of systems are positive for none.   All other systems are reviewed and negative.    PHYSICAL EXAM: VS:  BP 136/80 mmHg  Pulse 70  Ht 5\' 10"  (1.778 m)  Wt 217 lb (98.431 kg)  BMI 31.14 kg/m2 , BMI Body mass index is 31.14 kg/(m^2). GEN: Well nourished, well developed, in no acute distress HEENT: normal Neck: no JVD, carotid bruits, or masses Cardiac: RRR; no murmurs, rubs, or gallops,no edema  Respiratory:  clear to auscultation bilaterally, normal work of breathing GI: soft, nontender, nondistended, + BS MS: no deformity or  atrophy Skin: warm and dry, no rash Neuro:  Strength and sensation are intact Psych: euthymic mood, full affect   EKG:  EKG is not ordered today.    Recent Labs: 01/02/2014: TSH 0.93 01/30/2014: BUN 20; Creatinine, Ser 1.2; Potassium 3.8; Sodium 142    Lipid Panel No results found for: CHOL, TRIG, HDL, CHOLHDL, VLDL, LDLCALC, LDLDIRECT    Wt Readings from Last 3 Encounters:  10/23/14 217 lb (98.431 kg)  04/21/14 218 lb 12.8 oz (99.247 kg)  01/20/14 215 lb (97.523 kg)     ASSESSMENT AND PLAN:  1. Chest pain possibly due to microvascular angina - resolved with addition of Toprol - Continue Amlodipine/ Toprol-XL/ASA 2. PVC's/nonsustained atrial tachycardia - asymptomatic on BB 3. Nonobstructive ASCAD: Minimal by cath. Continue ASA, statin, BB 4. Essential hypertension, benign: Controlled. Continue ACE I , amlodipine, diuretic and BB 5. HLD (hyperlipidemia): Continue statin.  I will get an FLP and ALT from his PCP 6. Obstructive sleep apnea (adult): continue with current CPAP settings. His download showed an AHI of 1.3/hr on 10cm H2O and 92% compliance in using more than 4 hours nightly    Current medicines are reviewed at length with the patient today.  The patient does not have concerns regarding medicines.  The following changes have been made:  no change  Labs/ tests ordered today: See above Assessment and Plan No orders of the defined types were placed in this encounter.     Disposition:   FU with me in 6 months  Signed, Sueanne Margarita, MD  10/24/2014 5:13 PM    [facility] [place], [place], [place]  [address] Phone: [phone]; Fax: [phone]

## 2014-10-24 ENCOUNTER — Encounter: Payer: Self-pay | Admitting: Cardiology

## 2014-10-24 NOTE — Addendum Note (Signed)
Addended by: Fransico Him R on: 10/24/2014 05:14 PM   Modules accepted: Miquel Dunn

## 2014-10-26 DIAGNOSIS — Z1389 Encounter for screening for other disorder: Secondary | ICD-10-CM | POA: Diagnosis not present

## 2014-10-26 DIAGNOSIS — I10 Essential (primary) hypertension: Secondary | ICD-10-CM | POA: Diagnosis not present

## 2014-10-26 DIAGNOSIS — E119 Type 2 diabetes mellitus without complications: Secondary | ICD-10-CM | POA: Diagnosis not present

## 2014-10-26 DIAGNOSIS — E780 Pure hypercholesterolemia: Secondary | ICD-10-CM | POA: Diagnosis not present

## 2014-10-26 DIAGNOSIS — Z0000 Encounter for general adult medical examination without abnormal findings: Secondary | ICD-10-CM | POA: Diagnosis not present

## 2014-10-26 DIAGNOSIS — I25119 Atherosclerotic heart disease of native coronary artery with unspecified angina pectoris: Secondary | ICD-10-CM | POA: Diagnosis not present

## 2014-11-02 ENCOUNTER — Other Ambulatory Visit: Payer: Self-pay

## 2014-11-05 ENCOUNTER — Encounter: Payer: Self-pay | Admitting: Cardiology

## 2014-11-18 DIAGNOSIS — R5383 Other fatigue: Secondary | ICD-10-CM | POA: Diagnosis not present

## 2014-11-18 DIAGNOSIS — M2550 Pain in unspecified joint: Secondary | ICD-10-CM | POA: Diagnosis not present

## 2014-11-18 DIAGNOSIS — L259 Unspecified contact dermatitis, unspecified cause: Secondary | ICD-10-CM | POA: Diagnosis not present

## 2014-11-18 DIAGNOSIS — E860 Dehydration: Secondary | ICD-10-CM | POA: Diagnosis not present

## 2015-02-25 DIAGNOSIS — Z23 Encounter for immunization: Secondary | ICD-10-CM | POA: Diagnosis not present

## 2015-03-01 DIAGNOSIS — R0602 Shortness of breath: Secondary | ICD-10-CM | POA: Diagnosis not present

## 2015-03-01 DIAGNOSIS — H6121 Impacted cerumen, right ear: Secondary | ICD-10-CM | POA: Diagnosis not present

## 2015-03-01 DIAGNOSIS — R072 Precordial pain: Secondary | ICD-10-CM | POA: Diagnosis not present

## 2015-03-01 DIAGNOSIS — J069 Acute upper respiratory infection, unspecified: Secondary | ICD-10-CM | POA: Diagnosis not present

## 2015-03-03 DIAGNOSIS — R072 Precordial pain: Secondary | ICD-10-CM | POA: Diagnosis not present

## 2015-03-10 DIAGNOSIS — R072 Precordial pain: Secondary | ICD-10-CM | POA: Diagnosis not present

## 2015-03-10 DIAGNOSIS — R0602 Shortness of breath: Secondary | ICD-10-CM | POA: Diagnosis not present

## 2015-04-14 DIAGNOSIS — E1165 Type 2 diabetes mellitus with hyperglycemia: Secondary | ICD-10-CM | POA: Diagnosis not present

## 2015-04-14 DIAGNOSIS — E7800 Pure hypercholesterolemia, unspecified: Secondary | ICD-10-CM | POA: Diagnosis not present

## 2015-04-14 DIAGNOSIS — F4320 Adjustment disorder, unspecified: Secondary | ICD-10-CM | POA: Diagnosis not present

## 2015-04-14 DIAGNOSIS — K429 Umbilical hernia without obstruction or gangrene: Secondary | ICD-10-CM | POA: Diagnosis not present

## 2015-04-14 DIAGNOSIS — I25119 Atherosclerotic heart disease of native coronary artery with unspecified angina pectoris: Secondary | ICD-10-CM | POA: Diagnosis not present

## 2015-04-14 DIAGNOSIS — I10 Essential (primary) hypertension: Secondary | ICD-10-CM | POA: Diagnosis not present

## 2015-04-28 DIAGNOSIS — H35363 Drusen (degenerative) of macula, bilateral: Secondary | ICD-10-CM | POA: Diagnosis not present

## 2015-04-28 DIAGNOSIS — H353132 Nonexudative age-related macular degeneration, bilateral, intermediate dry stage: Secondary | ICD-10-CM | POA: Diagnosis not present

## 2015-04-28 DIAGNOSIS — Z7984 Long term (current) use of oral hypoglycemic drugs: Secondary | ICD-10-CM | POA: Diagnosis not present

## 2015-04-28 DIAGNOSIS — H2513 Age-related nuclear cataract, bilateral: Secondary | ICD-10-CM | POA: Diagnosis not present

## 2015-04-28 DIAGNOSIS — E119 Type 2 diabetes mellitus without complications: Secondary | ICD-10-CM | POA: Diagnosis not present

## 2015-05-12 DIAGNOSIS — K429 Umbilical hernia without obstruction or gangrene: Secondary | ICD-10-CM | POA: Diagnosis not present

## 2015-05-12 DIAGNOSIS — G4733 Obstructive sleep apnea (adult) (pediatric): Secondary | ICD-10-CM | POA: Diagnosis not present

## 2015-05-13 ENCOUNTER — Other Ambulatory Visit: Payer: Self-pay | Admitting: Surgery

## 2015-05-17 ENCOUNTER — Encounter: Payer: Self-pay | Admitting: Cardiology

## 2015-05-17 ENCOUNTER — Ambulatory Visit: Payer: Medicare Other | Admitting: Cardiology

## 2015-05-18 ENCOUNTER — Ambulatory Visit (INDEPENDENT_AMBULATORY_CARE_PROVIDER_SITE_OTHER): Payer: Medicare Other | Admitting: Cardiology

## 2015-05-18 ENCOUNTER — Encounter: Payer: Self-pay | Admitting: Cardiology

## 2015-05-18 VITALS — BP 118/70 | HR 87 | Ht 70.0 in | Wt 214.2 lb

## 2015-05-18 DIAGNOSIS — G4733 Obstructive sleep apnea (adult) (pediatric): Secondary | ICD-10-CM

## 2015-05-18 DIAGNOSIS — E669 Obesity, unspecified: Secondary | ICD-10-CM

## 2015-05-18 DIAGNOSIS — E785 Hyperlipidemia, unspecified: Secondary | ICD-10-CM

## 2015-05-18 DIAGNOSIS — I10 Essential (primary) hypertension: Secondary | ICD-10-CM | POA: Diagnosis not present

## 2015-05-18 DIAGNOSIS — I2510 Atherosclerotic heart disease of native coronary artery without angina pectoris: Secondary | ICD-10-CM

## 2015-05-18 DIAGNOSIS — I2583 Coronary atherosclerosis due to lipid rich plaque: Principal | ICD-10-CM

## 2015-05-18 NOTE — Progress Notes (Signed)
Cardiology Office Note   Date:  05/18/2015   ID:  Carl Knapp, DOB Jul 31, 1945, MRN [id number]  PCP:  Carl Austria, MD    Chief Complaint  Patient presents with  . Coronary Artery Disease  . Hypertension  . Sleep Apnea      History of Present Illness: Carl Knapp is a 70 y.o. male with a history of sleep apnea, diabetes, HTN, HL, nonobstructive CAD and PVC's. He presents today for followup.  He denies any chest pain, SOB, DOE, dizziness, palpitations or syncope.  He denies any LE edema.He continues to use his CPAP machine and tolerates it well. He uses a nasal pillow mask which he tolerates well. He feels rested in the am and has no daytime sleepiness.He sleeps very well at night. He denies any mouth or nasal dryness and no head congestion other than with his allergies.     Past Medical History  Diagnosis Date  . Hypertension   . DVT (deep venous thrombosis) ([place])   . Macular degeneration   . Diabetes mellitus without complication ([place])     type II  . Hyperlipidemia     LDL goal<100  . OSA (obstructive sleep apnea)     Severe w AHI 37/hr now on CPAP at 10cm H2O  . Hx of cardiovascular stress test     ETT-Myoview (7/15):  ECG with ST depression; freq PVCs, + chest pain; normal perfusion  . Coronary artery disease 12/2013    20-30% RCA by cath    Past Surgical History  Procedure Laterality Date  . Left heart catheterization with coronary angiogram N/A 12/09/2013    Procedure: LEFT HEART CATHETERIZATION WITH CORONARY ANGIOGRAM;  Surgeon: Jettie Booze, MD;  Location: [facility] CATH LAB;  Service: Cardiovascular;  Laterality: N/A;     Current Outpatient Prescriptions  Medication Sig Dispense Refill  . amLODipine (NORVASC) 10 MG tablet Take 10 mg by mouth daily.    Marland Kitchen aspirin EC 81 MG tablet Take 81 mg by mouth daily.    Marland Kitchen lisinopril-hydrochlorothiazide (PRINZIDE,ZESTORETIC) 20-12.5 MG per tablet Take 2 tablets by mouth daily.    .  metFORMIN (GLUCOPHAGE) 1000 MG tablet Take 1,000 mg by mouth 2 (two) times daily with a meal.     . naproxen sodium (ANAPROX) 550 MG tablet Take 550 mg by mouth as needed (rarely used for pain).    . nitroGLYCERIN (NITROSTAT) 0.4 MG SL tablet Place 1 tablet (0.4 mg total) under the tongue every 5 (five) minutes as needed for chest pain. 25 tablet 5  . Potassium Chloride ER 20 MEQ TBCR Take 40 mEq by mouth daily. 180 tablet 3  . simvastatin (ZOCOR) 20 MG tablet Take 20 mg by mouth every evening.     No current facility-administered medications for this visit.    Allergies:   Review of patient's allergies indicates no known allergies.    Social History:  The patient  reports that he quit smoking about 4 years ago. His smoking use included Cigarettes. He does not have any smokeless tobacco history on file. He reports that he drinks alcohol. He reports that he does not use illicit drugs.   Family History:  The patient's family history includes AAA (abdominal aortic aneurysm) in his father; CAD in his father; Heart attack in his father.    ROS:  Please see the history of present illness.   Otherwise, review of systems  are positive for none.   All other systems are reviewed and negative.    PHYSICAL EXAM: VS:  BP 118/70 mmHg  Pulse 87  Ht 5\' 10"  (1.778 m)  Wt 214 lb 3.2 oz (97.16 kg)  BMI 30.73 kg/m2  SpO2 98% , BMI Body mass index is 30.73 kg/(m^2). GEN: Well nourished, well developed, in no acute distress HEENT: normal Neck: no JVD, carotid bruits, or masses Cardiac: RRR; no murmurs, rubs, or gallops,no edema  Respiratory:  clear to auscultation bilaterally, normal work of breathing GI: soft, nontender, nondistended, + BS MS: no deformity or atrophy Skin: warm and dry, no rash Neuro:  Strength and sensation are intact Psych: euthymic mood, full affect   EKG:  EKG is not ordered today.    Recent Labs: No results found for requested labs within last 365 days.    Lipid  Panel No results found for: CHOL, TRIG, HDL, CHOLHDL, VLDL, LDLCALC, LDLDIRECT    Wt Readings from Last 3 Encounters:  05/18/15 214 lb 3.2 oz (97.16 kg)  10/23/14 217 lb (98.431 kg)  04/21/14 218 lb 12.8 oz (99.247 kg)    ASSESSMENT AND PLAN:  1. Chest pain possibly due to microvascular angina - resolved with addition of Toprol with no further reoccurrence.   - Continue Amlodipine/ Toprol-XL/ASA 2. PVC's/nonsustained atrial tachycardia - asymptomatic on BB 3. Nonobstructive ASCAD: Minimal by cath. Continue ASA, statin, BB 4. Essential hypertension, benign: Controlled. Continue ACE I , amlodipine, diuretic and BB 5. HLD (hyperlipidemia): Continue statin. I will get an FLP and ALT from his PCP 6. Obstructive sleep apnea (adult): continue with current CPAP settings. His download showed an AHI of 1.9/hr on 11cm H2O and 72% compliance in using more than 4 hours nightly   Current medicines are reviewed at length with the patient today.  The patient does not have concerns regarding medicines.  The following changes have been made:  no change  Labs/ tests ordered today: See above Assessment and Plan No orders of the defined types were placed in this encounter.     Disposition:   FU with me in 1 year  Signed, Sueanne Margarita, MD  05/18/2015 2:56 PM    [facility], [place], [place]  [address] Phone: [phone]; Fax: [phone]

## 2015-05-18 NOTE — Patient Instructions (Signed)

## 2015-05-20 ENCOUNTER — Encounter: Payer: Medicare Other | Attending: Family Medicine | Admitting: *Deleted

## 2015-05-20 ENCOUNTER — Encounter: Payer: Self-pay | Admitting: *Deleted

## 2015-05-20 VITALS — Ht 70.0 in | Wt 213.6 lb

## 2015-05-20 DIAGNOSIS — Z713 Dietary counseling and surveillance: Secondary | ICD-10-CM | POA: Diagnosis not present

## 2015-05-20 DIAGNOSIS — E118 Type 2 diabetes mellitus with unspecified complications: Secondary | ICD-10-CM | POA: Diagnosis not present

## 2015-05-20 NOTE — Patient Instructions (Signed)
Plan:  Aim for 4 Carb Choices per meal (60 grams) +/- 1 either way  Aim for 0-2 Carbs per snack if hungry  Include protein in moderation with your meals and snacks Consider reading food labels for Total Carbohydrate of foods Continue with your activity level daily as tolerated Consider checking BG at alternate times per day as directed by MD  Continue taking medication as directed by MD

## 2015-05-21 ENCOUNTER — Encounter: Payer: Self-pay | Admitting: Cardiology

## 2015-05-27 ENCOUNTER — Encounter: Payer: Self-pay | Admitting: Cardiology

## 2015-05-27 NOTE — Progress Notes (Signed)
Diabetes Self-Management Education  Visit Type: First/Initial  Appt. Start Time: 1515 Appt. End Time: [id number]  05/27/2015  [name], identified by name and date of birth, is a 70 y.o. male with a diagnosis of Diabetes: Type 2.   ASSESSMENT  Height 5\' 10"  (1.778 m), weight 213 lb 9.6 oz (96.888 kg). Body mass index is 30.65 kg/(m^2).      Diabetes Self-Management Education - 05/20/15 1508    Visit Information   Visit Type First/Initial   Initial Visit   Diabetes Type Type 2   Are you currently following a meal plan? No   Are you taking your medications as prescribed? Yes   Health Coping   How would you rate your overall health? Good   Psychosocial Assessment   Patient Belief/Attitude about Diabetes Motivated to manage diabetes   Self-care barriers None   Self-management support Doctor's office   Other persons present Patient   Patient Concerns Glycemic Control   Special Needs None   Preferred Learning Style Visual;Hands on   Learning Readiness Ready   How often do you need to have someone help you when you read instructions, pamphlets, or other written materials from your doctor or pharmacy? 1 - Never   What is the last grade level you completed in school? graduate school   Complications   Last HgB A1C per patient/outside source 10 %   How often do you check your blood sugar? --  2-4 times a week   Fasting Blood glucose range (mg/dL) >200   Postprandial Blood glucose range (mg/dL) >200   Have you had a dilated eye exam in the past 12 months? Yes   Have you had a dental exam in the past 12 months? Yes   Are you checking your feet? Yes   How many days per week are you checking your feet? 5   Dietary Intake   Breakfast flavored or regualar oatmeal OR pancakes and sausage OR raisin bran crunch cereal OR    Snack (morning) fresh fruit occasionally   Lunch Progresso soup with crackers or bread   Snack (afternoon) pretzels   Dinner meat, starch, vegetables OR home made  pizza with left overs OR mixed roasted vegetables with lean meat   Snack (evening) popcorn OR ice cream OR sherbert OR    Beverage(s) OJ, coffee with Splenda and half and half, diet soda, flavored water, iced water   Exercise   Exercise Type Light (walking / raking leaves)  walks his dog every day, rides his bike, hiking and cross coutnty skiing   How many days per week to you exercise? 7   How many minutes per day do you exercise? 60   Total minutes per week of exercise 420   Patient Education   Previous Diabetes Education No   Disease state  Definition of diabetes, type 1 and 2, and the diagnosis of diabetes;Factors that contribute to the development of diabetes   Nutrition management  Role of diet in the treatment of diabetes and the relationship between the three main macronutrients and blood glucose level;Food label reading, portion sizes and measuring food.;Carbohydrate counting   Physical activity and exercise  Role of exercise on diabetes management, blood pressure control and cardiac health.   Medications Reviewed patients medication for diabetes, action, purpose, timing of dose and side effects.   Monitoring Purpose and frequency of SMBG.;Identified appropriate SMBG and/or A1C goals.   Chronic complications Relationship between chronic complications and blood glucose control   Psychosocial  adjustment Role of stress on diabetes   Individualized Goals (developed by patient)   Nutrition Follow meal plan discussed   Physical Activity Exercise 3-5 times per week   Medications take my medication as prescribed   Monitoring  test my blood glucose as discussed   Outcomes   Expected Outcomes Demonstrated interest in learning. Expect positive outcomes   Future DMSE 4-6 wks   Program Status Not Completed      Individualized Plan for Diabetes Self-Management Training:   Learning Objective:  Patient will have a greater understanding of diabetes self-management. Patient education plan is  to attend individual and/or group sessions per assessed needs and concerns.   Plan:   Patient Instructions  Plan:  Aim for 4 Carb Choices per meal (60 grams) +/- 1 either way  Aim for 0-2 Carbs per snack if hungry  Include protein in moderation with your meals and snacks Consider reading food labels for Total Carbohydrate of foods Continue with your activity level daily as tolerated Consider checking BG at alternate times per day as directed by MD  Continue taking medication as directed by MD      Expected Outcomes:  Demonstrated interest in learning. Expect positive outcomes  Education material provided: Living Well with Diabetes, A1C conversion sheet, Meal plan card and Carbohydrate counting sheet  If problems or questions, patient to contact team via:  Phone and Email  Future DSME appointment: 4-6 wks

## 2015-06-08 ENCOUNTER — Encounter (HOSPITAL_COMMUNITY)
Admission: RE | Admit: 2015-06-08 | Discharge: 2015-06-08 | Disposition: A | Discharge status: Home / Self Care | Payer: Medicare Other | Source: Ambulatory Visit | Attending: Surgery | Admitting: Surgery

## 2015-06-08 ENCOUNTER — Encounter (HOSPITAL_COMMUNITY): Payer: Self-pay

## 2015-06-08 DIAGNOSIS — G4733 Obstructive sleep apnea (adult) (pediatric): Secondary | ICD-10-CM | POA: Insufficient documentation

## 2015-06-08 DIAGNOSIS — I2510 Atherosclerotic heart disease of native coronary artery without angina pectoris: Secondary | ICD-10-CM | POA: Insufficient documentation

## 2015-06-08 DIAGNOSIS — Z7984 Long term (current) use of oral hypoglycemic drugs: Secondary | ICD-10-CM | POA: Insufficient documentation

## 2015-06-08 DIAGNOSIS — Z01812 Encounter for preprocedural laboratory examination: Secondary | ICD-10-CM | POA: Diagnosis not present

## 2015-06-08 DIAGNOSIS — E119 Type 2 diabetes mellitus without complications: Secondary | ICD-10-CM | POA: Diagnosis not present

## 2015-06-08 DIAGNOSIS — H3530 Unspecified macular degeneration: Secondary | ICD-10-CM | POA: Diagnosis not present

## 2015-06-08 DIAGNOSIS — Z7982 Long term (current) use of aspirin: Secondary | ICD-10-CM | POA: Diagnosis not present

## 2015-06-08 DIAGNOSIS — Z79899 Other long term (current) drug therapy: Secondary | ICD-10-CM | POA: Diagnosis not present

## 2015-06-08 DIAGNOSIS — I10 Essential (primary) hypertension: Secondary | ICD-10-CM | POA: Insufficient documentation

## 2015-06-08 DIAGNOSIS — K429 Umbilical hernia without obstruction or gangrene: Secondary | ICD-10-CM | POA: Insufficient documentation

## 2015-06-08 LAB — CBC
HEMATOCRIT: 41.3 % (ref 39.0–52.0)
Hemoglobin: 13.9 g/dL (ref 13.0–17.0)
MCH: 31.7 pg (ref 26.0–34.0)
MCHC: 33.7 g/dL (ref 30.0–36.0)
MCV: 94.3 fL (ref 78.0–100.0)
Platelets: 218 10*3/uL (ref 150–400)
RBC: 4.38 MIL/uL (ref 4.22–5.81)
RDW: 12.4 % (ref 11.5–15.5)
WBC: 8.1 10*3/uL (ref 4.0–10.5)

## 2015-06-08 LAB — BASIC METABOLIC PANEL
Anion gap: 9 (ref 5–15)
BUN: 23 mg/dL — ABNORMAL HIGH (ref 6–20)
CALCIUM: 9 mg/dL (ref 8.9–10.3)
CO2: 25 mmol/L (ref 22–32)
CREATININE: 1.02 mg/dL (ref 0.61–1.24)
Chloride: 101 mmol/L (ref 101–111)
GFR calc non Af Amer: >60 mL/min (ref >60)
GLUCOSE: 313 mg/dL — AB (ref 65–99)
Potassium: 4.4 mmol/L (ref 3.5–5.1)
Sodium: 135 mmol/L (ref 135–145)

## 2015-06-08 NOTE — Pre-Procedure Instructions (Addendum)
EKG 10'16 -[facility] with chart. Dr. Darene Lamer. Turner,cardiology-LOV notes Epic 05-18-15. 06-09-15 0800 AM Hgb A1C level faxed to to Dr. Pollie Friar in basket.

## 2015-06-08 NOTE — Patient Instructions (Addendum)
Carl Knapp  06/08/2015   Your procedure is scheduled on: 06-10-15 Thursday  Report to [facility]  Entrance take [facility]  elevators to 3rd floor to  [place] at   0900 AM.  Call this number if you have problems the morning of surgery [phone]   Remember: ONLY 1 PERSON MAY GO WITH YOU TO SHORT STAY TO GET  READY MORNING OF [place].  Do not eat food or drink liquids :After Midnight.     Take these medicines the morning of surgery with A SIP OF WATER: Amlodipine. Simvastatin. (Usual bedtime dose -Metoprolol)-night before. DO NOT TAKE ANY DIABETIC MEDICATIONS DAY OF YOUR SURGERY                               You may not have any metal on your body including hair pins and              piercings  Do not wear jewelry, make-up, lotions, powders or perfumes, deodorant             Do not wear nail polish.  Do not shave  48 hours prior to surgery.              Men may shave face and neck.   Do not bring valuables to the hospital. [place].  Contacts, dentures or bridgework may not be worn into surgery.  Leave suitcase in the car. After surgery it may be brought to your room.     Patients discharged the day of surgery will not be allowed to drive home.  Name and phone number of your driver: Amondre Lasek , daughter [phone]   Special Instructions: N/A              Please read over the following fact sheets you were given: _____________________________________________________________________             [facility] - Preparing for Surgery Before surgery, you can play an important role.  Because skin is not sterile, your skin needs to be as free of germs as possible.  You can reduce the number of germs on your skin by washing with CHG (chlorahexidine gluconate) soap before surgery.  CHG is an antiseptic cleaner which kills germs and bonds with the skin to continue killing germs even after  washing. Please DO NOT use if you have an allergy to CHG or antibacterial soaps.  If your skin becomes reddened/irritated stop using the CHG and inform your nurse when you arrive at Short Stay. Do not shave (including legs and underarms) for at least 48 hours prior to the first CHG shower.  You may shave your face/neck. Please follow these instructions carefully:  1.  Shower with CHG Soap the night before surgery and the  morning of Surgery.  2.  If you choose to wash your hair, wash your hair first as usual with your  normal  shampoo.  3.  After you shampoo, rinse your hair and body thoroughly to remove the  shampoo.                           4.  Use CHG as you would any other  liquid soap.  You can apply chg directly  to the skin and wash                       Gently with a scrungie or clean washcloth.  5.  Apply the CHG Soap to your body ONLY FROM THE NECK DOWN.   Do not use on face/ open                           Wound or open sores. Avoid contact with eyes, ears mouth and genitals (private parts).                       Wash face,  Genitals (private parts) with your normal soap.             6.  Wash thoroughly, paying special attention to the area where your surgery  will be performed.  7.  Thoroughly rinse your body with warm water from the neck down.  8.  DO NOT shower/wash with your normal soap after using and rinsing off  the CHG Soap.                9.  Pat yourself dry with a clean towel.            10.  Wear clean pajamas.            11.  Place clean sheets on your bed the night of your first shower and do not  sleep with pets. Day of Surgery : Do not apply any lotions/deodorants the morning of surgery.  Please wear clean clothes to the hospital/surgery center.  FAILURE TO FOLLOW THESE INSTRUCTIONS MAY RESULT IN THE CANCELLATION OF YOUR SURGERY PATIENT SIGNATURE_________________________________  NURSE  SIGNATURE__________________________________  ________________________________________________________________________

## 2015-06-09 LAB — HEMOGLOBIN A1C
Hgb A1c MFr Bld: 11.8 % — ABNORMAL HIGH (ref 4.8–5.6)
Mean Plasma Glucose: 292 mg/dL

## 2015-06-09 NOTE — H&P (Signed)
Carl Knapp  Location: [facility] Surgery Patient #: [id number] DOB: 30-Mar-1946 Single / Language: Carl Knapp / Race: White Male   History of Present Illness   Patient words: hernia.   The patient is a 70 year old male who presents with an umbilical hernia.   His PCP is dr. Lenard Simmer.  He comes by himself.   He has noticed an umbilical hernia for years, but in the last several months it has caused some pain. He has been working on Ecologist and getting in certain positions causes discomfort. He has had bilateral inguinal hernias done in the early 1980s with no residual problems. He has no history of peptic ulcer disease liver disease or colon disease. He had a prior colonoscopy by [facility] about 4 years ago.  I discussed the indications and complications of hernia surgery with the patient. I discussed both the laparoscopic and open approach to hernia repair. The potential risks of hernia surgery include, but are not limited to, bleeding, infection, open surgery, nerve injury, and recurrence of the hernia. I provided the patient literature about hernia surgery.  Past Medical History: 1. DM - 3 to 4 years - pills only 2. Macular degeneration 3. Obstructive sleep apnea, on CPAP 4. HTN 5. Minimal CAD - followed by Dr. Ashok Norris Had cath about one year ago - which was negative 6 Was put on K+ for pain on exercion - this has worked well  Social History: I took care of his wife, Carl Knapp. 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 His daughter works for [employer] in [place], [place]. She is familiar with the hernia products.   Other Problems (Ammie Eversole, LPN; 579FGE QA348G AM) Diabetes Mellitus Gastroesophageal Reflux Disease Hemorrhoids High blood pressure Hypercholesterolemia Inguinal Hernia Sleep Apnea Umbilical Hernia Repair  Past Surgical History (Ammie Eversole, LPN; 579FGE QA348G  AM) Appendectomy Open Inguinal Hernia Surgery Bilateral. multiple Oral Surgery Tonsillectomy Vasectomy  Diagnostic Studies History (Ammie Eversole, LPN; 579FGE QA348G AM) Colonoscopy 1-5 years ago  Allergies (Ammie Eversole, LPN; 579FGE 624THL AM) No Known Drug Allergies01/08/2015  Medication History (Ammie Eversole, LPN; 579FGE 075-GRM AM) AmLODIPine Besylate (10MG  Tablet, Oral) Active. Klor-Con M20 [employer] Tablet ER, Oral) Active. Lisinopril-Hydrochlorothiazide (20-12.5MG  Tablet, Oral) Active. MetFORMIN HCl (1000MG  Tablet, Oral) Active. Metoprolol Succinate ER (25MG  Tablet ER 24HR, Oral) Active. Simvastatin (20MG  Tablet, Oral) Active. Aspirin (81MG  Tablet Chewable, Oral) Active. Naproxen Sodium (550MG  Tablet, Oral) Active. Nitrostat (0.4MG  Tab Sublingual, Sublingual) Active. Medications Reconciled  Social History (Ammie Eversole, LPN; 579FGE QA348G AM) Alcohol use Occasional alcohol use. Caffeine use Carbonated beverages, Coffee. No drug use Tobacco use Former smoker.  Family History Aleatha Borer, LPN; 579FGE QA348G AM) Arthritis Mother, Sister. Cancer Father. Colon Cancer Mother. Heart Disease Father. Hypertension Father. Respiratory Condition Father.    Review of Systems (Ammie Eversole LPN; 579FGE QA348G AM) General Not Present- Appetite Loss, Chills, Fatigue, Fever, Night Sweats, Weight Gain and Weight Loss. Skin Not Present- Change in Wart/Mole, Dryness, Hives, Jaundice, New Lesions, Non-Healing Wounds, Rash and Ulcer. HEENT Present- Hearing Loss, Ringing in the Ears, Seasonal Allergies and Wears glasses/contact lenses. Not Present- Earache, Hoarseness, Nose Bleed, Oral Ulcers, Sinus Pain, Sore Throat, Visual Disturbances and Yellow Eyes. Respiratory Not Present- Bloody sputum, Chronic Cough, Difficulty Breathing, Snoring and Wheezing. Breast Not Present- Breast Mass, Breast Pain, Nipple Discharge and Skin Changes. Cardiovascular  Not Present- Chest Pain, Difficulty Breathing Lying Down, Leg Cramps, Palpitations, Rapid Heart Rate, Shortness of Breath and Swelling  of Extremities. Gastrointestinal Present- Abdominal Pain and Nausea. Not Present- Bloating, Bloody Stool, Change in Bowel Habits, Chronic diarrhea, Constipation, Difficulty Swallowing, Excessive gas, Gets full quickly at meals, Hemorrhoids, Indigestion, Rectal Pain and Vomiting. Male Genitourinary Present- Frequency, Nocturia, Urgency and Urine Leakage. Not Present- Blood in Urine, Change in Urinary Stream, Impotence and Painful Urination. Musculoskeletal Not Present- Back Pain, Joint Pain, Joint Stiffness, Muscle Pain, Muscle Weakness and Swelling of Extremities. Neurological Not Present- Decreased Memory, Fainting, Headaches, Numbness, Seizures, Tingling, Tremor, Trouble walking and Weakness. Psychiatric Not Present- Anxiety, Bipolar, Change in Sleep Pattern, Depression, Fearful and Frequent crying. Endocrine Present- New Diabetes. Not Present- Cold Intolerance, Excessive Hunger, Hair Changes, Heat Intolerance and Hot flashes.  Vitals (Ammie Eversole LPN; 579FGE 624THL AM) 05/12/2015 10:29 AM Weight: 212.2 lb Height: 70in Body Surface Area: 2.14 m Body Mass Index: 30.45 kg/m  Temp.: 98.66F(Oral)  Pulse: 74 (Regular)  BP: 122/68 (Sitting, Left Arm, Standard)    Physical Exam  General: WN older WM alert and generally healthy appearing. HEENT: Normal. Pupils equal.  Neck: Supple. No mass. No thyroid mass. Lymph Nodes: No supraclavicular or cervical nodes.  Lungs: Clear to auscultation and symmetric breath sounds. Heart: RRR. No murmur or rub.  Abdomen: Soft. No tenderness. Normal bowel sounds. No abdominal scars.  Has 5 cm bulge at umbilicus with about 3 cm fascial defect. Has diastasis recti.  Extremities: Good strength and ROM in upper and lower extremities.  Neurologic: Grossly intact to motor and sensory function. Psychiatric: Has  normal mood and affect. Behavior is normal.  Assessment & Plan  1.  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  OBSTRUCTIVE SLEEP APNEA, ADULT (G47.33)  3. DM - 3 to 4 years - pills only  Glucose - 313 - 06/08/2015  HgbA1c - 11.8 4. Macular degeneration 5. Obstructive sleep apnea, on CPAP 6. HTN 7. Minimal CAD - followed by Dr. Ashok Norris  Had cath about one year ago - which was negative   Alphonsa Overall, MD, [facility] Surgery Pager: [phone] Office phone:  [phone]

## 2015-06-09 NOTE — Progress Notes (Signed)
06-09-15 0800 Hgb A1C = 11.8 pt is known Diabetic.

## 2015-06-10 ENCOUNTER — Encounter (HOSPITAL_COMMUNITY): Admission: RE | Payer: Self-pay | Source: Ambulatory Visit

## 2015-06-10 ENCOUNTER — Ambulatory Visit (HOSPITAL_COMMUNITY): Admission: RE | Admit: 2015-06-10 | Payer: Medicare Other | Source: Ambulatory Visit | Admitting: Surgery

## 2015-06-10 SURGERY — REPAIR, HERNIA, UMBILICAL, LAPAROSCOPIC
Anesthesia: General

## 2015-06-15 DIAGNOSIS — E1165 Type 2 diabetes mellitus with hyperglycemia: Secondary | ICD-10-CM | POA: Diagnosis not present

## 2015-06-15 DIAGNOSIS — Z7984 Long term (current) use of oral hypoglycemic drugs: Secondary | ICD-10-CM | POA: Diagnosis not present

## 2015-06-21 ENCOUNTER — Other Ambulatory Visit: Payer: Self-pay | Admitting: Cardiology

## 2015-06-23 ENCOUNTER — Encounter: Payer: Self-pay | Admitting: *Deleted

## 2015-06-23 ENCOUNTER — Encounter: Payer: Medicare Other | Attending: Family Medicine | Admitting: *Deleted

## 2015-06-23 VITALS — Ht 70.0 in | Wt 215.0 lb

## 2015-06-23 DIAGNOSIS — E118 Type 2 diabetes mellitus with unspecified complications: Secondary | ICD-10-CM | POA: Insufficient documentation

## 2015-06-23 DIAGNOSIS — Z713 Dietary counseling and surveillance: Secondary | ICD-10-CM | POA: Diagnosis not present

## 2015-06-23 NOTE — Progress Notes (Signed)
Diabetes Self-Management Education  Visit Type:  Follow-up  Appt. Start Time: 0830 Appt. End Time: 0900  06/23/2015  [name], identified by name and date of birth, is a 70 y.o. male with a diagnosis of Diabetes: Type 2.   ASSESSMENT  Height 5\' 10"  (1.778 m), weight 215 lb (97.523 kg). Body mass index is 30.85 kg/(m^2).       Diabetes Self-Management Education - 06/23/15 0859    Health Coping   How would you rate your overall health? Good   Psychosocial Assessment   Patient Belief/Attitude about Diabetes Motivated to manage diabetes   Self-care barriers None   Self-management support Doctor's office;Family   Patient Concerns Glycemic Control;Nutrition/Meal planning   Special Needs None   Learning Readiness Change in progress   Complications   How often do you check your blood sugar? 1-2 times/day   Fasting Blood glucose range (mg/dL) 180-200   Postprandial Blood glucose range (mg/dL) 130-179   Number of hypoglycemic episodes per month 0   Dietary Intake   Breakfast english muffin with sausage and cheese, fresh fruit   Lunch protein like cottage chees with salad or vegetables   Dinner lean meat with vegetables and maybe one serving of starch   Exercise   Exercise Type Light (walking / raking leaves)   How many days per week to you exercise? 7   How many minutes per day do you exercise? 60   Total minutes per week of exercise 420   Patient Education   Previous Diabetes Education Yes (please comment)  initial  visit with me 1 month ago   Individualized Goals (developed by patient)   Nutrition Follow meal plan discussed   Physical Activity Exercise 3-5 times per week   Medications take my medication as prescribed   Monitoring  send in my blood glucose log as discussed   Reducing Risk examine blood glucose patterns   Patient Self-Evaluation of Goals - Patient rates self as meeting previously set goals (% of time)   Nutrition >75%   Physical Activity >75%   Medications >75%   Monitoring >75%   Problem Solving >75%   Reducing Risk >75%   Health Coping >75%   Outcomes   Program Status Completed   Subsequent Visit   Since your last visit have you continued or begun to take your medications as prescribed? Yes   Since your last visit have you experienced any weight changes? No change   Since your last visit, are you checking your blood glucose at least once a day? Yes      Learning Objective:  Patient will have a greater understanding of diabetes self-management. Patient education plan is to attend individual and/or group sessions per assessed needs and concerns.   Plan:   Patient Instructions  Plan:  Aim for 4 Carb Choices per meal (60 grams) +/- 1 either way  Aim for 0-2 Carbs per snack if hungry  Consider having an evening snack of 1-2 carb choices and some protein to help lower your AM BG numbers Include protein in moderation with your meals and snacks Consider reading food labels for Total Carbohydrate of foods Continue with your activity level daily as tolerated Continue checking BG at alternate times per day as directed by MD  Continue taking medication as directed by MD    Expected Outcomes:  Demonstrated interest in learning. Expect positive outcomes  Education material provided: A1C conversion sheet with target BG ranges listed for pre and post mealtimes  If problems or questions, patient to contact team via:  Phone and Email  Future DSME appointment: - PRN

## 2015-06-23 NOTE — Patient Instructions (Signed)
Plan:  Aim for 4 Carb Choices per meal (60 grams) +/- 1 either way  Aim for 0-2 Carbs per snack if hungry  Consider having an evening snack of 1-2 carb choices and some protein to help lower your AM BG numbers Include protein in moderation with your meals and snacks Consider reading food labels for Total Carbohydrate of foods Continue with your activity level daily as tolerated Continue checking BG at alternate times per day as directed by MD  Continue taking medication as directed by MD

## 2015-09-14 DIAGNOSIS — E1165 Type 2 diabetes mellitus with hyperglycemia: Secondary | ICD-10-CM | POA: Diagnosis not present

## 2015-09-14 DIAGNOSIS — Z7984 Long term (current) use of oral hypoglycemic drugs: Secondary | ICD-10-CM | POA: Diagnosis not present

## 2015-10-15 DIAGNOSIS — J069 Acute upper respiratory infection, unspecified: Secondary | ICD-10-CM | POA: Diagnosis not present

## 2015-11-22 ENCOUNTER — Telehealth: Payer: Self-pay | Admitting: Cardiology

## 2015-11-22 NOTE — Telephone Encounter (Signed)
Patient aware that he is down for follow-up at the beginning of 2018

## 2015-11-22 NOTE — Telephone Encounter (Signed)
New Message:    Pt called about an appointment,he was not due until 2018. He said he thought it was required to be seen 2 times a year for his sleep problem.

## 2015-12-13 DIAGNOSIS — Z1389 Encounter for screening for other disorder: Secondary | ICD-10-CM | POA: Diagnosis not present

## 2015-12-13 DIAGNOSIS — E1165 Type 2 diabetes mellitus with hyperglycemia: Secondary | ICD-10-CM | POA: Diagnosis not present

## 2015-12-13 DIAGNOSIS — I25119 Atherosclerotic heart disease of native coronary artery with unspecified angina pectoris: Secondary | ICD-10-CM | POA: Diagnosis not present

## 2015-12-13 DIAGNOSIS — R55 Syncope and collapse: Secondary | ICD-10-CM | POA: Diagnosis not present

## 2015-12-13 DIAGNOSIS — R002 Palpitations: Secondary | ICD-10-CM | POA: Diagnosis not present

## 2015-12-13 DIAGNOSIS — E7800 Pure hypercholesterolemia, unspecified: Secondary | ICD-10-CM | POA: Diagnosis not present

## 2015-12-13 DIAGNOSIS — Z125 Encounter for screening for malignant neoplasm of prostate: Secondary | ICD-10-CM | POA: Diagnosis not present

## 2015-12-13 DIAGNOSIS — I10 Essential (primary) hypertension: Secondary | ICD-10-CM | POA: Diagnosis not present

## 2015-12-13 DIAGNOSIS — Z0000 Encounter for general adult medical examination without abnormal findings: Secondary | ICD-10-CM | POA: Diagnosis not present

## 2015-12-13 DIAGNOSIS — Z1211 Encounter for screening for malignant neoplasm of colon: Secondary | ICD-10-CM | POA: Diagnosis not present

## 2015-12-14 ENCOUNTER — Ambulatory Visit (INDEPENDENT_AMBULATORY_CARE_PROVIDER_SITE_OTHER): Payer: Medicare Other | Admitting: Nurse Practitioner

## 2015-12-14 ENCOUNTER — Encounter: Payer: Self-pay | Admitting: Cardiology

## 2015-12-14 VITALS — BP 116/62 | HR 84 | Ht 70.0 in | Wt 206.8 lb

## 2015-12-14 DIAGNOSIS — E785 Hyperlipidemia, unspecified: Secondary | ICD-10-CM | POA: Diagnosis not present

## 2015-12-14 DIAGNOSIS — I2583 Coronary atherosclerosis due to lipid rich plaque: Secondary | ICD-10-CM

## 2015-12-14 DIAGNOSIS — R002 Palpitations: Secondary | ICD-10-CM

## 2015-12-14 DIAGNOSIS — I2510 Atherosclerotic heart disease of native coronary artery without angina pectoris: Secondary | ICD-10-CM | POA: Diagnosis not present

## 2015-12-14 DIAGNOSIS — I10 Essential (primary) hypertension: Secondary | ICD-10-CM

## 2015-12-14 DIAGNOSIS — R079 Chest pain, unspecified: Secondary | ICD-10-CM

## 2015-12-14 NOTE — Progress Notes (Signed)
CARDIOLOGY OFFICE NOTE  Date:  12/14/2015    Carl Knapp Date of Birth: 11/05/1945 Medical Record [id number]  PCP:  Carl Austria, MD  Cardiologist:  Carl Knapp (sleep) Carl Knapp [facility] Cardiology)  Chief Complaint  Patient presents with  . Chest Pain  . Coronary Artery Disease    Work in visit - seen for Dr. Radford Knapp    History of Present Illness: Carl Knapp is a 70 y.o. male who presents today for a work in visit. Seen for Dr. Radford Knapp.   He has a history of sleep apnea, diabetes, HTN, HL, nonobstructive CAD and PVC's. Last seen back in January - seemed to be doing ok from our standpoint.   Comes in today. Here alone. His wife - Carl Knapp was a Marine scientist at [employer] that I worked with - she died from breast cancer in the last year. He has not formally seen Dr. Tamala Knapp in over 2 years and has been seeing Dr. Radford Knapp. He notes that the past year has been pretty stressful. His A1C got way out of control - he attributed this to Carl Knapp's illness. He entered a diabetic study back in April - saw a nutritionist - lost weight and now has an A1C of 5. For the past 3 months of so, he notes that he is having PVC's or "skipped beats" that if they persist over a minute or so, then he gets confused and "not able to think properly". He is having several spells each week - does not happen every day. He will feel a little breathless and have some chest discomfort that is associated with this - otherwise he has no chest pain. He walks his dog 3 times a day for at least a mile or two daily without any issue. He thinks he should come out of the diabetic study. He notes that what he is feeling now is quite different from he has had in the past. He had numerous spells last week but this was while planning a memorial service and spreading his wife's ashes - he does not that emotional stress is a trigger for him. He has stopped all caffeine - no change in the symptoms. No real alcohol use.   Past Medical History:    Diagnosis Date  . Coronary artery disease 12/2013   20-30% RCA by cath- states was good.  . Diabetes mellitus without complication ([place])    type II  . DVT (deep venous thrombosis) ([place])    bilateral legs-greater on left. -3-5 years ago-tx. coumadin x 1 year.  Marland Kitchen Hx of cardiovascular stress test    ETT-Myoview (7/15):  ECG with ST depression; freq PVCs, + chest pain; normal perfusion  . Hyperlipidemia    LDL goal<100  . Hypertension   . Macular degeneration    mild  . OSA (obstructive sleep apnea)    Severe w AHI 37/hr now on CPAP at 10cm H2O    Past Surgical History:  Procedure Laterality Date  . APPENDECTOMY     open '68  . HERNIA REPAIR Bilateral    inguinal hernia repair '80  . LEFT HEART CATHETERIZATION WITH CORONARY ANGIOGRAM N/A 12/09/2013   Procedure: LEFT HEART CATHETERIZATION WITH CORONARY ANGIOGRAM;  Surgeon: Jettie Booze, MD;  Location: [facility];  Service: Cardiovascular;  Laterality: N/A;  . VASECTOMY       Medications: Current Outpatient Prescriptions  Medication Sig Dispense Refill  . amLODipine (NORVASC) 10 MG tablet Take 10 mg by mouth daily.    Marland Kitchen  aspirin EC 81 MG tablet Take 81 mg by mouth daily.    Marland Kitchen KLOR-CON M20 20 MEQ tablet Take 40 mEq by mouth daily.     Marland Kitchen lisinopril-hydrochlorothiazide (PRINZIDE,ZESTORETIC) 20-12.5 MG per tablet Take 2 tablets by mouth daily.    . metFORMIN (GLUCOPHAGE) 1000 MG tablet Take 1,000 mg by mouth 2 (two) times daily with a meal.     . metoprolol succinate (TOPROL-XL) 25 MG 24 hr tablet TAKE ONE TABLET BY MOUTH ONCE DAILY 90 tablet 3  . Multiple Vitamins-Minerals (PRESERVISION AREDS 2 PO) Take 1 capsule by mouth 2 (two) times daily.    . nitroGLYCERIN (NITROSTAT) 0.4 MG SL tablet Place 1 tablet (0.4 mg total) under the tongue every 5 (five) minutes as needed for chest pain. 25 tablet 5  . NON FORMULARY Take 300 tablets by mouth every morning. pharmquest study for diabetes    . NON FORMULARY Inject 1 mL into the skin  once a week. pharmquest for diabetes    . simvastatin (ZOCOR) 20 MG tablet Take 20 mg by mouth every morning.      No current facility-administered medications for this visit.     Allergies: No Known Allergies  Social History: The patient  reports that he quit smoking about 5 years ago. His smoking use included Cigarettes. He has never used smokeless tobacco. He reports that he drinks alcohol. He reports that he does not use drugs.   Family History: The patient's family history includes AAA (abdominal aortic aneurysm) in his father; CAD in his father; Heart attack in his father.   Review of Systems: Please see the history of present illness.   Otherwise, the review of systems is positive for none.   All other systems are reviewed and negative.   Physical Exam: VS:  BP 116/62   Pulse 84   Ht 5\' 10"  (1.778 m)   Wt 206 lb 12.8 oz (93.8 kg)   BMI 29.67 kg/m  .  BMI Body mass index is 29.67 kg/m.  Wt Readings from Last 3 Encounters:  12/14/15 206 lb 12.8 oz (93.8 kg)  06/23/15 215 lb (97.5 kg)  06/08/15 214 lb 6 oz (97.2 kg)    General: Pleasant. Well developed, well nourished and in no acute distress.   HEENT: Normal.  Neck: Supple, no JVD, carotid bruits, or masses noted.  Cardiac: Regular rate and rhythm. No murmurs, rubs, or gallops. No edema.  Respiratory:  Lungs are clear to auscultation bilaterally with normal work of breathing.  GI: Soft and nontender.  MS: No deformity or atrophy. Gait and ROM intact.  Skin: Warm and dry. Color is normal.  Neuro:  Strength and sensation are intact and no gross focal deficits noted.  Psych: Alert, appropriate and with normal affect.   LABORATORY DATA:  EKG:  EKG is ordered today. This demonstrates NSR.  Lab Results  Component Value Date   WBC 8.1 06/08/2015   HGB 13.9 06/08/2015   HCT 41.3 06/08/2015   PLT 218 06/08/2015   GLUCOSE 313 (H) 06/08/2015   ALT 22 11/13/2012   AST 26 11/13/2012   NA 135 06/08/2015   K 4.4  06/08/2015   CL 101 06/08/2015   CREATININE 1.02 06/08/2015   BUN 23 (H) 06/08/2015   CO2 25 06/08/2015   TSH 0.93 01/02/2014   INR 1.1 (H) 12/05/2013   HGBA1C 11.8 (H) 06/08/2015    BNP (last 3 results) No results for input(s): BNP in the last 8760 hours.  ProBNP (last  3 results) No results for input(s): PROBNP in the last 8760 hours.   Other Studies Reviewed Today:  Coronary angiography 12/2013: Coronary dominance: right  Left mainstem: Normal.  Left anterior descending (LAD): Normal  Left circumflex (LCx): Normal  Right coronary artery (RCA): 20-30% disease in the mid vessel.  Left ventriculography: Left ventricular systolic function is normal, LVEF is estimated at 55-65%, there is no significant mitral regurgitation   Final Conclusions:   1. Mild nonobstructive CAD 2. Normal LV function.  Recommendations: risk factor modification.  Peter Martinique, MDFACC  Assessment/Plan: 1. Symptomatic palpitations - will arrange for event monitor and echocardiogram. Worry about possible AF in the setting of getting confused.  Will get his labs from [place]. Check TSH and MG level here today. Hold on changing medicines for now. Ok to take himself out of the study that he is participating in. Further disposition to follow.   2. CAD - non obstructive by cath from 2015. No exertional symptoms.   3. HTN - BP looks good.   4. HLD - labs checked by PCP  5. DM - much better control with diet changes and weight loss.   Current medicines are reviewed with the patient today.  The patient does not have concerns regarding medicines other than what has been noted above.  The following changes have been made:  See above.  Labs/ tests ordered today include:    Orders Placed This Encounter  Procedures  . TSH  . Magnesium  . Cardiac event monitor  . EKG 12-Lead  . ECHOCARDIOGRAM COMPLETE     Disposition:   Further disposition to follow.   Patient is agreeable to this plan  and will call if any problems develop in the interim.   Signed: Burtis Junes, RN, ANP-C 12/14/2015 2:42 PM  [facility] [address] [place], [place]  [address] Phone: [phone] Fax: [phone]

## 2015-12-14 NOTE — Patient Instructions (Addendum)
We will be checking the following labs today - TSH and MG level  We will call to get your labs from [place]   Medication Instructions:    Continue with your current medicines for now.     Testing/Procedures To Be Arranged:  Echocardiogram  Event monitor  Follow-Up:   See Dr. Tamala Julian as planned    Other Special Instructions:   Would take yourself out of the diabetic study you are in - continue to monitor your blood sugars and let Dr. Alyson Ingles know how they are running.     If you need a refill on your cardiac medications before your next appointment, please call your pharmacy.   Call the [place] office at [phone] if you have any questions, problems or concerns.

## 2015-12-15 ENCOUNTER — Ambulatory Visit (INDEPENDENT_AMBULATORY_CARE_PROVIDER_SITE_OTHER): Payer: Medicare Other

## 2015-12-15 ENCOUNTER — Ambulatory Visit (HOSPITAL_COMMUNITY): Payer: Medicare Other | Attending: Cardiology

## 2015-12-15 ENCOUNTER — Other Ambulatory Visit: Payer: Self-pay

## 2015-12-15 DIAGNOSIS — I358 Other nonrheumatic aortic valve disorders: Secondary | ICD-10-CM | POA: Diagnosis not present

## 2015-12-15 DIAGNOSIS — I2510 Atherosclerotic heart disease of native coronary artery without angina pectoris: Secondary | ICD-10-CM

## 2015-12-15 DIAGNOSIS — E119 Type 2 diabetes mellitus without complications: Secondary | ICD-10-CM | POA: Insufficient documentation

## 2015-12-15 DIAGNOSIS — I119 Hypertensive heart disease without heart failure: Secondary | ICD-10-CM | POA: Insufficient documentation

## 2015-12-15 DIAGNOSIS — E785 Hyperlipidemia, unspecified: Secondary | ICD-10-CM | POA: Diagnosis not present

## 2015-12-15 DIAGNOSIS — R002 Palpitations: Secondary | ICD-10-CM | POA: Diagnosis not present

## 2015-12-15 DIAGNOSIS — I2583 Coronary atherosclerosis due to lipid rich plaque: Secondary | ICD-10-CM

## 2015-12-15 LAB — MAGNESIUM: Magnesium: 1.6 mg/dL (ref 1.5–2.5)

## 2015-12-15 LAB — TSH: TSH: 0.88 mIU/L (ref 0.40–4.50)

## 2015-12-16 ENCOUNTER — Encounter: Payer: Self-pay | Admitting: Nurse Practitioner

## 2015-12-17 ENCOUNTER — Telehealth: Payer: Self-pay | Admitting: Nurse Practitioner

## 2015-12-17 NOTE — Telephone Encounter (Signed)
New Message  Pt verbalized he returning Juanda Crumble call.

## 2016-02-04 ENCOUNTER — Encounter: Payer: Self-pay | Admitting: Interventional Cardiology

## 2016-02-04 ENCOUNTER — Ambulatory Visit (INDEPENDENT_AMBULATORY_CARE_PROVIDER_SITE_OTHER): Payer: Medicare Other | Admitting: Interventional Cardiology

## 2016-02-04 VITALS — BP 116/62 | HR 73 | Ht 70.0 in | Wt 212.4 lb

## 2016-02-04 DIAGNOSIS — I499 Cardiac arrhythmia, unspecified: Secondary | ICD-10-CM

## 2016-02-04 DIAGNOSIS — G4733 Obstructive sleep apnea (adult) (pediatric): Secondary | ICD-10-CM

## 2016-02-04 DIAGNOSIS — E785 Hyperlipidemia, unspecified: Secondary | ICD-10-CM

## 2016-02-04 DIAGNOSIS — I2510 Atherosclerotic heart disease of native coronary artery without angina pectoris: Secondary | ICD-10-CM | POA: Diagnosis not present

## 2016-02-04 DIAGNOSIS — I10 Essential (primary) hypertension: Secondary | ICD-10-CM | POA: Diagnosis not present

## 2016-02-04 DIAGNOSIS — I498 Other specified cardiac arrhythmias: Secondary | ICD-10-CM

## 2016-02-04 NOTE — Progress Notes (Signed)
Cardiology Office Note    Date:  02/04/2016   ID:  Carl Knapp, Carl Knapp [birth year], MRN [id number]  PCP:  Vena Austria, MD  Cardiologist: Sinclair Grooms, MD   Chief Complaint  Patient presents with  . Irregular Heart Beat    History of Present Illness:  Carl Knapp is a 70 y.o. male with no prior cardiac history until this summer when he developed dizziness and palpitations.  In June/July 2017 he began feeling palpitations and bradycardia with foggy sensorium. He eventually complained of this and was seen and evaluated by Truitt Merle. An echocardiogram and continuous monitor were performed. The monitor did indeed demonstrate ventricular bigeminy that correlated with palpitations. Dizzy feeling did not seem to correlate with arrhythmia. The symptoms have completely resolved after he discontinued his study drug that was being used to control his blood sugars.  The medication was Semaflutide vs Canagliflozin. He has discontinued the trial. Other means of being used to control diabetes. He feels much better off this therapy now for 2 weeks. Within a day or 2 after discontinuing the study all of his symptoms resolved.  He has not had chest discomfort, dyspnea, or other cardiac complaints.  His prior history of PVCs identified on Holter monitor.2015    Past Medical History:  Diagnosis Date  . Coronary artery disease 12/2013   20-30% RCA by cath- states was good.  . Diabetes mellitus without complication ([place])    type II  . DVT (deep venous thrombosis) ([place])    bilateral legs-greater on left. -3-5 years ago-tx. coumadin x 1 year.  Marland Kitchen Hx of cardiovascular stress test    ETT-Myoview (7/15):  ECG with ST depression; freq PVCs, + chest pain; normal perfusion  . Hyperlipidemia    LDL goal<100  . Hypertension   . Macular degeneration    mild  . OSA (obstructive sleep apnea)    Severe w AHI 37/hr now on CPAP at 10cm H2O    Past Surgical History:  Procedure Laterality  Date  . APPENDECTOMY     open '68  . HERNIA REPAIR Bilateral    inguinal hernia repair '80  . LEFT HEART CATHETERIZATION WITH CORONARY ANGIOGRAM N/A 12/09/2013   Procedure: LEFT HEART CATHETERIZATION WITH CORONARY ANGIOGRAM;  Surgeon: Jettie Booze, MD;  Location: [facility] CATH LAB;  Service: Cardiovascular;  Laterality: N/A;  . VASECTOMY      Current Medications: Outpatient Medications Prior to Visit  Medication Sig Dispense Refill  . amLODipine (NORVASC) 10 MG tablet Take 10 mg by mouth daily.    Marland Kitchen aspirin EC 81 MG tablet Take 81 mg by mouth daily.    Marland Kitchen KLOR-CON M20 20 MEQ tablet Take 40 mEq by mouth daily.     Marland Kitchen lisinopril-hydrochlorothiazide (PRINZIDE,ZESTORETIC) 20-12.5 MG per tablet Take 2 tablets by mouth daily.    . metFORMIN (GLUCOPHAGE) 1000 MG tablet Take 1,000 mg by mouth 2 (two) times daily with a meal.     . metoprolol succinate (TOPROL-XL) 25 MG 24 hr tablet TAKE ONE TABLET BY MOUTH ONCE DAILY 90 tablet 3  . Multiple Vitamins-Minerals (PRESERVISION AREDS 2 PO) Take 1 capsule by mouth 2 (two) times daily.    . nitroGLYCERIN (NITROSTAT) 0.4 MG SL tablet Place 1 tablet (0.4 mg total) under the tongue every 5 (five) minutes as needed for chest pain. 25 tablet 5  . simvastatin (ZOCOR) 20 MG tablet Take 20 mg by mouth every morning.     . NON FORMULARY Take 300  tablets by mouth every morning. pharmquest study for diabetes    . NON FORMULARY Inject 1 mL into the skin once a week. pharmquest for diabetes     No facility-administered medications prior to visit.      Allergies:   Review of patient's allergies indicates no known allergies.   Social History   Social History  . Marital status: Married    Spouse name: N/A  . Number of children: N/A  . Years of education: N/A   Social History Main Topics  . Smoking status: Former Smoker    Types: Cigarettes    Quit date: 06/08/2010  . Smokeless tobacco: Never Used  . Alcohol use Yes     Comment: rare beer or glass of wine  .  Drug use: No  . Sexual activity: Not Asked   Other Topics Concern  . None   Social History Narrative  . None     Family History:  The patient's family history includes AAA (abdominal aortic aneurysm) in his father; CAD in his father; Heart attack in his father.   ROS:   Please see the history of present illness.    Some dyspnea. Occasional irregular heartbeat. Decreased hearing. Dizziness. All other systems reviewed and are negative.   PHYSICAL EXAM:   VS:  BP 116/62   Pulse 73   Ht 5\' 10"  (1.778 m)   Wt 212 lb 6.4 oz (96.3 kg)   BMI 30.48 kg/m    GEN: Well nourished, well developed, in no acute distress  HEENT: normal  Neck: no JVD, carotid bruits, or masses Cardiac: RRR; no murmurs, rubs, or gallops,no edema  Respiratory:  clear to auscultation bilaterally, normal work of breathing GI: soft, nontender, nondistended, + BS MS: no deformity or atrophy  Skin: warm and dry, no rash Neuro:  Alert and Oriented x 3, Strength and sensation are intact Psych: euthymic mood, full affect  Wt Readings from Last 3 Encounters:  02/04/16 212 lb 6.4 oz (96.3 kg)  12/14/15 206 lb 12.8 oz (93.8 kg)  06/23/15 215 lb (97.5 kg)      Studies/Labs Reviewed:   EKG:  EKG  Not repeated  Recent Labs: 06/08/2015: BUN 23; Creatinine, Ser 1.02; Hemoglobin 13.9; Platelets 218; Potassium 4.4; Sodium 135 12/14/2015: Magnesium 1.6; TSH 0.88   Lipid Panel No results found for: CHOL, TRIG, HDL, CHOLHDL, VLDL, LDLCALC, LDLDIRECT  Additional studies/ records that were reviewed today include:  The continuous ambulatory monitor 2017, revealed episodes of ventricular bigeminy that correlated with the patient's complaint of dizziness and palpitations.  The echocardiogram 2017 done at the same time did not reveal any evidence of significant structural abnormality. LV function was normal.  Cardiac catheterization 2015: Performed by Dr. Martinique Final Conclusions:   1. Mild nonobstructive CAD 2. Normal  LV function.  ASSESSMENT:    1. Ventricular bigeminy   2. Obstructive sleep apnea   3. Coronary artery disease involving native coronary artery of native heart without angina pectoris   4. Essential hypertension, benign   5. HLD (hyperlipidemia)      PLAN:  In order of problems listed above:  1. After stopping the study drug Semaglutide vs Canagliflozin, PVCs dissipated and episodes of dizziness resolved. Continue to observe. Return in one year for follow-up or earlier if recurrent symptoms. 2. Continue C PAP 3. No clinical evidence for coronary ischemia/symptoms. Continue risk factor modification. 4. Low-salt diet. Aerobic activity. Target blood pressure less than 140/90 mmHg.    Medication Adjustments/Labs and Tests  Ordered: Current medicines are reviewed at length with the patient today.  Concerns regarding medicines are outlined above.  Medication changes, Labs and Tests ordered today are listed in the Patient Instructions below. Patient Instructions  Medication Instructions:  Your physician recommends that you continue on your current medications as directed. Please refer to the Current Medication list given to you today.   Labwork: None ordered  Testing/Procedures: None ordered  Follow-Up: Your physician wants you to follow-up in: 1 year with Dr.Smith You will receive a reminder letter in the mail two months in advance. If you don't receive a letter, please call our office to schedule the follow-up appointment.   Any Other Special Instructions Will Be Listed Below (If Applicable).     If you need a refill on your cardiac medications before your next appointment, please call your pharmacy.      Signed, Sinclair Grooms, MD  02/04/2016 2:55 PM    [place], [place], [place]  [address] Phone: [phone]; Fax: [phone]

## 2016-02-04 NOTE — Patient Instructions (Signed)

## 2016-04-11 DIAGNOSIS — H6121 Impacted cerumen, right ear: Secondary | ICD-10-CM | POA: Diagnosis not present

## 2016-04-11 DIAGNOSIS — Z23 Encounter for immunization: Secondary | ICD-10-CM | POA: Diagnosis not present

## 2016-05-15 ENCOUNTER — Encounter: Payer: Self-pay | Admitting: *Deleted

## 2016-05-30 ENCOUNTER — Encounter: Payer: Self-pay | Admitting: Cardiology

## 2016-05-30 ENCOUNTER — Ambulatory Visit (INDEPENDENT_AMBULATORY_CARE_PROVIDER_SITE_OTHER): Payer: Medicare Other | Admitting: Cardiology

## 2016-05-30 VITALS — BP 124/74 | HR 71 | Ht 70.0 in | Wt 211.6 lb

## 2016-05-30 DIAGNOSIS — I2510 Atherosclerotic heart disease of native coronary artery without angina pectoris: Secondary | ICD-10-CM

## 2016-05-30 DIAGNOSIS — E7800 Pure hypercholesterolemia, unspecified: Secondary | ICD-10-CM

## 2016-05-30 DIAGNOSIS — I10 Essential (primary) hypertension: Secondary | ICD-10-CM

## 2016-05-30 DIAGNOSIS — G4733 Obstructive sleep apnea (adult) (pediatric): Secondary | ICD-10-CM

## 2016-05-30 NOTE — Patient Instructions (Signed)
Medication Instructions:  Your physician recommends that you continue on your current medications as directed. Please refer to the Current Medication list given to you today.     Labwork: NOne  Testing/Procedures: None  Follow-Up: Your physician wants you to follow-up in: 1 year with Dr. Radford Pax. You will receive a reminder letter in the mail two months in advance. If you don't receive a letter, please call our office to schedule the follow-up appointment.   Any Other Special Instructions Will Be Listed Below (If Applicable).     If you need a refill on your cardiac medications before your next appointment, please call your pharmacy.

## 2016-05-30 NOTE — Progress Notes (Signed)
Cardiology Office Note    Date:  05/30/2016   ID:  Carl Knapp 1946/02/12, MRN [id number]  PCP:  Vena Austria, MD  Cardiologist:  Carl Him, MD   Chief Complaint  Patient presents with  . Sleep Apnea  . Hypertension    History of Present Illness:  Carl Knapp is a 71 y.o. male with a history of sleep apnea,HTN.   He presents today for followup. He continues to use his CPAP machine and tolerates it well. He uses a nasal pillow mask which he tolerates well. He feels rested in the am but occasionally has some daytime sleepiness.He sleeps very well at night. He denies any mouth or nasal dryness and no head congestion other than with his allergies.    Past Medical History:  Diagnosis Date  . Coronary artery disease 12/2013   20-30% RCA by cath- states was good.  . Diabetes mellitus without complication ([place])    type II  . DVT (deep venous thrombosis) ([place])    bilateral legs-greater on left. -3-5 years ago-tx. coumadin x 1 year.  Marland Kitchen Hx of cardiovascular stress test    ETT-Myoview (7/15):  ECG with ST depression; freq PVCs, + chest pain; normal perfusion  . Hyperlipidemia    LDL goal<100  . Hypertension   . Macular degeneration    mild  . OSA (obstructive sleep apnea)    Severe w AHI 37/hr now on CPAP at 10cm H2O    Past Surgical History:  Procedure Laterality Date  . APPENDECTOMY     open '68  . HERNIA REPAIR Bilateral    inguinal hernia repair '80  . LEFT HEART CATHETERIZATION WITH CORONARY ANGIOGRAM N/A 12/09/2013   Procedure: LEFT HEART CATHETERIZATION WITH CORONARY ANGIOGRAM;  Surgeon: Jettie Booze, MD;  Location: [facility] CATH LAB;  Service: Cardiovascular;  Laterality: N/A;  . VASECTOMY      Current Medications: Outpatient Medications Prior to Visit  Medication Sig Dispense Refill  . amLODipine (NORVASC) 10 MG tablet Take 10 mg by mouth daily.    Marland Kitchen aspirin EC 81 MG tablet Take 81 mg by mouth daily.    Marland Kitchen KLOR-CON M20 20 MEQ tablet Take  40 mEq by mouth daily.     Marland Kitchen lisinopril-hydrochlorothiazide (PRINZIDE,ZESTORETIC) 20-12.5 MG per tablet Take 2 tablets by mouth daily.    . metFORMIN (GLUCOPHAGE) 1000 MG tablet Take 1,000 mg by mouth 2 (two) times daily with a meal.     . metoprolol succinate (TOPROL-XL) 25 MG 24 hr tablet TAKE ONE TABLET BY MOUTH ONCE DAILY 90 tablet 3  . Multiple Vitamins-Minerals (PRESERVISION AREDS 2 PO) Take 1 capsule by mouth 2 (two) times daily.    . nitroGLYCERIN (NITROSTAT) 0.4 MG SL tablet Place 1 tablet (0.4 mg total) under the tongue every 5 (five) minutes as needed for chest pain. 25 tablet 5  . simvastatin (ZOCOR) 20 MG tablet Take 20 mg by mouth every morning.      No facility-administered medications prior to visit.      Allergies:   Patient has no known allergies.   Social History   Social History  . Marital status: Married    Spouse name: N/A  . Number of children: N/A  . Years of education: N/A   Social History Main Topics  . Smoking status: Former Smoker    Types: Cigarettes    Quit date: 06/08/2010  . Smokeless tobacco: Never Used  . Alcohol use Yes     Comment: rare  beer or glass of wine  . Drug use: No  . Sexual activity: Not Asked   Other Topics Concern  . None   Social History Narrative  . None     Family History:  The patient's family history includes AAA (abdominal aortic aneurysm) in his father; CAD in his father; Heart attack in his father; Hypertension in his other.   ROS:   Please see the history of present illness.    ROS All other systems reviewed and are negative.  No flowsheet data found.     PHYSICAL EXAM:   VS:  BP 124/74   Pulse 71   Ht 5\' 10"  (1.778 m)   Wt 211 lb 9.6 oz (96 kg)   BMI 30.36 kg/m    GEN: Well nourished, well developed, in no acute distress  HEENT: normal  Neck: no JVD, carotid bruits, or masses Cardiac: RRR; no murmurs, rubs, or gallops,no edema.  Intact distal pulses bilaterally.  Respiratory:  clear to auscultation  bilaterally, normal work of breathing GI: soft, nontender, nondistended, + BS MS: no deformity or atrophy  Skin: warm and dry, no rash Neuro:  Alert and Oriented x 3, Strength and sensation are intact Psych: euthymic mood, full affect  Wt Readings from Last 3 Encounters:  05/30/16 211 lb 9.6 oz (96 kg)  02/04/16 212 lb 6.4 oz (96.3 kg)  12/14/15 206 lb 12.8 oz (93.8 kg)      Studies/Labs Reviewed:   EKG:  EKG is not ordered today.    Recent Labs: 06/08/2015: BUN 23; Creatinine, Ser 1.02; Hemoglobin 13.9; Platelets 218; Potassium 4.4; Sodium 135 12/14/2015: Magnesium 1.6; TSH 0.88   Lipid Panel No results found for: CHOL, TRIG, HDL, CHOLHDL, VLDL, LDLCALC, LDLDIRECT  Additional studies/ records that were reviewed today include:  CPAP download    ASSESSMENT:    1. Obstructive sleep apnea   2. Coronary artery disease involving native coronary artery of native heart without angina pectoris   3. Essential hypertension, benign   4. Pure hypercholesterolemia      PLAN:  In order of problems listed above:  OSA - the patient is tolerating PAP therapy well without any problems. The PAP download was reviewed today and showed an AHI of 2.3/hr on 11 cm H2O with 80% compliance in using more than 4 hours nightly.  The patient has been using and benefiting from CPAP use and will continue to benefit from therapy.  HTN - BP controlled on current meds.  Continue amlodipine/ACE I/diuretic/BB.  Check BMET.  Obesity - I have encouraged Knapp to get into a routine exercise program and cut back on carbs and portions.      Medication Adjustments/Labs and Tests Ordered: Current medicines are reviewed at length with the patient today.  Concerns regarding medicines are outlined above.  Medication changes, Labs and Tests ordered today are listed in the Patient Instructions below.  There are no Patient Instructions on file for this visit.   Signed, Carl Him, MD  05/30/2016 8:26 AM    [place], [place],   [address] Phone: [phone]; Fax: [phone]

## 2016-05-31 ENCOUNTER — Telehealth: Payer: Self-pay | Admitting: Cardiology

## 2016-05-31 NOTE — Telephone Encounter (Signed)
New Message    Please call wants to make sure you have the correct information about his cpap machine , his cpap machine does not transmit data

## 2016-06-02 NOTE — Telephone Encounter (Signed)
Called and talked to the patient and he wants to come by the office on Monday 1/ 29/2018 with his chip so we can get a download

## 2016-06-08 ENCOUNTER — Telehealth: Payer: Self-pay | Admitting: *Deleted

## 2016-06-08 NOTE — Telephone Encounter (Signed)
Called the patient and gave him his sleep results, he verbalized understanding and agreed

## 2016-06-08 NOTE — Telephone Encounter (Signed)
-----   Message from Sueanne Margarita, MD sent at 06/08/2016  2:30 PM EST ----- Good AHI and compliance.  Continue current CPAP settings.

## 2016-06-08 NOTE — Telephone Encounter (Signed)
Called and left a message on vm  for the patient to return my call

## 2016-06-14 DIAGNOSIS — E119 Type 2 diabetes mellitus without complications: Secondary | ICD-10-CM | POA: Diagnosis not present

## 2016-06-14 DIAGNOSIS — Z7984 Long term (current) use of oral hypoglycemic drugs: Secondary | ICD-10-CM | POA: Diagnosis not present

## 2016-06-14 DIAGNOSIS — I10 Essential (primary) hypertension: Secondary | ICD-10-CM | POA: Diagnosis not present

## 2016-06-14 DIAGNOSIS — E7800 Pure hypercholesterolemia, unspecified: Secondary | ICD-10-CM | POA: Diagnosis not present

## 2016-06-14 DIAGNOSIS — M7712 Lateral epicondylitis, left elbow: Secondary | ICD-10-CM | POA: Diagnosis not present

## 2016-07-17 DIAGNOSIS — G4730 Sleep apnea, unspecified: Secondary | ICD-10-CM | POA: Diagnosis not present

## 2016-07-17 DIAGNOSIS — N529 Male erectile dysfunction, unspecified: Secondary | ICD-10-CM | POA: Diagnosis not present

## 2016-08-16 DIAGNOSIS — E1165 Type 2 diabetes mellitus with hyperglycemia: Secondary | ICD-10-CM | POA: Diagnosis not present

## 2016-08-16 DIAGNOSIS — R55 Syncope and collapse: Secondary | ICD-10-CM | POA: Diagnosis not present

## 2016-08-28 ENCOUNTER — Other Ambulatory Visit: Payer: Self-pay | Admitting: Cardiology

## 2016-11-25 DIAGNOSIS — B349 Viral infection, unspecified: Secondary | ICD-10-CM | POA: Diagnosis not present

## 2016-11-25 DIAGNOSIS — R509 Fever, unspecified: Secondary | ICD-10-CM | POA: Diagnosis not present

## 2016-12-06 LAB — HM HEPATITIS C SCREENING LAB: HM Hepatitis Screen: NEGATIVE

## 2016-12-26 DIAGNOSIS — Z1159 Encounter for screening for other viral diseases: Secondary | ICD-10-CM | POA: Diagnosis not present

## 2016-12-26 DIAGNOSIS — Z125 Encounter for screening for malignant neoplasm of prostate: Secondary | ICD-10-CM | POA: Diagnosis not present

## 2016-12-26 DIAGNOSIS — E7800 Pure hypercholesterolemia, unspecified: Secondary | ICD-10-CM | POA: Diagnosis not present

## 2016-12-26 DIAGNOSIS — R894 Abnormal immunological findings in specimens from other organs, systems and tissues: Secondary | ICD-10-CM | POA: Diagnosis not present

## 2016-12-26 DIAGNOSIS — Z23 Encounter for immunization: Secondary | ICD-10-CM | POA: Diagnosis not present

## 2016-12-26 DIAGNOSIS — R55 Syncope and collapse: Secondary | ICD-10-CM | POA: Diagnosis not present

## 2016-12-26 DIAGNOSIS — I10 Essential (primary) hypertension: Secondary | ICD-10-CM | POA: Diagnosis not present

## 2016-12-26 DIAGNOSIS — N183 Chronic kidney disease, stage 3 (moderate): Secondary | ICD-10-CM | POA: Diagnosis not present

## 2016-12-26 DIAGNOSIS — M549 Dorsalgia, unspecified: Secondary | ICD-10-CM | POA: Diagnosis not present

## 2016-12-26 DIAGNOSIS — Z6830 Body mass index (BMI) 30.0-30.9, adult: Secondary | ICD-10-CM | POA: Diagnosis not present

## 2016-12-26 DIAGNOSIS — Z1389 Encounter for screening for other disorder: Secondary | ICD-10-CM | POA: Diagnosis not present

## 2016-12-26 DIAGNOSIS — Z0000 Encounter for general adult medical examination without abnormal findings: Secondary | ICD-10-CM | POA: Diagnosis not present

## 2016-12-26 DIAGNOSIS — I25119 Atherosclerotic heart disease of native coronary artery with unspecified angina pectoris: Secondary | ICD-10-CM | POA: Diagnosis not present

## 2016-12-26 DIAGNOSIS — E1129 Type 2 diabetes mellitus with other diabetic kidney complication: Secondary | ICD-10-CM | POA: Diagnosis not present

## 2017-04-14 DIAGNOSIS — B353 Tinea pedis: Secondary | ICD-10-CM | POA: Diagnosis not present

## 2017-04-14 DIAGNOSIS — L03116 Cellulitis of left lower limb: Secondary | ICD-10-CM | POA: Diagnosis not present

## 2017-04-14 DIAGNOSIS — E119 Type 2 diabetes mellitus without complications: Secondary | ICD-10-CM | POA: Diagnosis not present

## 2017-04-23 DIAGNOSIS — E119 Type 2 diabetes mellitus without complications: Secondary | ICD-10-CM | POA: Diagnosis not present

## 2017-04-23 DIAGNOSIS — B353 Tinea pedis: Secondary | ICD-10-CM | POA: Diagnosis not present

## 2017-04-23 DIAGNOSIS — L03116 Cellulitis of left lower limb: Secondary | ICD-10-CM | POA: Diagnosis not present

## 2017-05-17 ENCOUNTER — Ambulatory Visit: Payer: Self-pay | Admitting: Surgery

## 2017-05-17 DIAGNOSIS — G4733 Obstructive sleep apnea (adult) (pediatric): Secondary | ICD-10-CM | POA: Diagnosis not present

## 2017-05-17 DIAGNOSIS — K429 Umbilical hernia without obstruction or gangrene: Secondary | ICD-10-CM | POA: Diagnosis not present

## 2017-05-29 NOTE — Progress Notes (Signed)
Cardiology Office Note:    Date:  05/30/2017   ID:  Carl Knapp, DOB April 10, 1946, MRN [id number]  PCP:  Maury Dus, MD  Cardiologist:  No primary care provider on file.    Referring MD: Maury Dus, MD   Chief Complaint  Patient presents with  . Sleep Apnea  . Hypertension    History of Present Illness:    Carl Knapp is a 72 y.o. male with a hx of severe OSA with AHI 37/hr and now on CPAP at 10cm H2O.  He is doing well with his CPAP device.Marland Kitchen  He tolerates the nasal pillow mask with no chin strap and feels the pressure is adequate.  Since going on CPAP He feels rested in the am and has no significant daytime sleepiness.  He denies any significant mouth or nasal dryness or nasal congestion.  He does not think that he snores.  He says that occasionally when he plays pickle ball he will get some tunnel vision but no syncope or palpitations.     Past Medical History:  Diagnosis Date  . Coronary artery disease 12/2013   20-30% RCA by cath- states was good.  . Diabetes mellitus without complication ([place])    type II  . DVT (deep venous thrombosis) ([place])    bilateral legs-greater on left. -3-5 years ago-tx. coumadin x 1 year.  Marland Kitchen Hx of cardiovascular stress test    ETT-Myoview (7/15):  ECG with ST depression; freq PVCs, + chest pain; normal perfusion  . Hyperlipidemia    LDL goal<100  . Hypertension   . Macular degeneration    mild  . OSA (obstructive sleep apnea)    Severe w AHI 37/hr now on CPAP at 10cm H2O    Past Surgical History:  Procedure Laterality Date  . APPENDECTOMY     open '68  . HERNIA REPAIR Bilateral    inguinal hernia repair '80  . LEFT HEART CATHETERIZATION WITH CORONARY ANGIOGRAM N/A 12/09/2013   Procedure: LEFT HEART CATHETERIZATION WITH CORONARY ANGIOGRAM;  Surgeon: Jettie Booze, MD;  Location: [facility] CATH LAB;  Service: Cardiovascular;  Laterality: N/A;  . VASECTOMY      Current Medications: Current Meds  Medication Sig  . amLODipine (NORVASC)  10 MG tablet Take 10 mg by mouth daily.  Marland Kitchen aspirin EC 81 MG tablet Take 81 mg by mouth daily.  Marland Kitchen KLOR-CON M20 20 MEQ tablet Take 40 mEq by mouth daily.   Marland Kitchen lisinopril-hydrochlorothiazide (PRINZIDE,ZESTORETIC) 20-12.5 MG per tablet Take 2 tablets by mouth daily.  . metFORMIN (GLUCOPHAGE) 1000 MG tablet Take 1,000 mg by mouth 2 (two) times daily with a meal.   . metoprolol succinate (TOPROL-XL) 25 MG 24 hr tablet TAKE ONE TABLET BY MOUTH ONCE DAILY  . nitroGLYCERIN (NITROSTAT) 0.4 MG SL tablet Place 1 tablet (0.4 mg total) under the tongue every 5 (five) minutes as needed for chest pain.  . simvastatin (ZOCOR) 20 MG tablet Take 20 mg by mouth every morning.      Allergies:   Patient has no known allergies.   Social History   Socioeconomic History  . Marital status: Married    Spouse name: None  . Number of children: None  . Years of education: None  . Highest education level: None  Social Needs  . Financial resource strain: None  . Food insecurity - worry: None  . Food insecurity - inability: None  . Transportation needs - medical: None  . Transportation needs - non-medical: None  Occupational  History  . None  Tobacco Use  . Smoking status: Former Smoker    Types: Cigarettes    Last attempt to quit: 06/08/2010    Years since quitting: 6.9  . Smokeless tobacco: Never Used  Substance and Sexual Activity  . Alcohol use: Yes    Comment: rare beer or glass of wine  . Drug use: No  . Sexual activity: None  Other Topics Concern  . None  Social History Narrative  . None     Family History: The patient's family history includes AAA (abdominal aortic aneurysm) in his father; CAD in his father; Heart attack in his father; Hypertension in his other.  ROS:   Please see the history of present illness.    ROS  All other systems reviewed and negative.   EKGs/Labs/Other Studies Reviewed:    The following studies were reviewed today: CPAP download  EKG:  EKG is not ordered today.     Recent Labs: No results found for requested labs within last 8760 hours.   Recent Lipid Panel No results found for: CHOL, TRIG, HDL, CHOLHDL, VLDL, LDLCALC, LDLDIRECT  Physical Exam:    VS:  BP (!) 148/68   Pulse (!) 45   Ht 5\' 10"  (1.778 m)   Wt 222 lb 8 oz (100.9 kg)   SpO2 97%   BMI 31.93 kg/m     Wt Readings from Last 3 Encounters:  05/30/17 222 lb 8 oz (100.9 kg)  05/30/16 211 lb 9.6 oz (96 kg)  02/04/16 212 lb 6.4 oz (96.3 kg)     GEN:  Well nourished, well developed in no acute distress HEENT: Normal NECK: No JVD; No carotid bruits LYMPHATICS: No lymphadenopathy CARDIAC: RRR, no murmurs, rubs, gallops RESPIRATORY:  Clear to auscultation without rales, wheezing or rhonchi  ABDOMEN: Soft, non-tender, non-distended MUSCULOSKELETAL:  No edema; No deformity  SKIN: Warm and dry NEUROLOGIC:  Alert and oriented x 3 PSYCHIATRIC:  Normal affect   ASSESSMENT:    1. Obstructive sleep apnea   2. Obesity (BMI 30-39.9)   3. Essential hypertension, benign    PLAN:    In order of problems listed above:  1.  OSA - the patient is tolerating PAP therapy well without any problems. The PAP download was reviewed today and showed an AHI of 1.8/hr on 11 cm H2O with 78% compliance in using more than 4 hours nightly.  The patient has been using and benefiting from PAP use and will continue to benefit from therapy.   2.  Obesity - I have encouraged him to get into a routine exercise program and cut back on carbs and portions.   3.  HTN - BP is borderline controlled on exam today but at home runs 120/21mmHg.  He will continue on  amlodipine 10mg  daily and Lisinopril-HCT 20-12.5mg  daily.  I am going to stop his Toprol given his bradycardia and intermittent visual problems with playing tennis.  I instructed him to see his PCP back if he has tunnel vision again.  I will check a BMET.   Medication Adjustments/Labs and Tests Ordered: Current medicines are reviewed at length with the  patient today.  Concerns regarding medicines are outlined above.  No orders of the defined types were placed in this encounter.  No orders of the defined types were placed in this encounter.   Signed, Fransico Him, MD  05/30/2017 8:33 AM    [place]

## 2017-05-30 ENCOUNTER — Ambulatory Visit (INDEPENDENT_AMBULATORY_CARE_PROVIDER_SITE_OTHER): Payer: Medicare Other | Admitting: Cardiology

## 2017-05-30 ENCOUNTER — Encounter: Payer: Self-pay | Admitting: Cardiology

## 2017-05-30 VITALS — BP 148/68 | HR 45 | Ht 70.0 in | Wt 222.5 lb

## 2017-05-30 DIAGNOSIS — I10 Essential (primary) hypertension: Secondary | ICD-10-CM | POA: Diagnosis not present

## 2017-05-30 DIAGNOSIS — G4733 Obstructive sleep apnea (adult) (pediatric): Secondary | ICD-10-CM | POA: Diagnosis not present

## 2017-05-30 DIAGNOSIS — E669 Obesity, unspecified: Secondary | ICD-10-CM | POA: Diagnosis not present

## 2017-05-30 LAB — BASIC METABOLIC PANEL
BUN / CREAT RATIO: 19 (ref 10–24)
BUN: 22 mg/dL (ref 8–27)
CO2: 24 mmol/L (ref 20–29)
CREATININE: 1.14 mg/dL (ref 0.76–1.27)
Calcium: 9.2 mg/dL (ref 8.6–10.2)
Chloride: 101 mmol/L (ref 96–106)
GFR calc Af Amer: 75 mL/min/{1.73_m2} (ref >59)
GFR, EST NON AFRICAN AMERICAN: 65 mL/min/{1.73_m2} (ref >59)
GLUCOSE: 255 mg/dL — AB (ref 65–99)
Potassium: 4.1 mmol/L (ref 3.5–5.2)
SODIUM: 140 mmol/L (ref 134–144)

## 2017-05-30 NOTE — Patient Instructions (Addendum)
Medication Instructions:  Your physician has recommended you make the following change in your medication:  STOP: Toprol   If you need a refill on your cardiac medications, please contact your pharmacy first.  Labwork: Today for kidney function test   Testing/Procedures: None ordered   Follow-Up: Your physician wants you to follow-up in: 1 year with Dr. Radford Pax. You will receive a reminder letter in the mail two months in advance. If you don't receive a letter, please call our office to schedule the follow-up appointment.  Any Other Special Instructions Will Be Listed Below (If Applicable). Your physician recommends that you check your blood pressure once a day for 1 week and call office at [phone] with results   How to Take Your Blood Pressure You can take your blood pressure at home with a machine. You may need to check your blood pressure at home:  To check if you have high blood pressure (hypertension).  To check your blood pressure over time.  To make sure your blood pressure medicine is working.  Supplies needed: You will need a blood pressure machine, or monitor. You can buy one at a drugstore or online. When choosing one:  Choose one with an arm cuff.  Choose one that wraps around your upper arm. Only one finger should fit between your arm and the cuff.  Do not choose one that measures your blood pressure from your wrist or finger.  Your doctor can suggest a monitor. How to prepare Avoid these things for 30 minutes before checking your blood pressure:  Drinking caffeine.  Drinking alcohol.  Eating.  Smoking.  Exercising.  Five minutes before checking your blood pressure:  Pee.  Sit in a dining chair. Avoid sitting in a soft couch or armchair.  Be quiet. Do not talk.  How to take your blood pressure Follow the instructions that came with your machine. If you have a digital blood pressure monitor, these may be the instructions: 1. Sit up  straight. 2. Place your feet on the floor. Do not cross your ankles or legs. 3. Rest your left arm at the level of your heart. You may rest it on a table, desk, or chair. 4. Pull up your shirt sleeve. 5. Wrap the blood pressure cuff around the upper part of your left arm. The cuff should be 1 inch (2.5 cm) above your elbow. It is best to wrap the cuff around bare skin. 6. Fit the cuff snugly around your arm. You should be able to place only one finger between the cuff and your arm. 7. Put the cord inside the groove of your elbow. 8. Press the power button. 9. Sit quietly while the cuff fills with air and loses air. 10. Write down the numbers on the screen. 11. Wait 2-3 minutes and then repeat steps 1-10.  What do the numbers mean? Two numbers make up your blood pressure. The first number is called systolic pressure. The second is called diastolic pressure. An example of a blood pressure reading is "120 over 80" (or 120/80). If you are an adult and do not have a medical condition, use this guide to find out if your blood pressure is normal: Normal  First number: below 120.  Second number: below 80. Elevated  First number: 120-129.  Second number: below 80. Hypertension stage 1  First number: 130-139.  Second number: 80-89. Hypertension stage 2  First number: 140 or above.  Second number: 103 or above. Your blood pressure is above normal  even if only the top or bottom number is above normal. Follow these instructions at home:  Check your blood pressure as often as your doctor tells you to.  Take your monitor to your next doctor's appointment. Your doctor will: ? Make sure you are using it correctly. ? Make sure it is working right.  Make sure you understand what your blood pressure numbers should be.  Tell your doctor if your medicines are causing side effects. Contact a doctor if:  Your blood pressure keeps being high. Get help right away if:  Your first blood  pressure number is higher than 180.  Your second blood pressure number is higher than 120. This information is not intended to replace advice given to you by your health care provider. Make sure you discuss any questions you have with your health care provider. Document Released: 04/06/2008 Document Revised: 03/22/2016 Document Reviewed: 10/01/2015 Elsevier Interactive Patient Education  2018 [place].  Blood Pressure Record Sheet Your blood pressure on this visit to the emergency department or clinic is elevated. This does not necessarily mean you have high blood pressure (hypertension), but it does mean that your blood pressure needs to be rechecked. Many times your blood pressure can increase due to illness, pain, anxiety, or other factors. We recommend that you get a series of blood pressure readings done over a period of 5 days. It is best to get a reading in the morning and one in the evening. You should make sure to sit and relax for 1-5 minutes before the reading is taken. Write the readings down and make a follow-up appointment with your health care provider to discuss the results. If there is not a free clinic or a drug store with a blood-pressure-taking machine near you, you can purchase blood-pressure-taking equipment from a drug store. Having one in the home allows you the convenience of taking your blood pressure while you are home and relaxed. Blood Pressure Log Date: _______________________  a.m. _____________________  p.m. _____________________  Date: _______________________  a.m. _____________________  p.m. _____________________  Date: _______________________  a.m. _____________________  p.m. _____________________  Date: _______________________  a.m. _____________________  p.m. _____________________  Date: _______________________  a.m. _____________________  p.m. _____________________  This information is not intended to replace advice given to you by  your health care provider. Make sure you discuss any questions you have with your health care provider. Document Released: 01/21/2003 Document Revised: 04/07/2016 Document Reviewed: 06/17/2013 Elsevier Interactive Patient Education  [employer].      If you need a refill on your cardiac medications before your next appointment, please call your pharmacy.

## 2017-06-05 NOTE — Patient Instructions (Addendum)
DEVRON COHICK  06/05/2017   Your procedure is scheduled on: 06-14-17  Report to [facility] Main  Entrance  Follow signs to Short Stay on first floor at 530 AM   Call this number if you have problems the morning of surgery [phone]      NO SOLID FOOD AFTER MIDNIGHT THE NIGHT PRIOR TO [place]. NOTHING BY MOUTH EXCEPT CLEAR LIQUIDS UNTIL 3 HOURS PRIOR TO [place] SURGERY. PLEASE FINISH ENSURE DRINK PER SURGEON ORDER 3 HOURS PRIOR TO SCHEDULED SURGERY TIME WHICH NEEDS TO BE COMPLETED AT ___430am_________.   Take these medicines the morning of surgery with A SIP OF WATER: SIMVASTATIN, AMLODIPINE  DO NOT TAKE ANY DIABETIC MEDICATIONS DAY OF YOUR SURGERY                               You may not have any metal on your body including hair pins and              piercings  Do not wear jewelry, lotions, powders or perfumes, deodorant                        Men may shave face and neck.   Do not bring valuables to the hospital. [place].  Contacts, dentures or bridgework may not be worn into surgery.  Leave suitcase in the car. After surgery it may be brought to your room.                Please read over the following fact sheets you were given: _____________________________________________________________________           [facility] - Preparing for Surgery Before surgery, you can play an important role.  Because skin is not sterile, your skin needs to be as free of germs as possible.  You can reduce the number of germs on your skin by washing with CHG (chlorahexidine gluconate) soap before surgery.  CHG is an antiseptic cleaner which kills germs and bonds with the skin to continue killing germs even after washing. Please DO NOT use if you have an allergy to CHG or antibacterial soaps.  If your skin becomes reddened/irritated stop using the CHG and inform your nurse when you arrive at Short Stay. Do not shave  (including legs and underarms) for at least 48 hours prior to the first CHG shower.  You may shave your face/neck. Please follow these instructions carefully:  1.  Shower with CHG Soap the night before surgery and the  morning of Surgery.  2.  If you choose to wash your hair, wash your hair first as usual with your  normal  shampoo.  3.  After you shampoo, rinse your hair and body thoroughly to remove the  shampoo.                           4.  Use CHG as you would any other liquid soap.  You can apply chg directly  to the skin and wash                       Gently with a scrungie or clean washcloth.  5.  Apply  the CHG Soap to your body ONLY FROM THE NECK DOWN.   Do not use on face/ open                           Wound or open sores. Avoid contact with eyes, ears mouth and genitals (private parts).                       Wash face,  Genitals (private parts) with your normal soap.             6.  Wash thoroughly, paying special attention to the area where your surgery  will be performed.  7.  Thoroughly rinse your body with warm water from the neck down.  8.  DO NOT shower/wash with your normal soap after using and rinsing off  the CHG Soap.                9.  Pat yourself dry with a clean towel.            10.  Wear clean pajamas.            11.  Place clean sheets on your bed the night of your first shower and do not  sleep with pets. Day of Surgery : Do not apply any lotions/deodorants the morning of surgery.  Please wear clean clothes to the hospital/surgery center.  FAILURE TO FOLLOW THESE INSTRUCTIONS MAY RESULT IN THE CANCELLATION OF YOUR SURGERY PATIENT SIGNATURE_________________________________  NURSE SIGNATURE__________________________________  ________________________________________________________________________

## 2017-06-11 ENCOUNTER — Other Ambulatory Visit: Payer: Self-pay

## 2017-06-11 ENCOUNTER — Encounter (HOSPITAL_COMMUNITY): Payer: Self-pay

## 2017-06-11 ENCOUNTER — Encounter (HOSPITAL_COMMUNITY)
Admission: RE | Admit: 2017-06-11 | Discharge: 2017-06-11 | Disposition: A | Discharge status: Home / Self Care | Payer: Medicare Other | Source: Ambulatory Visit | Attending: Surgery | Admitting: Surgery

## 2017-06-11 DIAGNOSIS — K420 Umbilical hernia with obstruction, without gangrene: Secondary | ICD-10-CM | POA: Diagnosis not present

## 2017-06-11 DIAGNOSIS — Z87891 Personal history of nicotine dependence: Secondary | ICD-10-CM | POA: Diagnosis not present

## 2017-06-11 DIAGNOSIS — Z79899 Other long term (current) drug therapy: Secondary | ICD-10-CM | POA: Diagnosis not present

## 2017-06-11 DIAGNOSIS — I2510 Atherosclerotic heart disease of native coronary artery without angina pectoris: Secondary | ICD-10-CM | POA: Diagnosis not present

## 2017-06-11 DIAGNOSIS — G4733 Obstructive sleep apnea (adult) (pediatric): Secondary | ICD-10-CM | POA: Diagnosis not present

## 2017-06-11 DIAGNOSIS — I10 Essential (primary) hypertension: Secondary | ICD-10-CM | POA: Diagnosis not present

## 2017-06-11 DIAGNOSIS — H3530 Unspecified macular degeneration: Secondary | ICD-10-CM | POA: Diagnosis not present

## 2017-06-11 DIAGNOSIS — E119 Type 2 diabetes mellitus without complications: Secondary | ICD-10-CM | POA: Diagnosis not present

## 2017-06-11 DIAGNOSIS — Z9049 Acquired absence of other specified parts of digestive tract: Secondary | ICD-10-CM | POA: Diagnosis not present

## 2017-06-11 LAB — CBC
HEMATOCRIT: 37.8 % — AB (ref 39.0–52.0)
Hemoglobin: 13 g/dL (ref 13.0–17.0)
MCH: 32.1 pg (ref 26.0–34.0)
MCHC: 34.4 g/dL (ref 30.0–36.0)
MCV: 93.3 fL (ref 78.0–100.0)
Platelets: 195 10*3/uL (ref 150–400)
RBC: 4.05 MIL/uL — ABNORMAL LOW (ref 4.22–5.81)
RDW: 12.6 % (ref 11.5–15.5)
WBC: 4.5 10*3/uL (ref 4.0–10.5)

## 2017-06-11 LAB — HEMOGLOBIN A1C
Hgb A1c MFr Bld: 6.9 % — ABNORMAL HIGH (ref 4.8–5.6)
MEAN PLASMA GLUCOSE: 151.33 mg/dL

## 2017-06-11 LAB — GLUCOSE, CAPILLARY: GLUCOSE-CAPILLARY: 239 mg/dL — AB (ref 65–99)

## 2017-06-11 NOTE — Progress Notes (Addendum)
LOV DR. Radford Pax 05-30-17 Epic  EKG 08-2016 chart  BMP 05-30-17 Epic  ECHO 12-15-15 EPIC

## 2017-06-13 NOTE — H&P (Signed)
Carl Knapp  Location: [facility] Surgery Patient #: [id number] DOB: 1946-02-08 Single / Language: Carl Knapp / Race: White Male  History of Present Illness   The patient is a 72 year old male who presents with an umbilical hernia.  His PCP is Dr. Lenard Simmer.  He comes with wife.   I tried to schedule the repair of his umbilical hernia in 1950, but he had an elevated HgbA1C which canceled his surgery.  He thinks that the hernia is a little larger than when I last saw him. He is playing paddleball, dancing, and is more active.  I discussed the indications and complications of hernia surgery with the patient. I discussed both the laparoscopic and open approach to hernia repair. The potential risks of hernia surgery include, but are not limited to, bleeding, infection, open surgery, nerve injury, and recurrence of the hernia.   I provided the patient literature about hernia surgery.  From prior visit: He has noticed an umbilical hernia for years, but in the last several months it has caused some pain. He has been working on Ecologist and getting in certain positions causes discomfort. He has had bilateral inguinal hernias done in the early 1980s with no residual problems. He has no history of peptic ulcer disease liver disease or colon disease. He had a prior colonoscopy by [facility] about 4 years ago.  Past Medical History: 1. DM - 3 to 4 years - pills only I had him scheduled for surgery in February 2018. But his pre op labs showed a HgbA1c - 11.8 and a Glucose of 313.  He is doing much better with his HgbA1C near 6. 2. Macular degeneration 3. Obstructive sleep apnea, on CPAP  He sees Dr. Ashok Norris for this. 4. HTN 5. Minimal CAD - followed by Dr. Ashok Norris  Had cath about Aug 22, 2014 - which was negative 6. Open appendectomy - 1960's, Bilateral inguinal hernias in the 1970's - the left side required a redo with  mesh.  Social History: He has remarried - Aug 2018 - Carl Knapp. (I took care of his wife, Owin Vignola (a [employer] ICU nurse), who died of breast cancer in 08-22-2014) 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 His daughter works for [employer] in [place], [place]. She is familiar with the hernia products.   Allergies (April Staton, CMA; 05/17/2017 1:47 PM) No Known Drug Allergies [05/12/2015]:  Medication History (April Staton, CMA; 05/17/2017 1:49 PM) AmLODIPine Besylate (10MG  Tablet, Oral) Active. Aspirin (81MG  Tablet Chewable, Oral) Active. [place]-Con M20 [facility] Tablet ER, Oral) Active. Lisinopril-Hydrochlorothiazide (20-12.5MG  Tablet, Oral) Active. MetFORMIN HCl (1000MG  Tablet, Oral) Active. Metoprolol Succinate ER (25MG  Tablet ER 24HR, Oral) Active. Multiple Vitamin (Oral) Active. Nitrostat (0.4MG  Tab Sublingual, Sublingual) Active. Simvastatin (20MG  Tablet, Oral) Active. Walker (1 (one) as needed, Taken starting 05/21/2015) Active. Medications Reconciled  Vitals (April Staton CMA; 05/17/2017 1:49 PM) 05/17/2017 1:49 PM Weight: 220.25 lb Height: 70in Body Surface Area: 2.17 m Body Mass Index: 31.6 kg/m  Temp.: 97.66F(Oral)  Pulse: 53 (Regular)  BP: 148/76 (Sitting, Left Arm, Standard)   Physical Exam  General: WN older WM alert and generally healthy appearing. He looks a little thinner than when I last saw him. HEENT: Normal. Pupils equal.  Neck: Supple. No mass. No thyroid mass.  Lymph Nodes: No supraclavicular or cervical nodes.  Lungs: Clear to auscultation and symmetric breath sounds. Heart: RRR. No murmur or rub.  Abdomen: Soft. No tenderness. Normal bowel sounds. Right LQ  scar from open appendectomy and bilateral inguinal scars from inguinal hernias. Has 5 -6 cm bulge at umbilicus with about 3 cm fascial defect. Has diastasis recti.  Extremities: Good strength and ROM in upper and lower  extremities.  Neurologic: Grossly intact to motor and sensory function. Psychiatric: Has normal mood and affect. Behavior is normal.   Assessment & Plan  1.  UMBILICAL HERNIA WITHOUT OBSTRUCTION OR GANGRENE (K42.9) Impression: Discussed surgery Current Plans Pt Education - Pamphlet Given - Hernia Surgery: discussed with patient and provided information. Schedule for Surgery 2.  OBSTRUCTIVE SLEEP APNEA, ADULT (G47.33)  He sees Dr. Ashok Norris for this.  3. DM - 3 to 4 years - pills only I had him scheduled for surgery in February 2018. But his pre op labs showed a HgbA1c - 11.8 and a Glucose of 313.  He is doing much better with his HgbA1C near 6. 4. Macular degeneration 5. HTN 6. Minimal CAD - followed by Dr. Ashok Norris  Had cath about 2016 - which was negative 7. Open appendectomy - 1960's, Bilateral inguinal hernias in the 1970's - the left side required a redo with mesh.   Alphonsa Overall, MD, [facility] Surgery Pager: [phone] Office phone:  [phone]

## 2017-06-14 ENCOUNTER — Encounter (HOSPITAL_COMMUNITY): Payer: Self-pay

## 2017-06-14 ENCOUNTER — Ambulatory Visit (HOSPITAL_COMMUNITY): Payer: Medicare Other | Admitting: Certified Registered Nurse Anesthetist

## 2017-06-14 ENCOUNTER — Ambulatory Visit (HOSPITAL_COMMUNITY)
Admission: RE | Admit: 2017-06-14 | Discharge: 2017-06-14 | Disposition: A | Discharge status: Home / Self Care | Payer: Medicare Other | Source: Ambulatory Visit | Attending: Surgery | Admitting: Surgery

## 2017-06-14 ENCOUNTER — Encounter (HOSPITAL_COMMUNITY)
Admission: RE | Disposition: A | Discharge status: Home / Self Care | Payer: Self-pay | Source: Ambulatory Visit | Attending: Surgery

## 2017-06-14 DIAGNOSIS — K420 Umbilical hernia with obstruction, without gangrene: Secondary | ICD-10-CM | POA: Diagnosis not present

## 2017-06-14 DIAGNOSIS — I10 Essential (primary) hypertension: Secondary | ICD-10-CM | POA: Insufficient documentation

## 2017-06-14 DIAGNOSIS — Z87891 Personal history of nicotine dependence: Secondary | ICD-10-CM | POA: Insufficient documentation

## 2017-06-14 DIAGNOSIS — E119 Type 2 diabetes mellitus without complications: Secondary | ICD-10-CM | POA: Diagnosis not present

## 2017-06-14 DIAGNOSIS — Z9049 Acquired absence of other specified parts of digestive tract: Secondary | ICD-10-CM | POA: Diagnosis not present

## 2017-06-14 DIAGNOSIS — H3530 Unspecified macular degeneration: Secondary | ICD-10-CM | POA: Insufficient documentation

## 2017-06-14 DIAGNOSIS — I2510 Atherosclerotic heart disease of native coronary artery without angina pectoris: Secondary | ICD-10-CM | POA: Insufficient documentation

## 2017-06-14 DIAGNOSIS — E785 Hyperlipidemia, unspecified: Secondary | ICD-10-CM | POA: Diagnosis not present

## 2017-06-14 DIAGNOSIS — Z79899 Other long term (current) drug therapy: Secondary | ICD-10-CM | POA: Diagnosis not present

## 2017-06-14 DIAGNOSIS — G4733 Obstructive sleep apnea (adult) (pediatric): Secondary | ICD-10-CM | POA: Diagnosis not present

## 2017-06-14 HISTORY — PX: INSERTION OF MESH: SHX5868

## 2017-06-14 HISTORY — PX: UMBILICAL HERNIA REPAIR: SHX196

## 2017-06-14 LAB — GLUCOSE, CAPILLARY
GLUCOSE-CAPILLARY: 166 mg/dL — AB (ref 65–99)
GLUCOSE-CAPILLARY: 186 mg/dL — AB (ref 65–99)
Glucose-Capillary: 209 mg/dL — ABNORMAL HIGH (ref 65–99)

## 2017-06-14 SURGERY — REPAIR, HERNIA, UMBILICAL, LAPAROSCOPIC
Anesthesia: General | Site: Abdomen

## 2017-06-14 MED ORDER — PROPOFOL 10 MG/ML IV BOLUS
INTRAVENOUS | Status: AC
  Filled 2017-06-14: qty 20

## 2017-06-14 MED ORDER — ROCURONIUM BROMIDE 50 MG/5ML IV SOSY
PREFILLED_SYRINGE | INTRAVENOUS | Status: DC | PRN
  Administered 2017-06-14: 50 mg via INTRAVENOUS

## 2017-06-14 MED ORDER — SUGAMMADEX SODIUM 500 MG/5ML IV SOLN
INTRAVENOUS | Status: DC | PRN
  Administered 2017-06-14: 377.2 mg via INTRAVENOUS

## 2017-06-14 MED ORDER — CHLORHEXIDINE GLUCONATE CLOTH 2 % EX PADS: 6.0000 | MEDICATED_PAD | Freq: Once | CUTANEOUS | Status: DC

## 2017-06-14 MED ORDER — OXYCODONE HCL 5 MG PO TABS: 5.0000 mg | ORAL_TABLET | Freq: Once | ORAL | Status: DC | PRN

## 2017-06-14 MED ORDER — BUPIVACAINE LIPOSOME 1.3 % IJ SUSP
20.0000 mL | Freq: Once | INTRAMUSCULAR | Status: DC
  Filled 2017-06-14: qty 20

## 2017-06-14 MED ORDER — METOCLOPRAMIDE HCL 5 MG/ML IJ SOLN
INTRAMUSCULAR | Status: DC | PRN
  Administered 2017-06-14: 10 mg via INTRAVENOUS

## 2017-06-14 MED ORDER — MIDAZOLAM HCL 2 MG/2ML IJ SOLN
INTRAMUSCULAR | Status: AC
  Filled 2017-06-14: qty 2

## 2017-06-14 MED ORDER — DEXAMETHASONE SODIUM PHOSPHATE 4 MG/ML IJ SOLN
INTRAMUSCULAR | Status: DC | PRN
  Administered 2017-06-14: 4 mg via INTRAVENOUS

## 2017-06-14 MED ORDER — ONDANSETRON HCL 4 MG/2ML IJ SOLN: 4.0000 mg | Freq: Four times a day (QID) | INTRAMUSCULAR | Status: DC | PRN

## 2017-06-14 MED ORDER — SODIUM CHLORIDE 0.9 % IR SOLN
Status: DC | PRN
  Administered 2017-06-14: 1000 mL

## 2017-06-14 MED ORDER — SUGAMMADEX SODIUM 500 MG/5ML IV SOLN
INTRAVENOUS | Status: AC
  Filled 2017-06-14: qty 5

## 2017-06-14 MED ORDER — PROPOFOL 10 MG/ML IV BOLUS
INTRAVENOUS | Status: DC | PRN
  Administered 2017-06-14: 180 mg via INTRAVENOUS

## 2017-06-14 MED ORDER — ONDANSETRON HCL 4 MG/2ML IJ SOLN
INTRAMUSCULAR | Status: DC | PRN
  Administered 2017-06-14: 4 mg via INTRAVENOUS

## 2017-06-14 MED ORDER — FENTANYL CITRATE (PF) 100 MCG/2ML IJ SOLN
INTRAMUSCULAR | Status: DC | PRN
  Administered 2017-06-14: 25 ug via INTRAVENOUS
  Administered 2017-06-14 (×5): 50 ug via INTRAVENOUS
  Administered 2017-06-14: 25 ug via INTRAVENOUS

## 2017-06-14 MED ORDER — EPHEDRINE SULFATE-NACL 50-0.9 MG/10ML-% IV SOSY
PREFILLED_SYRINGE | INTRAVENOUS | Status: DC | PRN
  Administered 2017-06-14: 10 mg via INTRAVENOUS

## 2017-06-14 MED ORDER — LIDOCAINE 2% (20 MG/ML) 5 ML SYRINGE
INTRAMUSCULAR | Status: DC | PRN
  Administered 2017-06-14 (×2): 50 mg via INTRAVENOUS

## 2017-06-14 MED ORDER — OXYCODONE HCL 5 MG/5ML PO SOLN
5.0000 mg | Freq: Once | ORAL | Status: DC | PRN
  Filled 2017-06-14: qty 5

## 2017-06-14 MED ORDER — HYDROCODONE-ACETAMINOPHEN 5-325 MG PO TABS: 1.0000 | ORAL_TABLET | Freq: Four times a day (QID) | ORAL | 0 refills | Status: DC | PRN

## 2017-06-14 MED ORDER — FENTANYL CITRATE (PF) 100 MCG/2ML IJ SOLN
25.0000 ug | INTRAMUSCULAR | Status: DC | PRN
  Administered 2017-06-14: 50 ug via INTRAVENOUS

## 2017-06-14 MED ORDER — FENTANYL CITRATE (PF) 100 MCG/2ML IJ SOLN
INTRAMUSCULAR | Status: AC
  Filled 2017-06-14: qty 2

## 2017-06-14 MED ORDER — BUPIVACAINE-EPINEPHRINE 0.25% -1:200000 IJ SOLN
INTRAMUSCULAR | Status: AC
  Filled 2017-06-14: qty 1

## 2017-06-14 MED ORDER — BUPIVACAINE-EPINEPHRINE 0.25% -1:200000 IJ SOLN
INTRAMUSCULAR | Status: DC | PRN
  Administered 2017-06-14: 24 mL

## 2017-06-14 MED ORDER — CEFAZOLIN SODIUM-DEXTROSE 2-4 GM/100ML-% IV SOLN
2.0000 g | INTRAVENOUS | Status: AC
  Administered 2017-06-14: 2 g via INTRAVENOUS
  Filled 2017-06-14: qty 100

## 2017-06-14 MED ORDER — MIDAZOLAM HCL 5 MG/5ML IJ SOLN
INTRAMUSCULAR | Status: DC | PRN
  Administered 2017-06-14 (×2): 1 mg via INTRAVENOUS

## 2017-06-14 MED ORDER — LACTATED RINGERS IV SOLN
INTRAVENOUS | Status: DC | PRN
  Administered 2017-06-14 (×2): via INTRAVENOUS

## 2017-06-14 SURGICAL SUPPLY — 47 items
ADH SKN CLS APL DERMABOND .7 (GAUZE/BANDAGES/DRESSINGS)
APPLIER CLIP 5 13 M/L LIGAMAX5 (MISCELLANEOUS)
APPLIER CLIP ROT 10 11.4 M/L (STAPLE)
APR CLP MED LRG 11.4X10 (STAPLE)
APR CLP MED LRG 5 ANG JAW (MISCELLANEOUS)
BINDER ABDOMINAL 12 ML 46-62 (SOFTGOODS) ×2 IMPLANT
CABLE HIGH FREQUENCY MONO STRZ (ELECTRODE) ×3 IMPLANT
CHLORAPREP W/TINT 26ML (MISCELLANEOUS) ×3 IMPLANT
CLIP APPLIE 5 13 M/L LIGAMAX5 (MISCELLANEOUS) IMPLANT
CLIP APPLIE ROT 10 11.4 M/L (STAPLE) IMPLANT
CLOSURE WOUND 1/2 X4 (GAUZE/BANDAGES/DRESSINGS)
COVER SURGICAL LIGHT HANDLE (MISCELLANEOUS) ×3 IMPLANT
DECANTER SPIKE VIAL GLASS SM (MISCELLANEOUS) ×3 IMPLANT
DERMABOND ADVANCED (GAUZE/BANDAGES/DRESSINGS)
DERMABOND ADVANCED .7 DNX12 (GAUZE/BANDAGES/DRESSINGS) IMPLANT
DEVICE SECURE STRAP 25 ABSORB (INSTRUMENTS) IMPLANT
DEVICE TROCAR PUNCTURE CLOSURE (ENDOMECHANICALS) ×5 IMPLANT
DRAPE INCISE IOBAN 66X45 STRL (DRAPES) ×3 IMPLANT
ELECT REM PT RETURN 15FT ADLT (MISCELLANEOUS) ×3 IMPLANT
GLOVE SURG SIGNA 7.5 PF LTX (GLOVE) ×3 IMPLANT
GOWN STRL REUS W/TWL XL LVL3 (GOWN DISPOSABLE) ×9 IMPLANT
IRRIG SUCT STRYKERFLOW 2 WTIP (MISCELLANEOUS)
IRRIGATION SUCT STRKRFLW 2 WTP (MISCELLANEOUS) IMPLANT
KIT BASIN OR (CUSTOM PROCEDURE TRAY) ×3 IMPLANT
MARKER SKIN DUAL TIP RULER LAB (MISCELLANEOUS) ×2 IMPLANT
MESH PARIETEX 4.7 (Mesh General) ×2 IMPLANT
NDL SPNL 22GX3.5 QUINCKE BK (NEEDLE) ×1 IMPLANT
NEEDLE SPNL 22GX3.5 QUINCKE BK (NEEDLE) ×3 IMPLANT
PAD POSITIONING PINK XL (MISCELLANEOUS) IMPLANT
POSITIONER SURGICAL ARM (MISCELLANEOUS) IMPLANT
SCISSORS METZENBAUM CVD 33 (INSTRUMENTS) ×3 IMPLANT
SHEARS HARMONIC ACE PLUS 36CM (ENDOMECHANICALS) IMPLANT
SLEEVE XCEL OPT CAN 5 100 (ENDOMECHANICALS) ×3 IMPLANT
STRIP CLOSURE SKIN 1/2X4 (GAUZE/BANDAGES/DRESSINGS) IMPLANT
SUT MNCRL AB 4-0 PS2 18 (SUTURE) ×3 IMPLANT
SUT NOVA NAB GS-21 0 18 T12 DT (SUTURE) ×3 IMPLANT
SUT NOVA T20/GS 25 (SUTURE) ×2 IMPLANT
SUT VIC AB 2-0 SH 27 (SUTURE) ×3
SUT VIC AB 2-0 SH 27X BRD (SUTURE) IMPLANT
TAPE CLOTH 4X10 WHT NS (GAUZE/BANDAGES/DRESSINGS) IMPLANT
TOWEL OR 17X26 10 PK STRL BLUE (TOWEL DISPOSABLE) ×3 IMPLANT
TOWEL OR NON WOVEN STRL DISP B (DISPOSABLE) ×3 IMPLANT
TRAY FOLEY W/METER SILVER 16FR (SET/KITS/TRAYS/PACK) IMPLANT
TRAY LAPAROSCOPIC (CUSTOM PROCEDURE TRAY) ×3 IMPLANT
TROCAR BLADELESS OPT 5 100 (ENDOMECHANICALS) ×3 IMPLANT
TROCAR XCEL NON-BLD 11X100MML (ENDOMECHANICALS) ×3 IMPLANT
TUBING INSUF HEATED (TUBING) IMPLANT

## 2017-06-14 NOTE — Anesthesia Procedure Notes (Signed)
Procedure Name: Intubation Date/Time: 06/14/2017 7:40 AM Performed by: Deliah Boston, CRNA Pre-anesthesia Checklist: Patient identified, Emergency Drugs available, Suction available and Patient being monitored Patient Re-evaluated:Patient Re-evaluated prior to induction Oxygen Delivery Method: Circle system utilized Preoxygenation: Pre-oxygenation with 100% oxygen Induction Type: IV induction Ventilation: Mask ventilation without difficulty Laryngoscope Size: Mac and 3 Grade View: Grade III Tube type: Oral Tube size: 7.5 mm Number of attempts: 2 Airway Equipment and Method: Stylet and Oral airway Placement Confirmation: ETT inserted through vocal cords under direct vision,  positive ETCO2 and breath sounds checked- equal and bilateral Secured at: 23 cm Tube secured with: Tape Dental Injury: Teeth and Oropharynx as per pre-operative assessment  Difficulty Due To: Difficulty was anticipated, Difficult Airway- due to reduced neck mobility, Difficult Airway- due to anterior larynx, Difficult Airway- due to limited oral opening and Difficult Airway- due to dentition Comments: Easy mask, DVL by CRNA with mac 4, no view, DVL by MDA with mac 3 grade 3 view.

## 2017-06-14 NOTE — Interval H&P Note (Signed)
History and Physical Interval Note:  06/14/2017 7:22 AM  Carl Knapp  has presented today for surgery, with the diagnosis of umbilical hernia  The various methods of treatment have been discussed with the patient and family.   His wife is with him.  After consideration of risks, benefits and other options for treatment, the patient has consented to  Procedure(s) with comments: [place] (N/A) - [place] (N/A) as a surgical intervention .  The patient's history has been reviewed, patient examined, no change in status, stable for surgery.  I have reviewed the patient's chart and labs.  Questions were answered to the patient's satisfaction.     Shann Medal

## 2017-06-14 NOTE — Op Note (Addendum)
OPERATIVE NOTE  06/14/2017  8:43 AM  PATIENT:  Carl Knapp, 72 y.o., male, MRN: [id number]  PREOP DIAGNOSIS:  umbilical hernia  POSTOP DIAGNOSIS:   Incarcerated umbilical hernia  PROCEDURE:   Procedure(s): LAPAROSCOPIC UMBILICAL HERNIA REPAIR WITH MESH  [photos at end of note - but not all photos made Epic?]  SURGEON:   Alphonsa Overall, M.D.  ASSISTANT:   None  ANESTHESIA:   general  Anesthesiologist: Albertha Ghee, MD CRNA: Deliah Boston, CRNA  General  EBL:  miniam  ml  BLOOD ADMINISTERED: none  DRAINS: none   LOCAL MEDICATIONS USED:   25 cc of 1/4% marcaine  SPECIMEN:   none  COUNTS CORRECT:  YES  INDICATIONS FOR PROCEDURE:  Carl Knapp is a 72 y.o. (DOB: [birth year]) white male whose primary care physician is Maury Dus, MD and comes for laparoscopic repair of umbilical hernia.   The indications and risks of the surgery were explained to the patient.  The risks include, but are not limited to, infection, bleeding, and nerve injury.  OPERATIVE NOTE:  The patient was taken to room 2 at [facility].  He underwent a general anesthesia.  He was given 2 grams of Ancef at the beginning of the operation.   A time out was held and the surgical checklist run.   The abdomen was prepped with Cholroprep and sterilely draped.  I covered the abdomen with a Ioban drape.   I accessed the LUQ with a 5 mm trocar.  I later upsized this trocar to a 10 mm Ethicon trocar.  An additional 5 mm trocar was placed in the left mid abdomen.   He had a 4 cm defect at the umbilicus.  There was some omentum incarcerated in the defect.  I reduced the omentum   I closed the fascial defect with figure of 8 #1 Novafil.   Then I placed a 12 cm round Parietex mesh in the abdomen.  It had 4 holding sutures of 0 Novafil.  These were tied down to secure the mesh to the anterior abdominal wall.  I then used 25 Securestrap tacks to tack the mesh to the anterior abdominal wall.   The abdomen was deflated.   The mesh was inspected and there were no defects around the edge.   The trocars were then removed.  There was no bleeding at the trocar sites.  The incisions were closed with 4-0 Monocryl and painted with DermaBond. The puncture sites were painted with DermaBond.   The sponge and needle count were correct.  The patient had an abdominal binder placed.  The patient was transferred to the recovery room in good condition.   Type of repair - mesh  (choices - primary suture, mesh, or component)  Name of mesh - Parietex  Size of mesh - Length 12 cm, Width 12 cm  (12 cm round mesh)  Mesh overlap - 5 cm  Placement of mesh - beneath fascia and into peritoneal cavity  (choices - beneath fascia and into peritoneal cavity, beneath fascia but external to peritoneal cavity, between the muscle and fascia, above or external to fascia)   Incarcerated hernia, fascial defect   After suture closure, 12 cm Parietex (view from LUQ to RLQ)   Alphonsa Overall, MD, [facility] Pager: [phone] Office phone:  [phone]

## 2017-06-14 NOTE — Progress Notes (Signed)
Pt's daughter here now, pt ready to go home; Discharged via w/c

## 2017-06-14 NOTE — Transfer of Care (Signed)
Immediate Anesthesia Transfer of Care Note  Patient: Jess Toney [facility]  Procedure(s) Performed: Procedure(s) with comments: [place] (N/A) - ERAS PATHWAY INSERTION OF MESH (N/A)  Patient Location: PACU  Anesthesia Type:General  Level of Consciousness: Patient easily awoken, sedated, comfortable, cooperative, following commands, responds to stimulation.   Airway & Oxygen Therapy: Patient spontaneously breathing, ventilating well, oxygen via simple oxygen mask.  Post-op Assessment: Report given to PACU RN, vital signs reviewed and stable, moving all extremities.   Post vital signs: Reviewed and stable.  Complications: No apparent anesthesia complications Last Vitals:  Vitals:   06/14/17 0600  BP: (!) 146/63  Pulse: 62  Resp: 18  Temp: 36.8 C  SpO2: 97%    Last Pain:  Vitals:   06/14/17 0600  TempSrc: Oral         Complications: No apparent anesthesia complications

## 2017-06-14 NOTE — Progress Notes (Signed)
PACU note; pt tolerated water, coffee, and crackers well, no nausea; up to BR, voided QS, tolerated well; IV discontinued; pt's wife and pt voice understanding of discharge instructions; pt dressed and tolerated well; pt does state he is dizzy and sleepy when ambulating; pt reassured this is within normal limits for general anesthesia

## 2017-06-14 NOTE — Progress Notes (Signed)
PACU Phase II Nursing Note: Pt sitting in chair, dressed, wife at side. Tolerating PO fluids. Pt ready for dc to home. Awaiting daughters return from pharmacy. Daughter providing transportation home. Comfort measures provided.

## 2017-06-14 NOTE — Anesthesia Preprocedure Evaluation (Signed)
Anesthesia Evaluation  Patient identified by MRN, date of birth, ID band Patient awake    Reviewed: Allergy & Precautions, H&P , NPO status , Patient's Chart, lab work & pertinent test results  Airway Mallampati: II   Neck ROM: full    Dental   Pulmonary sleep apnea , former smoker,    breath sounds clear to auscultation       Cardiovascular hypertension, + CAD and + DVT  + dysrhythmias  Rhythm:regular Rate:Normal  PVCs.  Non-obstructive CAD by cath 2015.  Medical management by cardiologist, Fransico Him MD   Neuro/Psych    GI/Hepatic   Endo/Other  diabetes, Type 2  Renal/GU      Musculoskeletal   Abdominal   Peds  Hematology   Anesthesia Other Findings   Reproductive/Obstetrics                             Anesthesia Physical Anesthesia Plan  ASA: III  Anesthesia Plan: General   Post-op Pain Management:    Induction: Intravenous  PONV Risk Score and Plan: 2 and Ondansetron and Treatment may vary due to age or medical condition  Airway Management Planned: Oral ETT  Additional Equipment:   Intra-op Plan:   Post-operative Plan: Extubation in OR  Informed Consent: I have reviewed the patients History and Physical, chart, labs and discussed the procedure including the risks, benefits and alternatives for the proposed anesthesia with the patient or authorized representative who has indicated his/her understanding and acceptance.     Plan Discussed with: CRNA, Anesthesiologist and Surgeon  Anesthesia Plan Comments:         Anesthesia Quick Evaluation

## 2017-06-14 NOTE — Discharge Instructions (Signed)
[facility] - DISCHARGE INSTRUCTIONS TO PATIENT  Activity:  Driving - May drive in 2 to 4 days, if doing well   Lifting - No lifting more than 15 pounds for 4 weeks  Wound Care:   Leave dressing on for 2 days, then may remove and shower  Diet:  As tolerate.  Eat light for a couple of days  Follow up appointment:  Call Dr. Pollie Friar office [facility]) at [phone] for an appointment in 2 to 3 weeks.  Medications and dosages:  Resume your home medications.  You have a prescription for:  vicodin  Call Dr. Lucia Gaskins or his office  [phone]) if you have:  Temperature greater than 100.4,  Persistent nausea and vomiting,  Severe uncontrolled pain,  Redness, tenderness, or signs of infection (pain, swelling, redness, odor or green/yellow discharge around the site),  Difficulty breathing, headache or visual disturbances,  Any other questions or concerns you may have after discharge.  In an emergency, call 911 or go to an Emergency Department at a nearby hospital.

## 2017-06-15 ENCOUNTER — Encounter (HOSPITAL_COMMUNITY): Payer: Self-pay | Admitting: Surgery

## 2017-06-15 NOTE — Anesthesia Postprocedure Evaluation (Signed)
Anesthesia Post Note  Patient: Carl Knapp  Procedure(s) Performed: LAPAROSCOPIC UMBILICAL HERNIA REPAIR WITH MESH ERAS PATHWAY (N/A Abdomen) INSERTION OF MESH (N/A Abdomen)     Patient location during evaluation: PACU Anesthesia Type: General Level of consciousness: awake and alert Pain management: pain level controlled Vital Signs Assessment: post-procedure vital signs reviewed and stable Respiratory status: spontaneous breathing, nonlabored ventilation, respiratory function stable and patient connected to nasal cannula oxygen Cardiovascular status: blood pressure returned to baseline and stable Postop Assessment: no apparent nausea or vomiting Anesthetic complications: no    Last Vitals:  Vitals:   06/14/17 0958 06/14/17 1045  BP: (!) 150/76 (!) 150/67  Pulse: 78 79  Resp: 15 15  Temp: (!) 36.4 C (!) 36.4 C  SpO2: 96% 94%    Last Pain:  Vitals:   06/14/17 1045  TempSrc:   PainSc: [place]

## 2017-06-28 DIAGNOSIS — N183 Chronic kidney disease, stage 3 (moderate): Secondary | ICD-10-CM | POA: Diagnosis not present

## 2017-06-28 DIAGNOSIS — Z6830 Body mass index (BMI) 30.0-30.9, adult: Secondary | ICD-10-CM | POA: Diagnosis not present

## 2017-06-28 DIAGNOSIS — R55 Syncope and collapse: Secondary | ICD-10-CM | POA: Diagnosis not present

## 2017-06-28 DIAGNOSIS — I10 Essential (primary) hypertension: Secondary | ICD-10-CM | POA: Diagnosis not present

## 2017-06-28 DIAGNOSIS — E7800 Pure hypercholesterolemia, unspecified: Secondary | ICD-10-CM | POA: Diagnosis not present

## 2017-06-28 DIAGNOSIS — E1129 Type 2 diabetes mellitus with other diabetic kidney complication: Secondary | ICD-10-CM | POA: Diagnosis not present

## 2017-06-28 DIAGNOSIS — I25119 Atherosclerotic heart disease of native coronary artery with unspecified angina pectoris: Secondary | ICD-10-CM | POA: Diagnosis not present

## 2017-06-28 DIAGNOSIS — B353 Tinea pedis: Secondary | ICD-10-CM | POA: Diagnosis not present

## 2017-06-28 DIAGNOSIS — Z7984 Long term (current) use of oral hypoglycemic drugs: Secondary | ICD-10-CM | POA: Diagnosis not present

## 2017-09-13 DIAGNOSIS — S8992XA Unspecified injury of left lower leg, initial encounter: Secondary | ICD-10-CM | POA: Diagnosis not present

## 2017-09-13 DIAGNOSIS — M1712 Unilateral primary osteoarthritis, left knee: Secondary | ICD-10-CM | POA: Diagnosis not present

## 2017-10-02 DIAGNOSIS — Z7984 Long term (current) use of oral hypoglycemic drugs: Secondary | ICD-10-CM | POA: Diagnosis not present

## 2017-10-02 DIAGNOSIS — H35363 Drusen (degenerative) of macula, bilateral: Secondary | ICD-10-CM | POA: Diagnosis not present

## 2017-10-02 DIAGNOSIS — H524 Presbyopia: Secondary | ICD-10-CM | POA: Diagnosis not present

## 2017-10-02 DIAGNOSIS — H2513 Age-related nuclear cataract, bilateral: Secondary | ICD-10-CM | POA: Diagnosis not present

## 2017-10-02 DIAGNOSIS — H52223 Regular astigmatism, bilateral: Secondary | ICD-10-CM | POA: Diagnosis not present

## 2017-10-02 DIAGNOSIS — H5203 Hypermetropia, bilateral: Secondary | ICD-10-CM | POA: Diagnosis not present

## 2017-10-02 DIAGNOSIS — E119 Type 2 diabetes mellitus without complications: Secondary | ICD-10-CM | POA: Diagnosis not present

## 2017-10-02 DIAGNOSIS — H3540 Unspecified peripheral retinal degeneration: Secondary | ICD-10-CM | POA: Diagnosis not present

## 2017-11-03 DIAGNOSIS — S90822A Blister (nonthermal), left foot, initial encounter: Secondary | ICD-10-CM | POA: Diagnosis not present

## 2017-11-03 DIAGNOSIS — L089 Local infection of the skin and subcutaneous tissue, unspecified: Secondary | ICD-10-CM | POA: Diagnosis not present

## 2018-01-14 DIAGNOSIS — L309 Dermatitis, unspecified: Secondary | ICD-10-CM | POA: Diagnosis not present

## 2018-01-14 DIAGNOSIS — Z23 Encounter for immunization: Secondary | ICD-10-CM | POA: Diagnosis not present

## 2018-01-14 DIAGNOSIS — I25119 Atherosclerotic heart disease of native coronary artery with unspecified angina pectoris: Secondary | ICD-10-CM | POA: Diagnosis not present

## 2018-01-14 DIAGNOSIS — Z125 Encounter for screening for malignant neoplasm of prostate: Secondary | ICD-10-CM | POA: Diagnosis not present

## 2018-01-14 DIAGNOSIS — E1129 Type 2 diabetes mellitus with other diabetic kidney complication: Secondary | ICD-10-CM | POA: Diagnosis not present

## 2018-01-14 DIAGNOSIS — N529 Male erectile dysfunction, unspecified: Secondary | ICD-10-CM | POA: Diagnosis not present

## 2018-01-14 DIAGNOSIS — Z1389 Encounter for screening for other disorder: Secondary | ICD-10-CM | POA: Diagnosis not present

## 2018-01-14 DIAGNOSIS — Z0000 Encounter for general adult medical examination without abnormal findings: Secondary | ICD-10-CM | POA: Diagnosis not present

## 2018-01-14 DIAGNOSIS — Z6830 Body mass index (BMI) 30.0-30.9, adult: Secondary | ICD-10-CM | POA: Diagnosis not present

## 2018-01-14 DIAGNOSIS — I10 Essential (primary) hypertension: Secondary | ICD-10-CM | POA: Diagnosis not present

## 2018-01-14 DIAGNOSIS — E7800 Pure hypercholesterolemia, unspecified: Secondary | ICD-10-CM | POA: Diagnosis not present

## 2018-01-14 DIAGNOSIS — N183 Chronic kidney disease, stage 3 (moderate): Secondary | ICD-10-CM | POA: Diagnosis not present

## 2018-04-27 DIAGNOSIS — R067 Sneezing: Secondary | ICD-10-CM | POA: Diagnosis not present

## 2018-04-27 DIAGNOSIS — H04209 Unspecified epiphora, unspecified lacrimal gland: Secondary | ICD-10-CM | POA: Diagnosis not present

## 2018-04-27 DIAGNOSIS — J029 Acute pharyngitis, unspecified: Secondary | ICD-10-CM | POA: Diagnosis not present

## 2018-04-27 DIAGNOSIS — R0981 Nasal congestion: Secondary | ICD-10-CM | POA: Diagnosis not present

## 2018-06-13 ENCOUNTER — Ambulatory Visit (INDEPENDENT_AMBULATORY_CARE_PROVIDER_SITE_OTHER): Payer: Medicare Other | Admitting: Cardiology

## 2018-06-13 ENCOUNTER — Encounter: Payer: Self-pay | Admitting: Cardiology

## 2018-06-13 VITALS — BP 132/64 | HR 62 | Ht 70.0 in | Wt 218.6 lb

## 2018-06-13 DIAGNOSIS — G4733 Obstructive sleep apnea (adult) (pediatric): Secondary | ICD-10-CM | POA: Diagnosis not present

## 2018-06-13 DIAGNOSIS — E669 Obesity, unspecified: Secondary | ICD-10-CM

## 2018-06-13 DIAGNOSIS — I10 Essential (primary) hypertension: Secondary | ICD-10-CM | POA: Diagnosis not present

## 2018-06-13 NOTE — Progress Notes (Signed)
Cardiology Office Note:    Date:  06/13/2018   ID:  Carl Knapp, DOB 21-Jul-1945, MRN [id number]  PCP:  Maury Dus, MD  Cardiologist:  Fransico Him, MD    Referring MD: Maury Dus, MD   No chief complaint on file.   History of Present Illness:    Carl Knapp is a 73 y.o. male with a hx of severe OSA with AHI 37/hr and now on CPAP at 10cm H2O. He is here today for followup and is doing well.  He denies any chest pain or pressure, SOB, DOE, PND, orthopnea, LE edema, dizziness, palpitations or syncope. He is compliant with his meds and is tolerating meds with no SE.    Past Medical History:  Diagnosis Date  . Coronary artery disease 12/2013   20-30% RCA by cath- states was good.  . Diabetes mellitus without complication ([place])    type II  . DVT (deep venous thrombosis) ([place])    bilateral legs-greater on left. -3-5 years ago-tx. coumadin x 1 year.  Marland Kitchen Hx of cardiovascular stress test    ETT-Myoview (7/15):  ECG with ST depression; freq PVCs, + chest pain; normal perfusion  . Hyperlipidemia    LDL goal<100  . Hypertension   . Macular degeneration    mild  . OSA (obstructive sleep apnea)    Severe w AHI 37/hr now on CPAP at 10cm H2O    Past Surgical History:  Procedure Laterality Date  . APPENDECTOMY     open '68  . HERNIA REPAIR Bilateral    inguinal hernia repair '80  , umbilical repair Dr. Lucia Gaskins 06-14-17  . INSERTION OF MESH N/A 06/14/2017   Procedure: INSERTION OF MESH;  Surgeon: Alphonsa Overall, MD;  Location: [facility];  Service: General;  Laterality: N/A;  . LEFT HEART CATHETERIZATION WITH CORONARY ANGIOGRAM N/A 12/09/2013   Procedure: LEFT HEART CATHETERIZATION WITH CORONARY ANGIOGRAM;  Surgeon: Jettie Booze, MD;  Location: [facility] CATH LAB;  Service: Cardiovascular;  Laterality: N/A;  . UMBILICAL HERNIA REPAIR N/A 06/14/2017   Procedure: LAPAROSCOPIC UMBILICAL HERNIA REPAIR WITH MESH ERAS PATHWAY;  Surgeon: Alphonsa Overall, MD;  Location: [facility];  Service: General;   Laterality: N/A;  ERAS PATHWAY  . VASECTOMY      Current Medications: No outpatient medications have been marked as taking for the 06/13/18 encounter (Appointment) with Sueanne Margarita, MD.     Allergies:   Patient has no known allergies.   Social History   Socioeconomic History  . Marital status: Married    Spouse name: Not on file  . Number of children: Not on file  . Years of education: Not on file  . Highest education level: Not on file  Occupational History  . Not on file  Social Needs  . Financial resource strain: Not on file  . Food insecurity:    Worry: Not on file    Inability: Not on file  . Transportation needs:    Medical: Not on file    Non-medical: Not on file  Tobacco Use  . Smoking status: Former Smoker    Types: Cigarettes    Last attempt to quit: 06/08/2010    Years since quitting: 8.0  . Smokeless tobacco: Never Used  Substance and Sexual Activity  . Alcohol use: Yes    Comment: rare beer or glass of wine  . Drug use: No  . Sexual activity: Yes  Lifestyle  . Physical activity:    Days per week: Not on file  Minutes per session: Not on file  . Stress: Not on file  Relationships  . Social connections:    Talks on phone: Not on file    Gets together: Not on file    Attends religious service: Not on file    Active member of club or organization: Not on file    Attends meetings of clubs or organizations: Not on file    Relationship status: Not on file  Other Topics Concern  . Not on file  Social History Narrative  . Not on file     Family History: The patient's family history includes AAA (abdominal aortic aneurysm) in his father; CAD in his father; Heart attack in his father; Hypertension in an other family member.  ROS:   Please see the history of present illness.    ROS  All other systems reviewed and negative.   EKGs/Labs/Other Studies Reviewed:    The following studies were reviewed today: PAP download  EKG:  EKG is not ordered  today.   Recent Labs: No results found for requested labs within last 8760 hours.   Recent Lipid Panel No results found for: CHOL, TRIG, HDL, CHOLHDL, VLDL, LDLCALC, LDLDIRECT  Physical Exam:    VS:  There were no vitals taken for this visit.    Wt Readings from Last 3 Encounters:  06/14/17 208 lb (94.3 kg)  06/11/17 208 lb (94.3 kg)  05/30/17 222 lb 8 oz (100.9 kg)     GEN:  Well nourished, well developed in no acute distress HEENT: Normal NECK: No JVD; No carotid bruits LYMPHATICS: No lymphadenopathy CARDIAC: RRR, no murmurs, rubs, gallops RESPIRATORY:  Clear to auscultation without rales, wheezing or rhonchi  ABDOMEN: Soft, non-tender, non-distended MUSCULOSKELETAL:  No edema; No deformity  SKIN: Warm and dry NEUROLOGIC:  Alert and oriented x 3 PSYCHIATRIC:  Normal affect   ASSESSMENT:    1. Obstructive sleep apnea   2. Essential hypertension, benign   3. Obesity (BMI 30-39.9)    PLAN:    In order of problems listed above:  1.  OSA - the patient is tolerating PAP therapy well without any problems. The PAP download was reviewed today and showed an AHI of 1.6/hr on 10.6 cm H2O with 88% compliance in using more than 4 hours nightly.  The patient has been using and benefiting from PAP use and will continue to benefit from therapy.   2.  HTN -BP is adequately controlled on exam today.  He will continue on amlodipine 10 mg daily and lisinopril-HCTZ 20-12.5 mg twice daily.  3.  Obesity - I have encouraged him to get into a routine exercise program and cut back on carbs and portions.    Medication Adjustments/Labs and Tests Ordered: Current medicines are reviewed at length with the patient today.  Concerns regarding medicines are outlined above.  No orders of the defined types were placed in this encounter.  No orders of the defined types were placed in this encounter.   Signed, Fransico Him, MD  06/13/2018 8:21 AM    [place]

## 2018-06-13 NOTE — Patient Instructions (Signed)
Medication Instructions:  Your physician recommends that you continue on your current medications as directed. Please refer to the Current Medication list given to you today.  If you need a refill on your cardiac medications before your next appointment, please call your pharmacy.   Lab work: None If you have labs (blood work) drawn today and your tests are completely normal, you will receive your results only by: . MyChart Message (if you have MyChart) OR . A paper copy in the mail If you have any lab test that is abnormal or we need to change your treatment, we will call you to review the results.  Testing/Procedures: None  Follow-Up: At [facility], you and your health needs are our priority.  As part of our continuing mission to provide you with exceptional heart care, we have created designated Provider Care Teams.  These Care Teams include your primary Cardiologist (physician) and Advanced Practice Providers (APPs -  Physician Assistants and Nurse Practitioners) who all work together to provide you with the care you need, when you need it. You will need a follow up appointment in 1 years.  Please call our office 2 months in advance to schedule this appointment.  You may see Traci Turner, MD or one of the following Advanced Practice Providers on your designated Care Team:   Brittainy Simmons, PA-C Dayna Dunn, PA-C . Michele Lenze, PA-C   

## 2018-06-14 DIAGNOSIS — G4733 Obstructive sleep apnea (adult) (pediatric): Secondary | ICD-10-CM

## 2018-06-17 DIAGNOSIS — N289 Disorder of kidney and ureter, unspecified: Secondary | ICD-10-CM | POA: Diagnosis not present

## 2018-06-17 DIAGNOSIS — B353 Tinea pedis: Secondary | ICD-10-CM | POA: Diagnosis not present

## 2018-06-17 DIAGNOSIS — N63 Unspecified lump in unspecified breast: Secondary | ICD-10-CM | POA: Diagnosis not present

## 2018-06-17 DIAGNOSIS — E1165 Type 2 diabetes mellitus with hyperglycemia: Secondary | ICD-10-CM | POA: Diagnosis not present

## 2018-06-18 ENCOUNTER — Other Ambulatory Visit: Payer: Self-pay | Admitting: Family Medicine

## 2018-06-18 DIAGNOSIS — N63 Unspecified lump in unspecified breast: Secondary | ICD-10-CM

## 2018-06-25 ENCOUNTER — Ambulatory Visit
Admission: RE | Admit: 2018-06-25 | Discharge: 2018-06-25 | Disposition: A | Discharge status: Home / Self Care | Payer: Medicare Other | Source: Ambulatory Visit | Attending: Family Medicine | Admitting: Family Medicine

## 2018-06-25 DIAGNOSIS — R928 Other abnormal and inconclusive findings on diagnostic imaging of breast: Secondary | ICD-10-CM | POA: Diagnosis not present

## 2018-06-25 DIAGNOSIS — N63 Unspecified lump in unspecified breast: Secondary | ICD-10-CM

## 2018-06-25 DIAGNOSIS — N644 Mastodynia: Secondary | ICD-10-CM | POA: Diagnosis not present

## 2018-06-25 IMAGING — US ULTRASOUND LEFT BREAST LIMITED
1 series · 2 of 2 positions shown · non-contrast
Comparison: None

CLINICAL DATA: Patient presents for focal tenderness within the
retroareolar left breast.

EXAM:
DIGITAL DIAGNOSTIC BILATERAL MAMMOGRAM WITH CAD AND TOMO
ULTRASOUND LEFT BREAST

[Series 1: ultrasound left breast limited · 0.06mm/px · 2 of 2 slices shown]
[im 1/2]
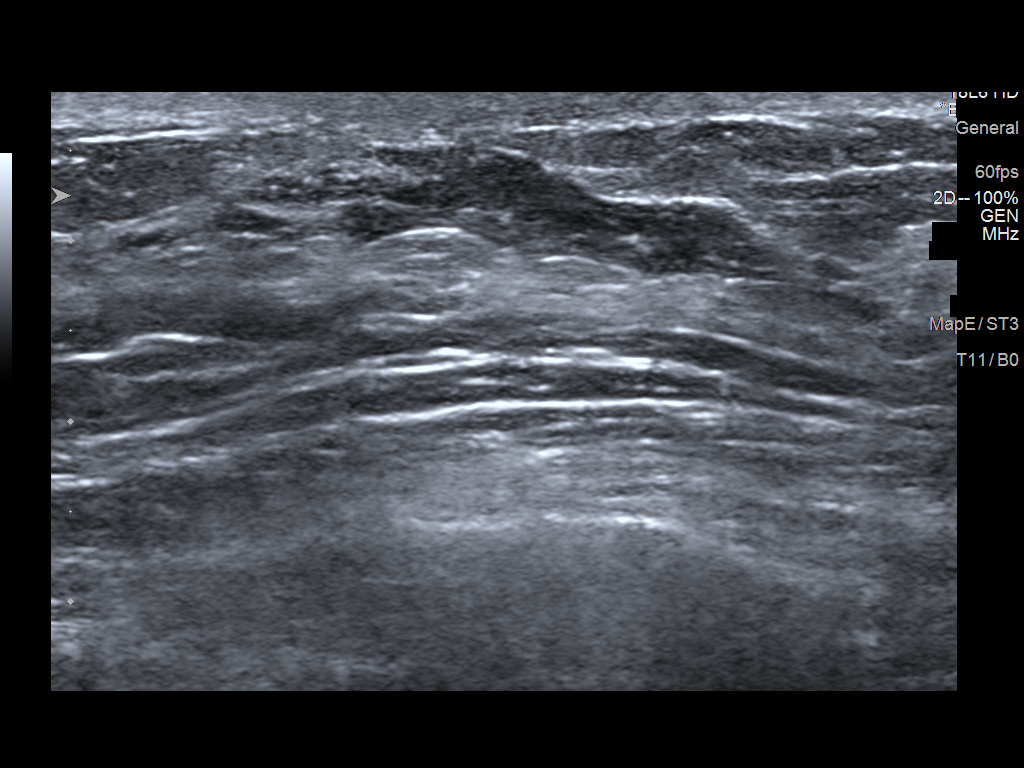
[im 2/2]
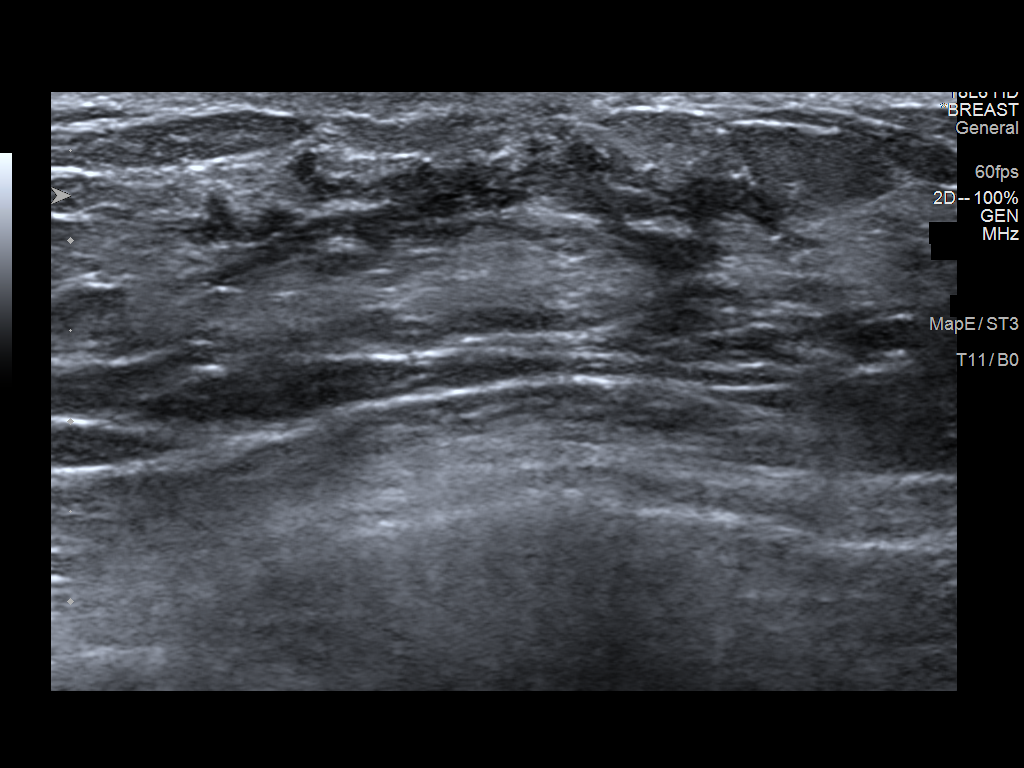

[2 of 2 positions shown; findings below may reference images not displayed]

ACR Breast Density Category b: There are scattered areas of
fibroglandular density.
FINDINGS: Asymmetric flame shaped density demonstrated within the retroareolar
left breast underlying the site of palpable concern. No concerning
masses, calcifications or distortion identified either breast.

Mammographic images were processed with CAD.

On physical exam, there is soft mobile mass palpated within the
retroareolar left breast.

Targeted ultrasound is performed, showing gynecomastia without
suspicious mass within the retroareolar left breast at the site of
palpable concern.
IMPRESSION: Asymmetric gynecomastia, left greater than right.

No mammographic evidence for malignancy.

RECOMMENDATION:
Continued clinical evaluation for left breast palpable abnormality.

I have discussed the findings and recommendations with the patient.
Results were also provided in writing at the conclusion of the
visit. If applicable, a reminder letter will be sent to the patient
regarding the next appointment.

BI-RADS CATEGORY  2: Benign.

## 2018-06-25 IMAGING — MG DIGITAL DIAGNOSTIC BILATERAL MAMMOGRAM WITH TOMO AND CAD
6 of 10 series · 6 of 30 positions shown · non-contrast
Comparison: None

CLINICAL DATA: Patient presents for focal tenderness within the
retroareolar left breast.

EXAM:
DIGITAL DIAGNOSTIC BILATERAL MAMMOGRAM WITH CAD AND TOMO
ULTRASOUND LEFT BREAST

[L MLO synth-2D]
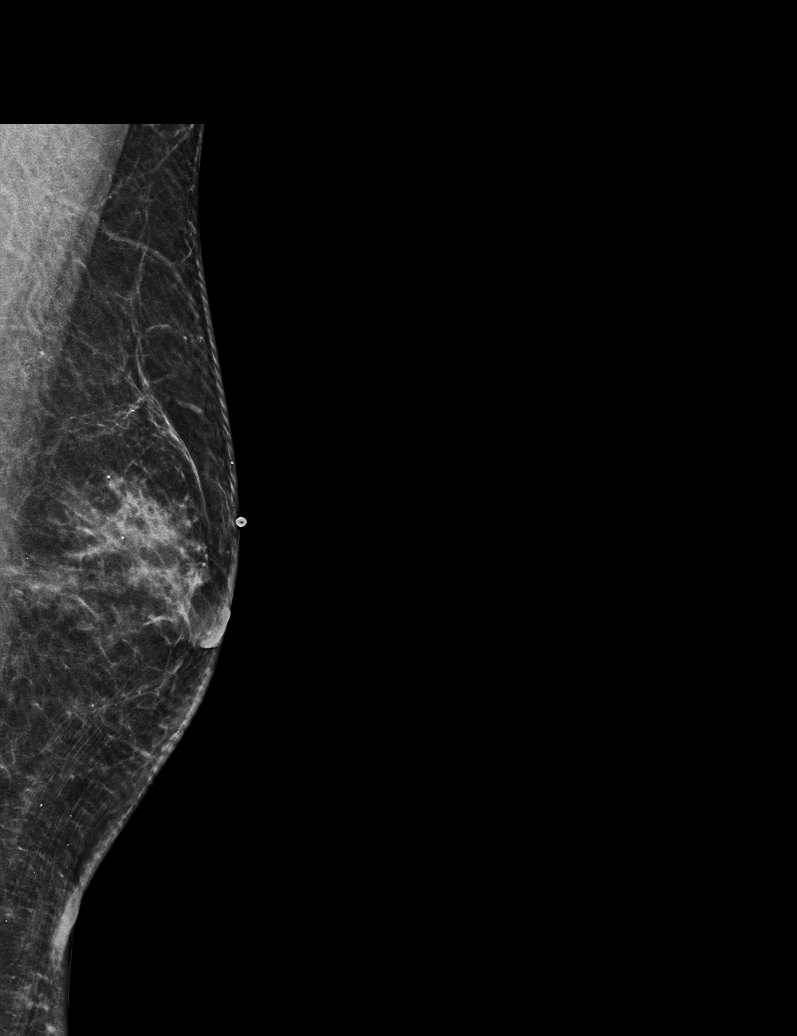

[R MLO synth-2D]
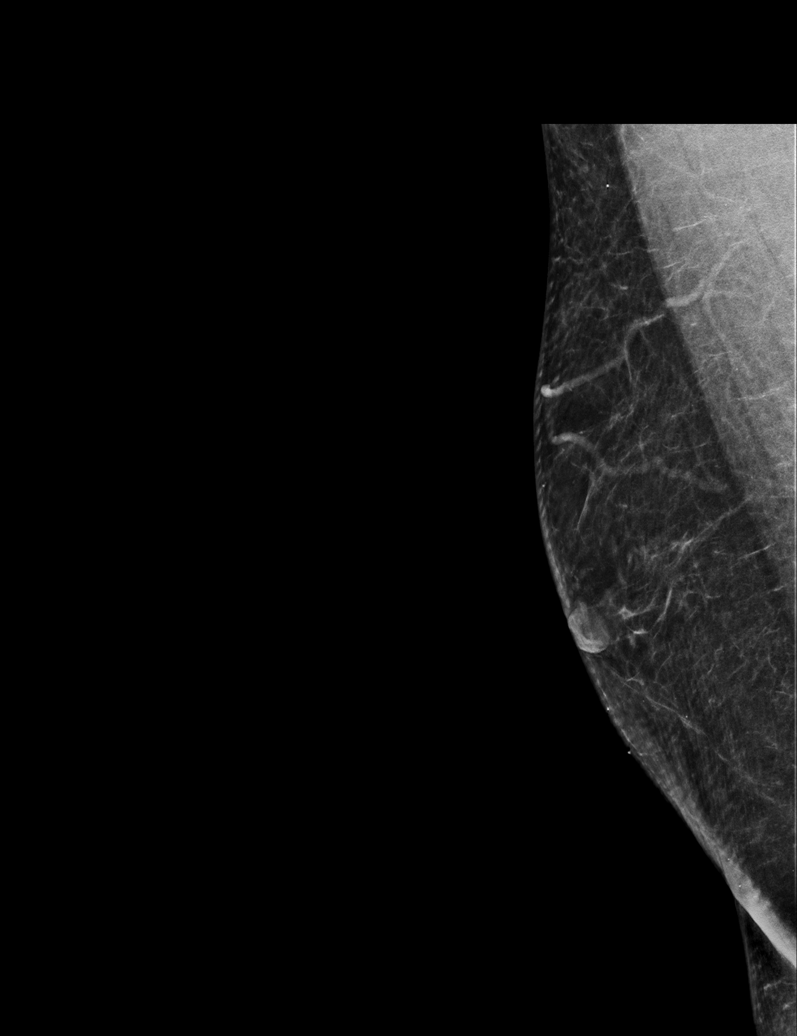

[L CC synth-2D (1 of 2)]
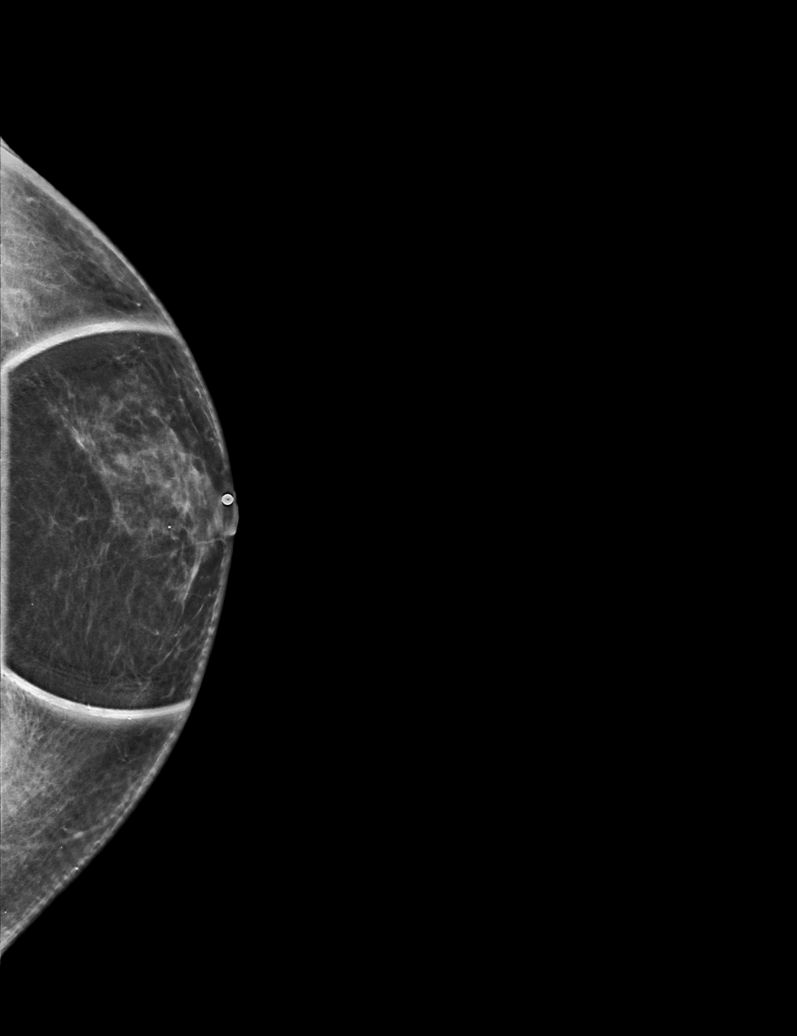

[R CC synth-2D]
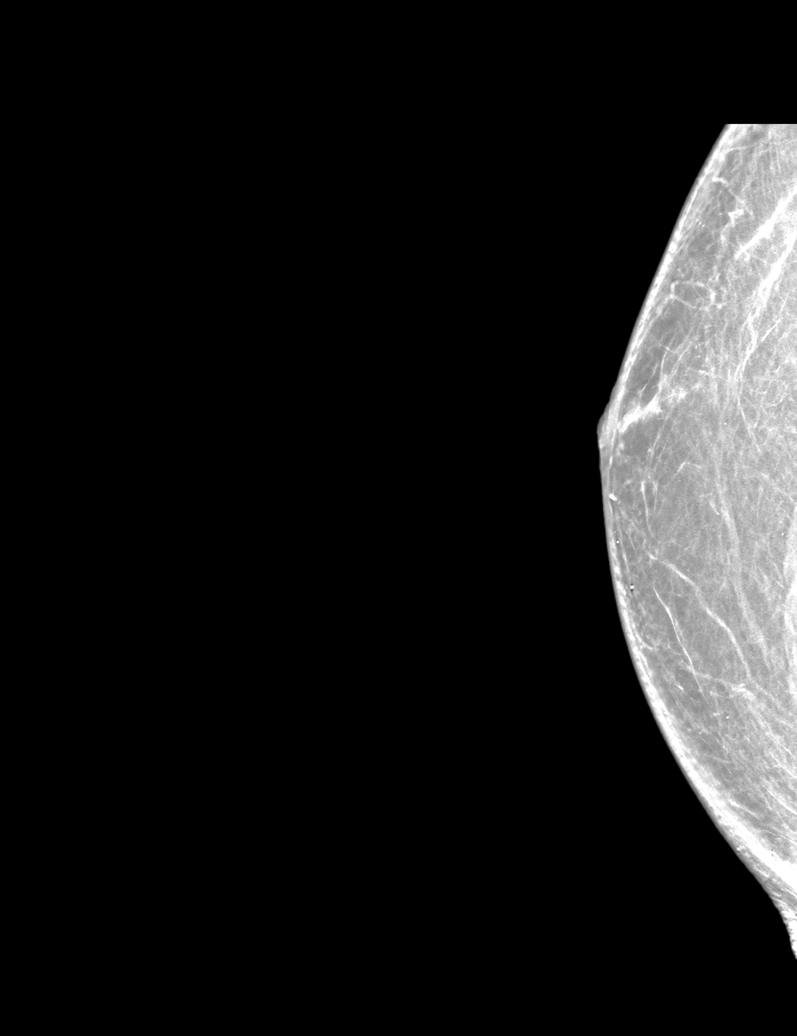

[L CC synth-2D (2 of 2)]
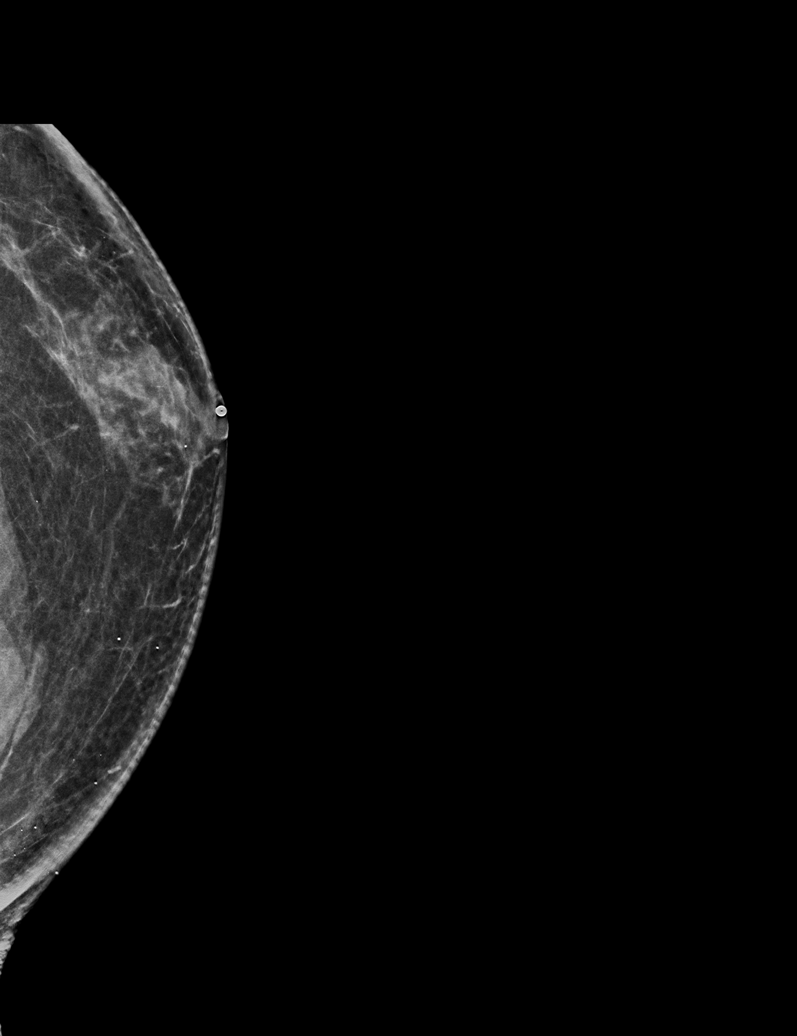

[L MLO tomo · tomo slice 30/59.0]
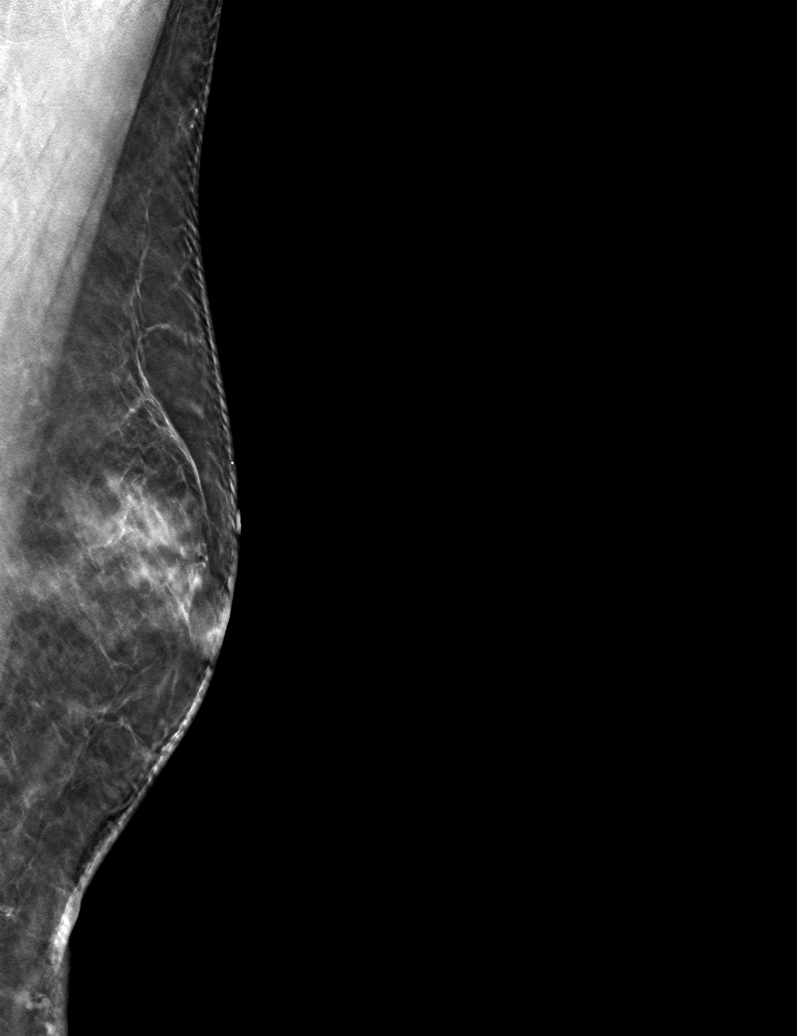

[6 of 30 positions shown; findings below may reference images not displayed]

ACR Breast Density Category b: There are scattered areas of
fibroglandular density.
FINDINGS: Asymmetric flame shaped density demonstrated within the retroareolar
left breast underlying the site of palpable concern. No concerning
masses, calcifications or distortion identified either breast.

Mammographic images were processed with CAD.

On physical exam, there is soft mobile mass palpated within the
retroareolar left breast.

Targeted ultrasound is performed, showing gynecomastia without
suspicious mass within the retroareolar left breast at the site of
palpable concern.
IMPRESSION: Asymmetric gynecomastia, left greater than right.

No mammographic evidence for malignancy.

RECOMMENDATION:
Continued clinical evaluation for left breast palpable abnormality.

I have discussed the findings and recommendations with the patient.
Results were also provided in writing at the conclusion of the
visit. If applicable, a reminder letter will be sent to the patient
regarding the next appointment.

BI-RADS CATEGORY  2: Benign.

## 2018-06-28 ENCOUNTER — Telehealth: Payer: Self-pay | Admitting: *Deleted

## 2018-06-28 NOTE — Telephone Encounter (Signed)
Patient has a 10 week follow up appointment scheduled for 08/30/18 at 3:40. Patient understands he needs to keep this appointment for insurance compliance. Patient was grateful for the call and thanked me.

## 2018-07-17 DIAGNOSIS — N529 Male erectile dysfunction, unspecified: Secondary | ICD-10-CM | POA: Diagnosis not present

## 2018-07-17 DIAGNOSIS — L309 Dermatitis, unspecified: Secondary | ICD-10-CM | POA: Diagnosis not present

## 2018-07-17 DIAGNOSIS — N183 Chronic kidney disease, stage 3 (moderate): Secondary | ICD-10-CM | POA: Diagnosis not present

## 2018-07-17 DIAGNOSIS — I25119 Atherosclerotic heart disease of native coronary artery with unspecified angina pectoris: Secondary | ICD-10-CM | POA: Diagnosis not present

## 2018-07-17 DIAGNOSIS — E7800 Pure hypercholesterolemia, unspecified: Secondary | ICD-10-CM | POA: Diagnosis not present

## 2018-07-17 DIAGNOSIS — I10 Essential (primary) hypertension: Secondary | ICD-10-CM | POA: Diagnosis not present

## 2018-07-17 DIAGNOSIS — R55 Syncope and collapse: Secondary | ICD-10-CM | POA: Diagnosis not present

## 2018-07-17 DIAGNOSIS — E1129 Type 2 diabetes mellitus with other diabetic kidney complication: Secondary | ICD-10-CM | POA: Diagnosis not present

## 2018-08-16 NOTE — Telephone Encounter (Signed)
.  hcev

## 2018-08-29 DIAGNOSIS — M79646 Pain in unspecified finger(s): Secondary | ICD-10-CM | POA: Diagnosis not present

## 2018-08-29 DIAGNOSIS — E1165 Type 2 diabetes mellitus with hyperglycemia: Secondary | ICD-10-CM | POA: Diagnosis not present

## 2018-08-30 ENCOUNTER — Ambulatory Visit: Payer: Medicare Other | Admitting: Cardiology

## 2018-09-05 ENCOUNTER — Other Ambulatory Visit: Payer: Self-pay

## 2018-09-05 ENCOUNTER — Telehealth: Payer: Self-pay

## 2018-09-05 ENCOUNTER — Encounter: Payer: Self-pay | Admitting: Cardiology

## 2018-09-05 ENCOUNTER — Telehealth (INDEPENDENT_AMBULATORY_CARE_PROVIDER_SITE_OTHER): Payer: Medicare Other | Admitting: Cardiology

## 2018-09-05 VITALS — Ht 70.0 in | Wt 204.0 lb

## 2018-09-05 DIAGNOSIS — E669 Obesity, unspecified: Secondary | ICD-10-CM

## 2018-09-05 DIAGNOSIS — I10 Essential (primary) hypertension: Secondary | ICD-10-CM | POA: Diagnosis not present

## 2018-09-05 DIAGNOSIS — G4733 Obstructive sleep apnea (adult) (pediatric): Secondary | ICD-10-CM

## 2018-09-05 NOTE — Patient Instructions (Signed)
Medication Instructions:  Your physician recommends that you continue on your current medications as directed. Please refer to the Current Medication list given to you today.  If you need a refill on your cardiac medications before your next appointment, please call your pharmacy.   Lab work: None If you have labs (blood work) drawn today and your tests are completely normal, you will receive your results only by: . MyChart Message (if you have MyChart) OR . A paper copy in the mail If you have any lab test that is abnormal or we need to change your treatment, we will call you to review the results.  Testing/Procedures: None  Follow-Up: At [facility], you and your health needs are our priority.  As part of our continuing mission to provide you with exceptional heart care, we have created designated Provider Care Teams.  These Care Teams include your primary Cardiologist (physician) and Advanced Practice Providers (APPs -  Physician Assistants and Nurse Practitioners) who all work together to provide you with the care you need, when you need it. You will need a follow up appointment in 1 years.  Please call our office 2 months in advance to schedule this appointment.  You may see Traci Turner, MD or one of the following Advanced Practice Providers on your designated Care Team:   Brittainy Simmons, PA-C Dayna Dunn, PA-C . Michele Lenze, PA-C   

## 2018-09-05 NOTE — Telephone Encounter (Signed)

## 2018-09-05 NOTE — Progress Notes (Signed)
Virtual Visit via Video Note   This visit type was conducted due to national recommendations for restrictions regarding the COVID-19 Pandemic (e.g. social distancing) in an effort to limit this patient's exposure and mitigate transmission in our community.  Due to his co-morbid illnesses, this patient is at least at moderate risk for complications without adequate follow up.  This format is felt to be most appropriate for this patient at this time.  All issues noted in this document were discussed and addressed.  A limited physical exam was performed with this format.  Please refer to the patient's chart for his consent to telehealth for [facility].  Evaluation Performed:  Follow-up visit  This visit type was conducted due to national recommendations for restrictions regarding the COVID-19 Pandemic (e.g. social distancing).  This format is felt to be most appropriate for this patient at this time.  All issues noted in this document were discussed and addressed.  No physical exam was performed (except for noted visual exam findings with Video Visits).  Please refer to the patient's chart (MyChart message for video visits and phone note for telephone visits) for the patient's consent to telehealth for [facility].  Date:  09/05/2018   ID:  Carl, Knapp [birth year], MRN [id number]  Patient Location:  Home  Provider location:   [place]  PCP:  Maury Dus, MD  Cardiologist:  Fransico Him, MD  Electrophysiologist:  None   Chief Complaint:  OSA  History of Present Illness:    Carl Knapp is a 73 y.o. male who presents via audio/video conferencing for a telehealth visit today.    Carl Knapp is a 72 y.o. male with a hx of severe OSA with AHI 37/hr and now on CPAP at 10cm H2O.  He is doing well with his CPAP device and thinks that he has gotten used to it.  He tolerates the mask and feels the pressure is adequate.  Since going on CPAP he feels rested in the am and has no  significant daytime sleepiness.  He denies any significant mouth or nasal dryness or nasal congestion.  He does not think that he snores.   He is back today due to insurance requirements for repeat office visit after getting new Pap device to document compliance.  The patient does not have symptoms concerning for COVID-19 infection (fever, chills, cough, or new shortness of breath).   Prior CV studies:   The following studies were reviewed today:  PAP compliance download  Past Medical History:  Diagnosis Date  . Coronary artery disease 12/2013   20-30% RCA by cath- states was good.  . Diabetes mellitus without complication ([place])    type II  . DVT (deep venous thrombosis) ([place])    bilateral legs-greater on left. -3-5 years ago-tx. coumadin x 1 year.  Marland Kitchen Hx of cardiovascular stress test    ETT-Myoview (7/15):  ECG with ST depression; freq PVCs, + chest pain; normal perfusion  . Hyperlipidemia    LDL goal<100  . Hypertension   . Macular degeneration    mild  . OSA (obstructive sleep apnea)    Severe w AHI 37/hr now on CPAP at 10cm H2O   Past Surgical History:  Procedure Laterality Date  . APPENDECTOMY     open '68  . HERNIA REPAIR Bilateral    inguinal hernia repair '80  , umbilical repair Dr. Lucia Gaskins 06-14-17  . INSERTION OF MESH N/A 06/14/2017   Procedure: INSERTION OF MESH;  Surgeon:  Alphonsa Overall, MD;  Location: [facility];  Service: General;  Laterality: N/A;  . LEFT HEART CATHETERIZATION WITH CORONARY ANGIOGRAM N/A 12/09/2013   Procedure: LEFT HEART CATHETERIZATION WITH CORONARY ANGIOGRAM;  Surgeon: Jettie Booze, MD;  Location: [facility] CATH LAB;  Service: Cardiovascular;  Laterality: N/A;  . UMBILICAL HERNIA REPAIR N/A 06/14/2017   Procedure: LAPAROSCOPIC UMBILICAL HERNIA REPAIR WITH MESH ERAS PATHWAY;  Surgeon: Alphonsa Overall, MD;  Location: [facility];  Service: General;  Laterality: N/A;  ERAS PATHWAY  . VASECTOMY       Current Meds  Medication Sig  . acetaminophen (TYLENOL) 500 MG  tablet Take 1,000 mg by mouth daily as needed for moderate pain or headache.  Marland Kitchen amLODipine (NORVASC) 10 MG tablet Take 10 mg by mouth daily.  Marland Kitchen aspirin EC 81 MG tablet Take 81 mg by mouth daily.  Marland Kitchen HYDROcodone-acetaminophen (NORCO/VICODIN) 5-325 MG tablet Take 1-2 tablets by mouth every 6 (six) hours as needed for moderate pain.  Marland Kitchen KLOR-CON M20 20 MEQ tablet Take 40 mEq by mouth every evening.   Marland Kitchen lisinopril-hydrochlorothiazide (PRINZIDE,ZESTORETIC) 20-12.5 MG per tablet Take 2 tablets by mouth daily.  Marland Kitchen loratadine (CLARITIN) 10 MG tablet Take 10 mg by mouth daily as needed for allergies.  . LUTEIN-ZEAXANTHIN PO Take 1 tablet by mouth daily.  . metFORMIN (GLUCOPHAGE) 1000 MG tablet Take 1,000 mg by mouth 2 (two) times daily with a meal.   . sildenafil (REVATIO) 20 MG tablet Take 40-60 mg by mouth daily as needed (erectile dysfunction).  . simvastatin (ZOCOR) 20 MG tablet Take 20 mg by mouth every morning.      Allergies:   Patient has no known allergies.   Social History   Tobacco Use  . Smoking status: Former Smoker    Types: Cigarettes    Last attempt to quit: 06/08/2010    Years since quitting: 8.2  . Smokeless tobacco: Never Used  Substance Use Topics  . Alcohol use: Yes    Comment: rare beer or glass of wine  . Drug use: No     Family Hx: The patient's family history includes AAA (abdominal aortic aneurysm) in his father; CAD in his father; Heart attack in his father; Hypertension in an other family member.  ROS:   Please see the history of present illness.     All other systems reviewed and are negative.   Labs/Other Tests and Data Reviewed:    Recent Labs: No results found for requested labs within last 8760 hours.   Recent Lipid Panel No results found for: CHOL, TRIG, HDL, CHOLHDL, LDLCALC, LDLDIRECT  Wt Readings from Last 3 Encounters:  09/05/18 204 lb (92.5 kg)  06/13/18 218 lb 9.6 oz (99.2 kg)  06/14/17 208 lb (94.3 kg)     Objective:    Vital Signs:  Ht  5\' 10"  (1.778 m)   Wt 204 lb (92.5 kg)   BMI 29.27 kg/m    CONSTITUTIONAL:  Well nourished, well developed male in no acute distress.  EYES: anicteric MOUTH: oral mucosa is pink RESPIRATORY: Normal respiratory effort, symmetric expansion CARDIOVASCULAR: No peripheral edema SKIN: No rash, lesions or ulcers MUSCULOSKELETAL: no digital cyanosis NEURO: Cranial Nerves II-XII grossly intact, moves all extremities PSYCH: Intact judgement and insight.  A&O x 3, Mood/affect appropriate   ASSESSMENT & PLAN:    1.  OSA - the patient is tolerating PAP therapy well without any problems. The PAP download was reviewed today and showed an AHI of 1.6/hr on auto PAP with 87%  compliance in using more than 4 hours nightly.  The patient has been using and benefiting from PAP use and will continue to benefit from therapy.   2.  Hypertension -  He will continue on amlodipine 10 mg daily, lisinopril HCT 20-12.5 mg 2 tablets daily.  His last creatinine was 1.27 on 07/17/2018.  3.  Obesity -he has changed his diet completely and is not eating any processed foods.  He has lost over 7 pounds.  I congratulated him and encouraged him to continue with his diet and exercise program.  4.  COVID-19 Education:The signs and symptoms of COVID-19 were discussed with the patient and how to seek care for testing (follow up with PCP or arrange E-visit).  The importance of social distancing was discussed today.  Patient Risk:   After full review of this patient's clinical status, I feel that they are at least moderate risk at this time.  Time:   Today, I have spent 15 minutes directly with the patient on video discussing medical problems including OSA HTN and obesity.  We also reviewed the symptoms of COVID 19 and the ways to protect against contracting the virus with telehealth technology.  I spent an additional 5 minutes reviewing patient's chart including PAP compliance download.  Medication Adjustments/Labs and Tests  Ordered: Current medicines are reviewed at length with the patient today.  Concerns regarding medicines are outlined above.  Tests Ordered: No orders of the defined types were placed in this encounter.  Medication Changes: No orders of the defined types were placed in this encounter.   Disposition:  Follow up in 1 year(s)  Signed, Fransico Him, MD  09/05/2018 12:49 PM    [place]

## 2018-11-15 DIAGNOSIS — E1129 Type 2 diabetes mellitus with other diabetic kidney complication: Secondary | ICD-10-CM | POA: Diagnosis not present

## 2018-11-15 DIAGNOSIS — Z125 Encounter for screening for malignant neoplasm of prostate: Secondary | ICD-10-CM | POA: Diagnosis not present

## 2018-11-15 DIAGNOSIS — R55 Syncope and collapse: Secondary | ICD-10-CM | POA: Diagnosis not present

## 2018-11-15 DIAGNOSIS — N183 Chronic kidney disease, stage 3 (moderate): Secondary | ICD-10-CM | POA: Diagnosis not present

## 2018-11-15 DIAGNOSIS — I25119 Atherosclerotic heart disease of native coronary artery with unspecified angina pectoris: Secondary | ICD-10-CM | POA: Diagnosis not present

## 2018-11-15 DIAGNOSIS — N529 Male erectile dysfunction, unspecified: Secondary | ICD-10-CM | POA: Diagnosis not present

## 2018-11-15 DIAGNOSIS — L309 Dermatitis, unspecified: Secondary | ICD-10-CM | POA: Diagnosis not present

## 2018-11-15 DIAGNOSIS — E7800 Pure hypercholesterolemia, unspecified: Secondary | ICD-10-CM | POA: Diagnosis not present

## 2018-11-15 DIAGNOSIS — I10 Essential (primary) hypertension: Secondary | ICD-10-CM | POA: Diagnosis not present

## 2019-01-29 DIAGNOSIS — H9193 Unspecified hearing loss, bilateral: Secondary | ICD-10-CM | POA: Diagnosis not present

## 2019-01-29 DIAGNOSIS — Z125 Encounter for screening for malignant neoplasm of prostate: Secondary | ICD-10-CM | POA: Diagnosis not present

## 2019-01-29 DIAGNOSIS — Z23 Encounter for immunization: Secondary | ICD-10-CM | POA: Diagnosis not present

## 2019-01-29 DIAGNOSIS — E1129 Type 2 diabetes mellitus with other diabetic kidney complication: Secondary | ICD-10-CM | POA: Diagnosis not present

## 2019-01-29 DIAGNOSIS — E7800 Pure hypercholesterolemia, unspecified: Secondary | ICD-10-CM | POA: Diagnosis not present

## 2019-02-03 DIAGNOSIS — E7800 Pure hypercholesterolemia, unspecified: Secondary | ICD-10-CM | POA: Diagnosis not present

## 2019-02-03 DIAGNOSIS — I10 Essential (primary) hypertension: Secondary | ICD-10-CM | POA: Diagnosis not present

## 2019-02-03 DIAGNOSIS — N529 Male erectile dysfunction, unspecified: Secondary | ICD-10-CM | POA: Diagnosis not present

## 2019-02-03 DIAGNOSIS — I25119 Atherosclerotic heart disease of native coronary artery with unspecified angina pectoris: Secondary | ICD-10-CM | POA: Diagnosis not present

## 2019-02-03 DIAGNOSIS — G4733 Obstructive sleep apnea (adult) (pediatric): Secondary | ICD-10-CM | POA: Diagnosis not present

## 2019-02-03 DIAGNOSIS — Z6830 Body mass index (BMI) 30.0-30.9, adult: Secondary | ICD-10-CM | POA: Diagnosis not present

## 2019-02-03 DIAGNOSIS — E1129 Type 2 diabetes mellitus with other diabetic kidney complication: Secondary | ICD-10-CM | POA: Diagnosis not present

## 2019-02-03 DIAGNOSIS — N183 Chronic kidney disease, stage 3 (moderate): Secondary | ICD-10-CM | POA: Diagnosis not present

## 2019-02-03 DIAGNOSIS — R55 Syncope and collapse: Secondary | ICD-10-CM | POA: Diagnosis not present

## 2019-02-03 DIAGNOSIS — Z1389 Encounter for screening for other disorder: Secondary | ICD-10-CM | POA: Diagnosis not present

## 2019-02-03 DIAGNOSIS — L309 Dermatitis, unspecified: Secondary | ICD-10-CM | POA: Diagnosis not present

## 2019-02-03 DIAGNOSIS — Z0000 Encounter for general adult medical examination without abnormal findings: Secondary | ICD-10-CM | POA: Diagnosis not present

## 2019-02-16 DIAGNOSIS — T161XXA Foreign body in right ear, initial encounter: Secondary | ICD-10-CM | POA: Diagnosis not present

## 2019-03-04 DIAGNOSIS — E1165 Type 2 diabetes mellitus with hyperglycemia: Secondary | ICD-10-CM | POA: Diagnosis not present

## 2019-03-04 DIAGNOSIS — I25119 Atherosclerotic heart disease of native coronary artery with unspecified angina pectoris: Secondary | ICD-10-CM | POA: Diagnosis not present

## 2019-03-04 DIAGNOSIS — E1129 Type 2 diabetes mellitus with other diabetic kidney complication: Secondary | ICD-10-CM | POA: Diagnosis not present

## 2019-03-04 DIAGNOSIS — E7800 Pure hypercholesterolemia, unspecified: Secondary | ICD-10-CM | POA: Diagnosis not present

## 2019-03-04 DIAGNOSIS — I10 Essential (primary) hypertension: Secondary | ICD-10-CM | POA: Diagnosis not present

## 2019-05-08 DIAGNOSIS — E1165 Type 2 diabetes mellitus with hyperglycemia: Secondary | ICD-10-CM | POA: Diagnosis not present

## 2019-05-08 DIAGNOSIS — I25119 Atherosclerotic heart disease of native coronary artery with unspecified angina pectoris: Secondary | ICD-10-CM | POA: Diagnosis not present

## 2019-05-08 DIAGNOSIS — I10 Essential (primary) hypertension: Secondary | ICD-10-CM | POA: Diagnosis not present

## 2019-05-08 DIAGNOSIS — E1129 Type 2 diabetes mellitus with other diabetic kidney complication: Secondary | ICD-10-CM | POA: Diagnosis not present

## 2019-05-08 DIAGNOSIS — E7800 Pure hypercholesterolemia, unspecified: Secondary | ICD-10-CM | POA: Diagnosis not present

## 2019-06-09 ENCOUNTER — Ambulatory Visit: Payer: Medicare Other

## 2019-06-15 ENCOUNTER — Ambulatory Visit: Payer: Medicare Other | Attending: Internal Medicine

## 2019-06-15 DIAGNOSIS — Z23 Encounter for immunization: Secondary | ICD-10-CM

## 2019-06-15 NOTE — Progress Notes (Signed)
   Covid-19 Vaccination Clinic  Name:  Carl Knapp    MRN: [id number] DOB: 1945/05/13  06/15/2019  [name] was observed post Covid-19 immunization for 15 minutes without incidence. He was provided with Vaccine Information Sheet and instruction to access the V-Safe system.   [name] was instructed to call 911 with any severe reactions post vaccine: Marland Kitchen Difficulty breathing  . Swelling of your face and throat  . A fast heartbeat  . A bad rash all over your body  . Dizziness and weakness    Immunizations Administered    Name Date Dose VIS Date Route   Pfizer COVID-19 Vaccine 06/15/2019  8:41 AM 0.3 mL 04/18/2019 Intramuscular   Manufacturer: [place]   Lot: [id number]   [place]: [id number]

## 2019-07-03 DIAGNOSIS — E7800 Pure hypercholesterolemia, unspecified: Secondary | ICD-10-CM | POA: Diagnosis not present

## 2019-07-03 DIAGNOSIS — I25119 Atherosclerotic heart disease of native coronary artery with unspecified angina pectoris: Secondary | ICD-10-CM | POA: Diagnosis not present

## 2019-07-03 DIAGNOSIS — I10 Essential (primary) hypertension: Secondary | ICD-10-CM | POA: Diagnosis not present

## 2019-07-03 DIAGNOSIS — E1165 Type 2 diabetes mellitus with hyperglycemia: Secondary | ICD-10-CM | POA: Diagnosis not present

## 2019-07-03 DIAGNOSIS — E1129 Type 2 diabetes mellitus with other diabetic kidney complication: Secondary | ICD-10-CM | POA: Diagnosis not present

## 2019-07-09 ENCOUNTER — Ambulatory Visit: Payer: Medicare Other | Attending: Internal Medicine

## 2019-07-09 DIAGNOSIS — Z23 Encounter for immunization: Secondary | ICD-10-CM

## 2019-07-09 NOTE — Progress Notes (Signed)
   Covid-19 Vaccination Clinic  Name:  Carl Knapp    MRN: [id number] DOB: 31-Mar-1946  07/09/2019  [name] was observed post Covid-19 immunization for 15 minutes without incident. He was provided with Vaccine Information Sheet and instruction to access the V-Safe system.   [name] was instructed to call 911 with any severe reactions post vaccine: Marland Kitchen Difficulty breathing  . Swelling of face and throat  . A fast heartbeat  . A bad rash all over body  . Dizziness and weakness   Immunizations Administered    Name Date Dose VIS Date Route   Pfizer COVID-19 Vaccine 07/09/2019  3:56 PM 0.3 mL 04/18/2019 Intramuscular   Manufacturer: [place]   Lot: [id number]   [place]: [id number]

## 2019-07-24 DIAGNOSIS — M1711 Unilateral primary osteoarthritis, right knee: Secondary | ICD-10-CM | POA: Diagnosis not present

## 2019-07-24 DIAGNOSIS — M25561 Pain in right knee: Secondary | ICD-10-CM | POA: Diagnosis not present

## 2019-07-24 DIAGNOSIS — M25562 Pain in left knee: Secondary | ICD-10-CM | POA: Diagnosis not present

## 2019-07-24 DIAGNOSIS — M170 Bilateral primary osteoarthritis of knee: Secondary | ICD-10-CM | POA: Diagnosis not present

## 2019-07-24 DIAGNOSIS — M1712 Unilateral primary osteoarthritis, left knee: Secondary | ICD-10-CM | POA: Diagnosis not present

## 2019-08-26 DIAGNOSIS — M25561 Pain in right knee: Secondary | ICD-10-CM | POA: Diagnosis not present

## 2019-08-26 DIAGNOSIS — M25562 Pain in left knee: Secondary | ICD-10-CM | POA: Diagnosis not present

## 2019-09-01 DIAGNOSIS — I10 Essential (primary) hypertension: Secondary | ICD-10-CM | POA: Diagnosis not present

## 2019-09-01 DIAGNOSIS — E1129 Type 2 diabetes mellitus with other diabetic kidney complication: Secondary | ICD-10-CM | POA: Diagnosis not present

## 2019-09-01 DIAGNOSIS — E1165 Type 2 diabetes mellitus with hyperglycemia: Secondary | ICD-10-CM | POA: Diagnosis not present

## 2019-09-01 DIAGNOSIS — E7800 Pure hypercholesterolemia, unspecified: Secondary | ICD-10-CM | POA: Diagnosis not present

## 2019-09-01 DIAGNOSIS — N183 Chronic kidney disease, stage 3 unspecified: Secondary | ICD-10-CM | POA: Diagnosis not present

## 2019-09-01 DIAGNOSIS — I25119 Atherosclerotic heart disease of native coronary artery with unspecified angina pectoris: Secondary | ICD-10-CM | POA: Diagnosis not present

## 2019-09-16 ENCOUNTER — Ambulatory Visit (INDEPENDENT_AMBULATORY_CARE_PROVIDER_SITE_OTHER): Payer: Medicare Other | Admitting: Cardiology

## 2019-09-16 ENCOUNTER — Other Ambulatory Visit: Payer: Self-pay

## 2019-09-16 ENCOUNTER — Encounter: Payer: Self-pay | Admitting: Cardiology

## 2019-09-16 VITALS — BP 142/68 | HR 63 | Ht 70.0 in | Wt 222.0 lb

## 2019-09-16 DIAGNOSIS — R072 Precordial pain: Secondary | ICD-10-CM

## 2019-09-16 DIAGNOSIS — R931 Abnormal findings on diagnostic imaging of heart and coronary circulation: Secondary | ICD-10-CM | POA: Diagnosis not present

## 2019-09-16 DIAGNOSIS — I10 Essential (primary) hypertension: Secondary | ICD-10-CM

## 2019-09-16 DIAGNOSIS — R079 Chest pain, unspecified: Secondary | ICD-10-CM

## 2019-09-16 DIAGNOSIS — G4733 Obstructive sleep apnea (adult) (pediatric): Secondary | ICD-10-CM

## 2019-09-16 DIAGNOSIS — E669 Obesity, unspecified: Secondary | ICD-10-CM

## 2019-09-16 MED ORDER — METOPROLOL TARTRATE 100 MG PO TABS: 100.0000 mg | ORAL_TABLET | Freq: Once | ORAL | 0 refills | Status: DC

## 2019-09-16 NOTE — Addendum Note (Signed)
Addended by: Antonieta Iba on: 09/16/2019 09:53 AM   Modules accepted: Orders

## 2019-09-16 NOTE — Patient Instructions (Addendum)
Medication Instructions:  Your physician recommends that you continue on your current medications as directed. Please refer to the Current Medication list given to you today.  *If you need a refill on your cardiac medications before your next appointment, please call your pharmacy*   Testing/Procedures: Your provider recommends that you have a Coronary CTA.    Follow-Up: At [facility], you and your health needs are our priority.  As part of our continuing mission to provide you with exceptional heart care, we have created designated Provider Care Teams.  These Care Teams include your primary Cardiologist (physician) and Advanced Practice Providers (APPs -  Physician Assistants and Nurse Practitioners) who all work together to provide you with the care you need, when you need it.  Your next appointment:   1 year(s)  The format for your next appointment:   In Person  Provider:   You may see Fransico Him, MD or one of the following Advanced Practice Providers on your designated Care Team:    Melina Copa, PA-C  Ermalinda Barrios, PA-C    Other Instructions Your cardiac CT will be scheduled at one of the below locations:   [facility] [address] [place], [place] [address] [phone]  Please arrive at the [facility] main entrance of [facility] 30 minutes prior to test start time. Proceed to the [facility] Radiology Department (first floor) to check-in and test prep.  Please follow these instructions carefully (unless otherwise directed):  Hold all erectile dysfunction medications at least 3 days (72 hrs) prior to test.  On the Night Before the Test: . Be sure to Drink plenty of water. . Do not consume any caffeinated/decaffeinated beverages or chocolate 12 hours prior to your test. . Do not take any antihistamines 12 hours prior to your test. . If you take Metformin do not take 24 hours prior to test.  On the Day of the Test: . Drink plenty of  water. Do not drink any water within one hour of the test. . Do not eat any food 4 hours prior to the test. . You may take your regular medications prior to the test.  . Take metoprolol (Lopressor) two hours prior to test. . HOLD Hydrochlorothiazide morning of the test.   After the Test: . Drink plenty of water. . After receiving IV contrast, you may experience a mild flushed feeling. This is normal. . On occasion, you may experience a mild rash up to 24 hours after the test. This is not dangerous. If this occurs, you can take Benadryl 25 mg and increase your fluid intake. . If you experience trouble breathing, this can be serious. If it is severe call 911 IMMEDIATELY. If it is mild, please call our office. . If you take any of these medications: Metformin, Avandament, Glucavance, please do not take 48 hours after completing test unless otherwise instructed.   Once we have confirmed authorization from your insurance company, we will call you to set up a date and time for your test.   For non-scheduling related questions, please contact the cardiac imaging nurse navigator should you have any questions/concerns: Marchia Bond, RN Navigator Cardiac Imaging [facility] [phone] office  For scheduling needs, including cancellations and rescheduling, please call [phone].

## 2019-09-16 NOTE — Progress Notes (Addendum)
Cardiology Office Note:    Date:  09/16/2019   ID:  JEP STJULIAN, DOB [birth year], MRN [id number]  PCP:  Maury Dus, MD  Cardiologist:  Fransico Him, MD    Referring MD: Maury Dus, MD   Chief Complaint  Patient presents with  . Sleep Apnea    History of Present Illness:    Carl Knapp is a 74 y.o. male with a hx of severe OSA with AHI 37/hr and now on CPAP at 10cm H2O.  He is doing well with his CPAP device and thinks that he has gotten used to it.  He tolerates the mask and feels the pressure is adequate.  Since going on CPAP he feels rested in the am and has no significant daytime sleepiness.  He denies any significant mouth or nasal dryness or nasal congestion.  He does not think that he snores.    He tells me that about a month ago he started having chest pain that was intermittent and would be a tightness across his chest and down his left shoulder and back between his shoulder blades associated at times with nausea, diaphoresis and would come and go.  Recently he has not noticed it.  He thought it might be related to GERD.  He denies any SOB or DOE.    Past Medical History:  Diagnosis Date  . Coronary artery disease 12/2013   20-30% RCA by cath- states was good.  . Diabetes mellitus without complication ([place])    type II  . DVT (deep venous thrombosis) ([place])    bilateral legs-greater on left. -3-5 years ago-tx. coumadin x 1 year.  Marland Kitchen Hx of cardiovascular stress test    ETT-Myoview (7/15):  ECG with ST depression; freq PVCs, + chest pain; normal perfusion  . Hyperlipidemia    LDL goal<100  . Hypertension   . Macular degeneration    mild  . OSA (obstructive sleep apnea)    Severe w AHI 37/hr now on CPAP at 10cm H2O    Past Surgical History:  Procedure Laterality Date  . APPENDECTOMY     open '68  . HERNIA REPAIR Bilateral    inguinal hernia repair '80  , umbilical repair Dr. Lucia Gaskins 06-14-17  . INSERTION OF MESH N/A 06/14/2017   Procedure: INSERTION OF MESH;   Surgeon: Alphonsa Overall, MD;  Location: [facility];  Service: General;  Laterality: N/A;  . LEFT HEART CATHETERIZATION WITH CORONARY ANGIOGRAM N/A 12/09/2013   Procedure: LEFT HEART CATHETERIZATION WITH CORONARY ANGIOGRAM;  Surgeon: Jettie Booze, MD;  Location: [facility] CATH LAB;  Service: Cardiovascular;  Laterality: N/A;  . UMBILICAL HERNIA REPAIR N/A 06/14/2017   Procedure: LAPAROSCOPIC UMBILICAL HERNIA REPAIR WITH MESH ERAS PATHWAY;  Surgeon: Alphonsa Overall, MD;  Location: [facility];  Service: General;  Laterality: N/A;  ERAS PATHWAY  . VASECTOMY      Current Medications: Current Meds  Medication Sig  . acetaminophen (TYLENOL) 500 MG tablet Take 1,000 mg by mouth daily as needed for moderate pain or headache.  Marland Kitchen amLODipine (NORVASC) 10 MG tablet Take 10 mg by mouth daily.  Marland Kitchen aspirin EC 81 MG tablet Take 81 mg by mouth daily.  Marland Kitchen HYDROcodone-acetaminophen (NORCO/VICODIN) 5-325 MG tablet Take 1-2 tablets by mouth every 6 (six) hours as needed for moderate pain.  Marland Kitchen KLOR-CON M20 20 MEQ tablet Take 40 mEq by mouth every evening.   Marland Kitchen lisinopril-hydrochlorothiazide (PRINZIDE,ZESTORETIC) 20-12.5 MG per tablet Take 2 tablets by mouth daily.  Marland Kitchen loratadine (CLARITIN) 10 MG  tablet Take 10 mg by mouth daily as needed for allergies.  . LUTEIN-ZEAXANTHIN PO Take 1 tablet by mouth daily.  . metFORMIN (GLUCOPHAGE) 1000 MG tablet Take 1,000 mg by mouth 2 (two) times daily with a meal.   . sildenafil (REVATIO) 20 MG tablet Take 40-60 mg by mouth daily as needed (erectile dysfunction).  . simvastatin (ZOCOR) 20 MG tablet Take 20 mg by mouth every morning.      Allergies:   Patient has no known allergies.   Social History   Socioeconomic History  . Marital status: Married    Spouse name: Not on file  . Number of children: Not on file  . Years of education: Not on file  . Highest education level: Not on file  Occupational History  . Not on file  Tobacco Use  . Smoking status: Former Smoker    Types:  Cigarettes    Quit date: 06/08/2010    Years since quitting: 9.2  . Smokeless tobacco: Never Used  Substance and Sexual Activity  . Alcohol use: Yes    Comment: rare beer or glass of wine  . Drug use: No  . Sexual activity: Yes  Other Topics Concern  . Not on file  Social History Narrative  . Not on file   Social Determinants of Health   Financial Resource Strain:   . Difficulty of Paying Living Expenses:   Food Insecurity:   . Worried About Charity fundraiser in the Last Year:   . Arboriculturist in the Last Year:   Transportation Needs:   . Film/video editor (Medical):   Marland Kitchen Lack of Transportation (Non-Medical):   Physical Activity:   . Days of Exercise per Week:   . Minutes of Exercise per Session:   Stress:   . Feeling of Stress :   Social Connections:   . Frequency of Communication with Friends and Family:   . Frequency of Social Gatherings with Friends and Family:   . Attends Religious Services:   . Active Member of Clubs or Organizations:   . Attends Archivist Meetings:   Marland Kitchen Marital Status:      Family History: The patient's family history includes AAA (abdominal aortic aneurysm) in his father; CAD in his father; Heart attack in his father; Hypertension in an other family member.  ROS:   Please see the history of present illness.    ROS  All other systems reviewed and negative.   EKGs/Labs/Other Studies Reviewed:    The following studies were reviewed today: PAP compliance download  EKG:  EKG is ordered today and showed NSR with no ST changes  Recent Labs: No results found for requested labs within last 8760 hours.   Recent Lipid Panel No results found for: CHOL, TRIG, HDL, CHOLHDL, VLDL, LDLCALC, LDLDIRECT  Physical Exam:    VS:  BP (!) 142/68   Pulse 63   Ht 5\' 10"  (1.778 m)   Wt 222 lb (100.7 kg)   BMI 31.85 kg/m     Wt Readings from Last 3 Encounters:  09/16/19 222 lb (100.7 kg)  09/05/18 204 lb (92.5 kg)  06/13/18 218 lb  9.6 oz (99.2 kg)     GEN:  Well nourished, well developed in no acute distress HEENT: Normal NECK: No JVD; No carotid bruits LYMPHATICS: No lymphadenopathy CARDIAC: RRR, no murmurs, rubs, gallops RESPIRATORY:  Clear to auscultation without rales, wheezing or rhonchi  ABDOMEN: Soft, non-tender, non-distended MUSCULOSKELETAL:  No edema; No  deformity  SKIN: Warm and dry NEUROLOGIC:  Alert and oriented x 3 PSYCHIATRIC:  Normal affect   ASSESSMENT:    1. Obstructive sleep apnea   2. Essential hypertension, benign   3. Obesity (BMI 30-39.9)   4. Chest pain of uncertain etiology    PLAN:    In order of problems listed above:  1.  OSA - The patient is tolerating PAP therapy well without any problems. The PAP download was reviewed today and showed an AHI of 1.3/hr on auto  with 93% compliance in using more than 4 hours nightly.  The patient has been using and benefiting from PAP use and will continue to benefit from therapy.   2.  HTN -BP controlled on exam -continue Amlodipine 10mg  daily, Lisinopril 20-12.5mg  2 tabs daily.  3.  Obesity -I have encouraged him to get into a routine exercise program and cut back on carbs and portions.   4.  Chest pain -symptoms somewhat concerning for angina -he has CRFs including DM, HLD, HTN, obesity, fm hx of CAD and remote tobacco use.  -I will get a coronary CTA to assess further   Medication Adjustments/Labs and Tests Ordered: Current medicines are reviewed at length with the patient today.  Concerns regarding medicines are outlined above.  Orders Placed This Encounter  Procedures  . EKG 12-Lead   No orders of the defined types were placed in this encounter.   Signed, Fransico Him, MD  09/16/2019 9:42 AM    [place]

## 2019-09-29 ENCOUNTER — Other Ambulatory Visit: Payer: Self-pay

## 2019-09-29 ENCOUNTER — Telehealth (HOSPITAL_COMMUNITY): Payer: Self-pay | Admitting: *Deleted

## 2019-09-29 ENCOUNTER — Other Ambulatory Visit: Payer: Medicare Other | Admitting: *Deleted

## 2019-09-29 DIAGNOSIS — E119 Type 2 diabetes mellitus without complications: Secondary | ICD-10-CM | POA: Insufficient documentation

## 2019-09-29 DIAGNOSIS — I10 Essential (primary) hypertension: Secondary | ICD-10-CM | POA: Diagnosis not present

## 2019-09-29 LAB — BASIC METABOLIC PANEL
BUN/Creatinine Ratio: 20 (ref 10–24)
BUN: 24 mg/dL (ref 8–27)
CO2: 22 mmol/L (ref 20–29)
Calcium: 9.1 mg/dL (ref 8.6–10.2)
Chloride: 106 mmol/L (ref 96–106)
Creatinine, Ser: 1.23 mg/dL (ref 0.76–1.27)
GFR calc Af Amer: 67 mL/min/{1.73_m2} (ref >59)
GFR calc non Af Amer: 58 mL/min/{1.73_m2} — ABNORMAL LOW (ref >59)
Glucose: 156 mg/dL — ABNORMAL HIGH (ref 65–99)
Potassium: 4.4 mmol/L (ref 3.5–5.2)
Sodium: 143 mmol/L (ref 134–144)

## 2019-09-29 NOTE — Telephone Encounter (Signed)

## 2019-09-29 NOTE — Telephone Encounter (Signed)
Attempted to call patient regarding upcoming cardiac CT appointment. Left message on voicemail with name and callback number  Lynnda Wiersma Tai RN Navigator Cardiac Imaging [facility] [phone] Office [phone] Cell  

## 2019-09-30 ENCOUNTER — Ambulatory Visit (HOSPITAL_COMMUNITY)
Admission: RE | Admit: 2019-09-30 | Discharge: 2019-09-30 | Disposition: A | Discharge status: Home / Self Care | Payer: Medicare Other | Source: Ambulatory Visit | Attending: Cardiology | Admitting: Cardiology

## 2019-09-30 DIAGNOSIS — R072 Precordial pain: Secondary | ICD-10-CM

## 2019-09-30 IMAGING — CT CT HEART MORP W/ CTA COR W/ SCORE W/ CA W/CM &/OR W/O CM
4 of 7 series · 8 of 20 positions shown, 9 images · non-contrast
Comparison: None.
COMPARISON: None.

Addendum:
EXAM:
OVER-READ INTERPRETATION  CT CHEST

The following report is an over-read performed by radiologist Dr.
VANMETER [REDACTED] on [DATE]. This
over-read does not include interpretation of cardiac or coronary
anatomy or pathology. The coronary CTA interpretation by the
cardiologist is attached.
TECHNIQUE: The patient was scanned on a [employer] Force scanner.

[Series 6: best diast 70 % · axial · 0.39mm/px · z∈[+1173,+1220]mm · 2 of 353 slices shown, 3 images]
[im 118/353  vessel]
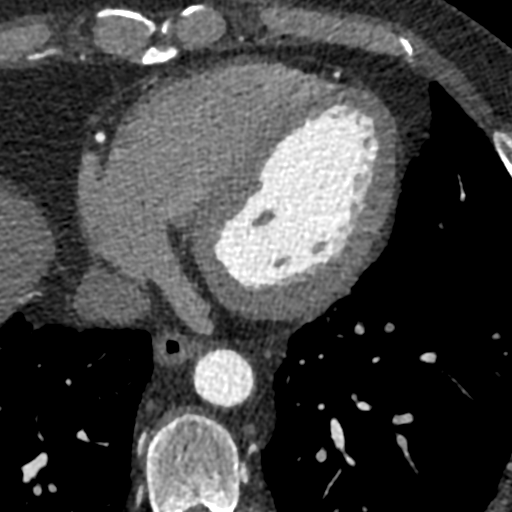
[im 118/353  lung]
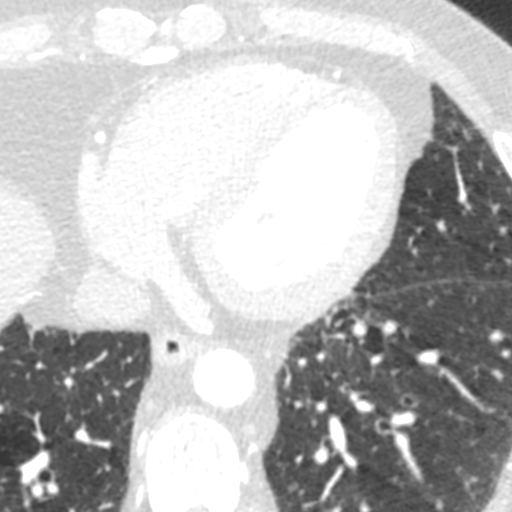
[im 235/353  vessel]
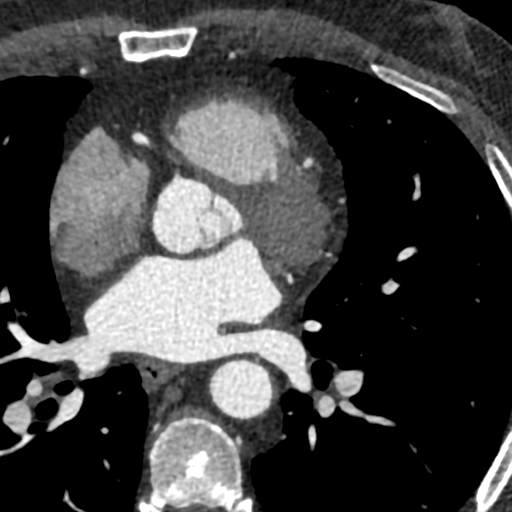

[Series 7: best syst 33 % · axial · 0.39mm/px · z∈[+1173,+1220]mm · 2 of 353 slices shown]
[im 118/353  vessel]
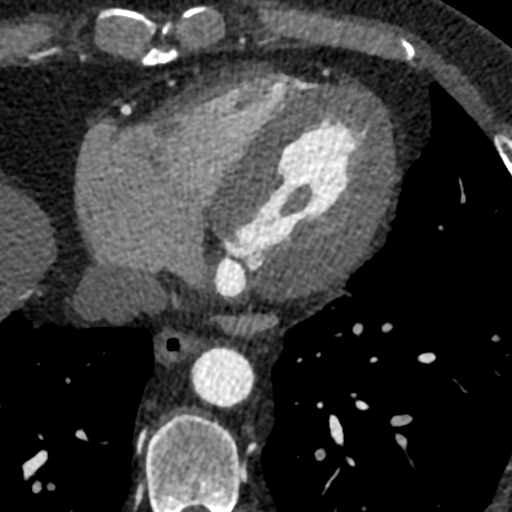
[im 235/353  vessel]
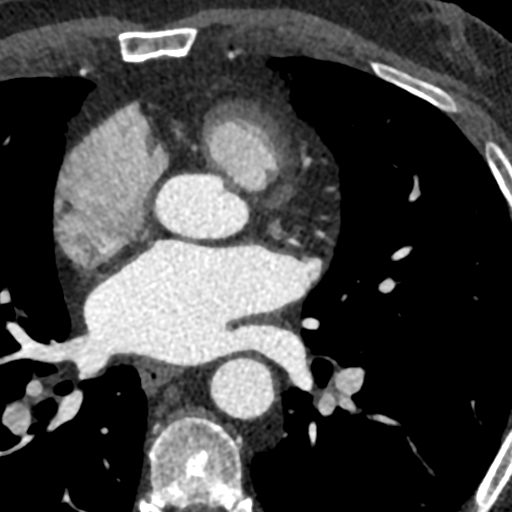

[Series 8: ts diast sharp 70 % · axial · 0.39mm/px · z∈[+1173,+1220]mm · 2 of 353 slices shown]
[im 118/353  lung]
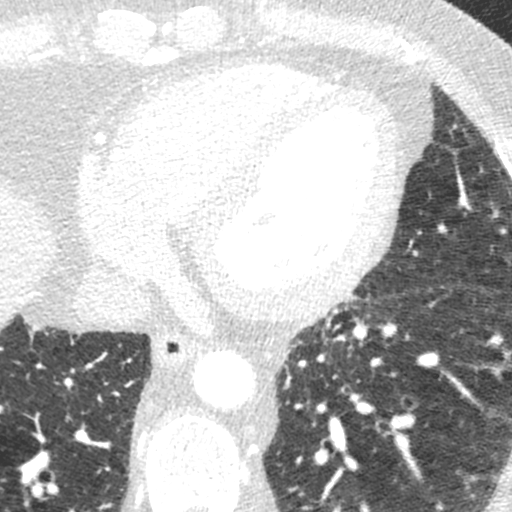
[im 235/353  lung]
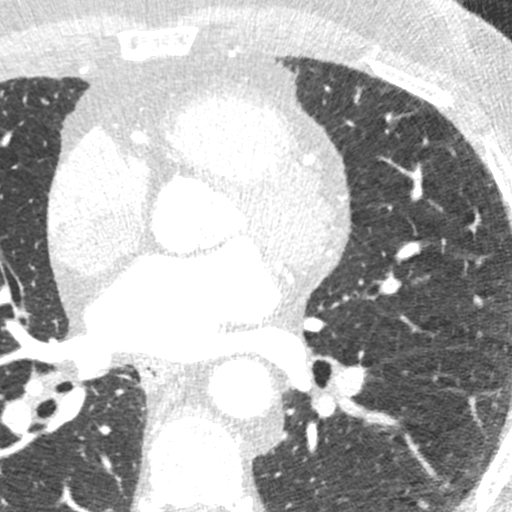

[Series 9: ts syst sharp 33 % · axial · 0.39mm/px · z∈[+1173,+1220]mm · 2 of 353 slices shown]
[im 118/353  lung]
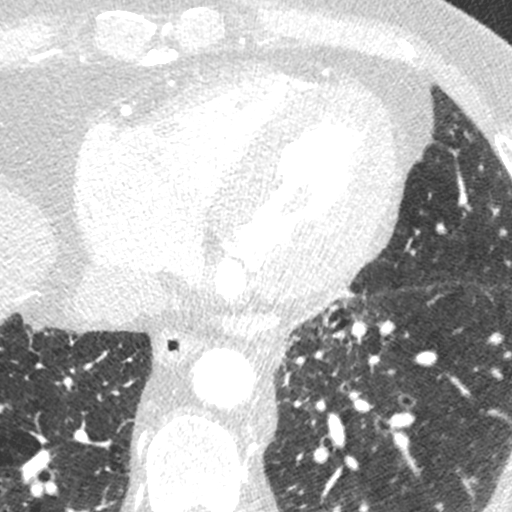
[im 235/353  lung]
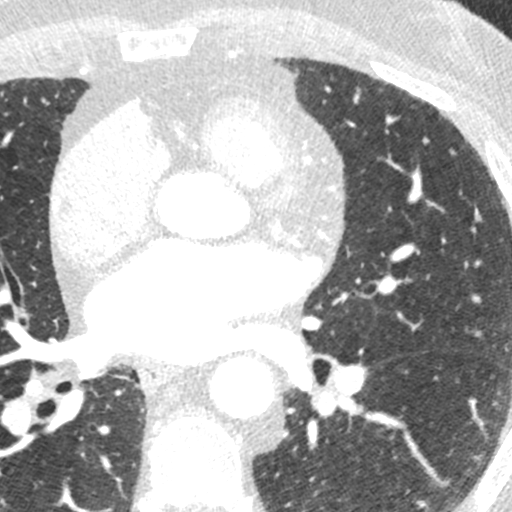

[8 of 20 positions shown; findings below may reference images not displayed]

FINDINGS: Vascular: No incidental findings.

Mediastinum/Nodes: No visualized enlarged lymph nodes or abnormal
masses.

Lungs/Pleura: Visualized lungs show no evidence of pulmonary edema,
consolidation, pneumothorax, nodule or pleural fluid.

Upper Abdomen: No acute abnormality.

Musculoskeletal: No chest wall mass or suspicious bone lesions
identified.
IMPRESSION: No significant incidental findings.

EXAM:
Cardiac/Coronary  CT
FINDINGS: A 120 kV prospective scan was triggered in the descending thoracic
aorta at 111 HU's. Axial non-contrast 3 mm slices were carried out
through the heart. The data set was analyzed on a dedicated work
station and scored using the Agatson method. Gantry rotation speed
was 250 msecs and collimation was .6 mm. No beta blockade and 0.8 mg
of sl NTG was given. The 3D data set was reconstructed in 5%
intervals of the 67-82 % of the R-R cycle. Diastolic phases were
analyzed on a dedicated work station using MPR, MIP and VRT modes.
The patient received 80 cc of contrast.

Aorta:  Normal size.  No calcifications.  No dissection.

Aortic Valve:  Trileaflet.  No calcifications.

Coronary Arteries:  Normal coronary origin.  Right dominance.

RCA is a large dominant artery that gives rise to PDA and PLVB.
There is mild noncalcified plaque in the mid RCA with associated
stenosis of 25-49%.

Left main is a large artery that gives rise to LAD and LCX arteries.
There is no plaque.

LAD is a large vessel that gives rise to a modarate sized D1. There
is mild noncalcified plaque in the mid LAD with associated stenosis
of 25-49%.

LCX is a non-dominant artery that gives rise to very small OM1
branch, moderate sized OM2 branch, very small OM3 and moderate sized
OM4 branch. There is no plaque.

Other findings:

Normal pulmonary vein drainage into the left atrium.

Normal let atrial appendage without a thrombus.

Normal size of the pulmonary artery.
IMPRESSION: 1. Coronary calcium score of 0. This was 0 percentile for age and
sex matched control.

2.  Normal coronary origin with right dominance.

3.  Mild non-calcified plaque in the mid LAD and RCA.  CAD-RADS 2.

4.  Consider non-atherosclerotic causes of chest pain.

5.  Consider preventive therapy and risk factor modification.

VANMETER

*** End of Addendum ***
EXAM:
OVER-READ INTERPRETATION  CT CHEST

The following report is an over-read performed by radiologist Dr.
VANMETER [REDACTED] on [DATE]. This
over-read does not include interpretation of cardiac or coronary
anatomy or pathology. The coronary CTA interpretation by the
cardiologist is attached.
FINDINGS: Vascular: No incidental findings.

Mediastinum/Nodes: No visualized enlarged lymph nodes or abnormal
masses.

Lungs/Pleura: Visualized lungs show no evidence of pulmonary edema,
consolidation, pneumothorax, nodule or pleural fluid.

Upper Abdomen: No acute abnormality.

Musculoskeletal: No chest wall mass or suspicious bone lesions
identified.
IMPRESSION: No significant incidental findings.

## 2019-09-30 MED ORDER — IOHEXOL 350 MG/ML SOLN
100.0000 mL | Freq: Once | INTRAVENOUS | Status: AC | PRN
  Administered 2019-09-30: 100 mL via INTRAVENOUS

## 2019-09-30 MED ORDER — NITROGLYCERIN 0.4 MG SL SUBL
0.8000 mg | SUBLINGUAL_TABLET | Freq: Once | SUBLINGUAL | Status: AC
Start: 2019-09-30 — End: 2019-09-30
  Administered 2019-09-30: 0.8 mg via SUBLINGUAL

## 2019-09-30 MED ORDER — NITROGLYCERIN 0.4 MG SL SUBL
SUBLINGUAL_TABLET | SUBLINGUAL | Status: AC
  Filled 2019-09-30: qty 2

## 2019-10-01 ENCOUNTER — Ambulatory Visit (HOSPITAL_COMMUNITY)
Admission: RE | Admit: 2019-10-01 | Discharge: 2019-10-01 | Disposition: A | Discharge status: Home / Self Care | Payer: Medicare Other | Source: Ambulatory Visit | Attending: Cardiology | Admitting: Cardiology

## 2019-10-01 DIAGNOSIS — R072 Precordial pain: Secondary | ICD-10-CM | POA: Diagnosis not present

## 2019-10-01 DIAGNOSIS — R931 Abnormal findings on diagnostic imaging of heart and coronary circulation: Secondary | ICD-10-CM | POA: Diagnosis not present

## 2019-10-02 ENCOUNTER — Telehealth: Payer: Self-pay | Admitting: Cardiology

## 2019-10-02 NOTE — Telephone Encounter (Signed)
The patient has been notified of the result and verbalized understanding.  All questions (if any) were answered. Antonieta Iba, RN 10/02/2019 4:07 PM

## 2019-10-02 NOTE — Telephone Encounter (Signed)
New Message   Patient called during Epic downtime to discuss CT results. Please call.

## 2019-10-07 DIAGNOSIS — R072 Precordial pain: Secondary | ICD-10-CM | POA: Diagnosis not present

## 2019-10-07 DIAGNOSIS — R931 Abnormal findings on diagnostic imaging of heart and coronary circulation: Secondary | ICD-10-CM | POA: Diagnosis not present

## 2019-10-07 DIAGNOSIS — Z6831 Body mass index (BMI) 31.0-31.9, adult: Secondary | ICD-10-CM | POA: Diagnosis not present

## 2019-10-08 ENCOUNTER — Telehealth: Payer: Self-pay | Admitting: Cardiology

## 2019-10-08 NOTE — Telephone Encounter (Signed)
New message   Patient is returning call about ct scan results. Please call.

## 2019-10-08 NOTE — Telephone Encounter (Signed)
Percival Spanish can you call this patient to see what questions he has.  It looks like he has not gotten back to return your call after you left a message about FFR results

## 2019-10-09 MED ORDER — METOPROLOL SUCCINATE ER 25 MG PO TB24: 25.0000 mg | ORAL_TABLET | Freq: Every day | ORAL | 3 refills | Status: DC

## 2019-10-09 NOTE — Telephone Encounter (Signed)
Carl Margarita, MD  Maury Dus, MD; Antonieta Iba, RN  CT FFR shows some mild reduction in blood flow in the distal LAD -likely related to distal tapering of the vessel. His mid LAD lesion looks visually < 50% stenotic. He is already on max dose amlodipine. Start Toprol XL 25mg  daily and followup with virtual visit with me in 2-3 weeks and if still having CP may need cath.  Rx for Toprol 25 mg has been sent in. Patient scheduled for FU visit with Dr. Radford Pax 06/30

## 2019-10-09 NOTE — Telephone Encounter (Signed)
Left message for patient to call back  

## 2019-10-09 NOTE — Telephone Encounter (Signed)
Follow up ° ° °Patient is returning call. Please call. °

## 2019-10-29 DIAGNOSIS — I25119 Atherosclerotic heart disease of native coronary artery with unspecified angina pectoris: Secondary | ICD-10-CM | POA: Diagnosis not present

## 2019-10-29 DIAGNOSIS — E1165 Type 2 diabetes mellitus with hyperglycemia: Secondary | ICD-10-CM | POA: Diagnosis not present

## 2019-10-29 DIAGNOSIS — N183 Chronic kidney disease, stage 3 unspecified: Secondary | ICD-10-CM | POA: Diagnosis not present

## 2019-10-29 DIAGNOSIS — E7800 Pure hypercholesterolemia, unspecified: Secondary | ICD-10-CM | POA: Diagnosis not present

## 2019-10-29 DIAGNOSIS — E1129 Type 2 diabetes mellitus with other diabetic kidney complication: Secondary | ICD-10-CM | POA: Diagnosis not present

## 2019-10-29 DIAGNOSIS — I10 Essential (primary) hypertension: Secondary | ICD-10-CM | POA: Diagnosis not present

## 2019-11-05 ENCOUNTER — Telehealth (INDEPENDENT_AMBULATORY_CARE_PROVIDER_SITE_OTHER): Payer: Medicare Other | Admitting: Cardiology

## 2019-11-05 ENCOUNTER — Other Ambulatory Visit: Payer: Self-pay

## 2019-11-05 VITALS — BP 122/56 | HR 54 | Ht 70.0 in | Wt 210.0 lb

## 2019-11-05 DIAGNOSIS — R001 Bradycardia, unspecified: Secondary | ICD-10-CM

## 2019-11-05 DIAGNOSIS — I2583 Coronary atherosclerosis due to lipid rich plaque: Secondary | ICD-10-CM | POA: Diagnosis not present

## 2019-11-05 DIAGNOSIS — I10 Essential (primary) hypertension: Secondary | ICD-10-CM | POA: Diagnosis not present

## 2019-11-05 DIAGNOSIS — E7800 Pure hypercholesterolemia, unspecified: Secondary | ICD-10-CM | POA: Diagnosis not present

## 2019-11-05 DIAGNOSIS — I2510 Atherosclerotic heart disease of native coronary artery without angina pectoris: Secondary | ICD-10-CM

## 2019-11-05 MED ORDER — METOPROLOL SUCCINATE ER 25 MG PO TB24: 12.5000 mg | ORAL_TABLET | Freq: Every day | ORAL | 3 refills | Status: DC

## 2019-11-05 NOTE — Addendum Note (Signed)
Addended by: Harland German A on: 11/05/2019 10:20 AM   Modules accepted: Orders

## 2019-11-05 NOTE — Progress Notes (Signed)
Virtual Visit via Telephone Note   This visit type was conducted due to national recommendations for restrictions regarding the COVID-19 Pandemic (e.g. social distancing) in an effort to limit this patient's exposure and mitigate transmission in our community.  Due to his co-morbid illnesses, this patient is at least at moderate risk for complications without adequate follow up.  This format is felt to be most appropriate for this patient at this time.  The patient did not have access to video technology/had technical difficulties with video requiring transitioning to audio format only (telephone).  All issues noted in this document were discussed and addressed.  No physical exam could be performed with this format.  Please refer to the patient's chart for his  consent to telehealth for [facility].   Evaluation Performed:  Follow-up visit  This visit type was conducted due to national recommendations for restrictions regarding the COVID-19 Pandemic (e.g. social distancing).  This format is felt to be most appropriate for this patient at this time.  All issues noted in this document were discussed and addressed.  No physical exam was performed (except for noted visual exam findings with Video Visits).  Please refer to the patient's chart (MyChart message for video visits and phone note for telephone visits) for the patient's consent to telehealth for [facility].  Date:  11/05/2019   ID:  Carl Knapp, Carl Knapp 1945-07-04, MRN [id number]  Patient Location:  Home  Provider location:   [place]  PCP:  Maury Dus, MD  Cardiologist:  Fransico Him, MD  Electrophysiologist:  None   Chief Complaint:  CAD,HTN, OSA  History of Present Illness:    Carl Knapp is a 74 y.o. male who presents via audio/video conferencing for a telehealth visit today.    Carl Knapp is a 74 y.o. male with a hx of severe OSA with AHI 37/hr and now on CPAP at 10cm H2O. He was seen for follow up of his OSA a few  weeks ago and complained of chest pain that was a tightness with radiation to his left shoulder and shoulder blades with N/diaphoresis.  He underwent coronary CTA which showed a calcium score of 0 but 25-49% noncalcified plaque in the mid RCA and mid LAD.  Medical management was recommended at that time.  He has been on BB, amlodipine, statin and ASA and is now back to see how he is doing.  CT FFR showed a possible flow limiting lesion in the mid to distal LAD but felt likely related to distal tapering of the vessel and not from hemodynamically flow limiting lesion.    He is here today for followup and is doing well.  He has not had any further anginal symptoms but is feeling tired on the BB and cannot get his heart rate up to what he thinks it should for him to feel ok when dancing and hiking.  He is having fatigue on the Toprol.   He denies any chest pain or pressure, SOB, DOE, PND, orthopnea, LE edema, dizziness, palpitations or syncope. He is compliant with his meds and is tolerating meds with no SE.    The patient does not have symptoms concerning for COVID-19 infection (fever, chills, cough, or new shortness of breath).  Prior CV studies:   The following studies were reviewed today:  Coronary CTA  Past Medical History:  Diagnosis Date  . Coronary artery disease 12/2013   20-30% RCA by cath- states was good.  . Diabetes mellitus without  complication ([place])    type II  . DVT (deep venous thrombosis) ([place])    bilateral legs-greater on left. -3-5 years ago-tx. coumadin x 1 year.  Marland Kitchen Hx of cardiovascular stress test    ETT-Myoview (7/15):  ECG with ST depression; freq PVCs, + chest pain; normal perfusion  . Hyperlipidemia    LDL goal<100  . Hypertension   . Macular degeneration    mild  . OSA (obstructive sleep apnea)    Severe w AHI 37/hr now on CPAP at 10cm H2O   Past Surgical History:  Procedure Laterality Date  . APPENDECTOMY     open '68  . HERNIA REPAIR Bilateral    inguinal  hernia repair '80  , umbilical repair Dr. Lucia Gaskins 06-14-17  . INSERTION OF MESH N/A 06/14/2017   Procedure: INSERTION OF MESH;  Surgeon: Alphonsa Overall, MD;  Location: [facility];  Service: General;  Laterality: N/A;  . LEFT HEART CATHETERIZATION WITH CORONARY ANGIOGRAM N/A 12/09/2013   Procedure: LEFT HEART CATHETERIZATION WITH CORONARY ANGIOGRAM;  Surgeon: Jettie Booze, MD;  Location: [facility] CATH LAB;  Service: Cardiovascular;  Laterality: N/A;  . UMBILICAL HERNIA REPAIR N/A 06/14/2017   Procedure: LAPAROSCOPIC UMBILICAL HERNIA REPAIR WITH MESH ERAS PATHWAY;  Surgeon: Alphonsa Overall, MD;  Location: [facility]S;  Service: General;  Laterality: N/A;  ERAS PATHWAY  . VASECTOMY       Current Meds  Medication Sig  . acetaminophen (TYLENOL) 500 MG tablet Take 1,000 mg by mouth daily as needed for moderate pain or headache.  Marland Kitchen amLODipine (NORVASC) 10 MG tablet Take 10 mg by mouth daily.  Marland Kitchen aspirin EC 81 MG tablet Take 81 mg by mouth daily.  Marland Kitchen KLOR-CON M20 20 MEQ tablet Take 40 mEq by mouth every evening.   Marland Kitchen lisinopril-hydrochlorothiazide (PRINZIDE,ZESTORETIC) 20-12.5 MG per tablet Take 2 tablets by mouth daily.  Marland Kitchen loratadine (CLARITIN) 10 MG tablet Take 10 mg by mouth daily as needed for allergies.  . LUTEIN-ZEAXANTHIN PO Take 1 tablet by mouth daily.  . metFORMIN (GLUCOPHAGE) 1000 MG tablet Take 1,000 mg by mouth 2 (two) times daily with a meal.   . metoprolol succinate (TOPROL XL) 25 MG 24 hr tablet Take 1 tablet (25 mg total) by mouth daily.  . sildenafil (REVATIO) 20 MG tablet Take 40-60 mg by mouth daily as needed (erectile dysfunction).  . simvastatin (ZOCOR) 20 MG tablet Take 20 mg by mouth every morning.      Allergies:   Patient has no known allergies.   Social History   Tobacco Use  . Smoking status: Former Smoker    Types: Cigarettes    Quit date: 06/08/2010    Years since quitting: 9.4  . Smokeless tobacco: Never Used  Vaping Use  . Vaping Use: Never used  Substance Use Topics  . Alcohol  use: Yes    Comment: rare beer or glass of wine  . Drug use: No     Family Hx: The patient's family history includes AAA (abdominal aortic aneurysm) in his father; CAD in his father; Heart attack in his father; Hypertension in an other family member.  ROS:   Please see the history of present illness.     All other systems reviewed and are negative.   Labs/Other Tests and Data Reviewed:    Recent Labs: 09/29/2019: BUN 24; Creatinine, Ser 1.23; Potassium 4.4; Sodium 143   Recent Lipid Panel No results found for: CHOL, TRIG, HDL, CHOLHDL, LDLCALC, LDLDIRECT  Wt Readings from Last 3 Encounters:  11/05/19 210 lb (95.3 kg)  09/16/19 222 lb (100.7 kg)  09/05/18 204 lb (92.5 kg)     Objective:    Vital Signs:  BP (!) 122/56   Pulse (!) 54   Ht 5\' 10"  (1.778 m)   Wt 210 lb (95.3 kg)   BMI 30.13 kg/m     ASSESSMENT & PLAN:    1.  ASCAD/Chest pain -coronary CTA showed a calcium score of 0 but 25-49% noncalcified plaque in the mid RCA and mid LAD.   -CT FFR showed a possible flow limiting lesion in the mid to distal LAD but felt likely related to distal tapering of the vessel and not from hemodynamically flow limiting lesion.   -Medical management was recommended at that time.  -he has not had any further anginal symptoms but is feeling tired on the BB and cannot get his heart rate up to what he thinks it should for him to feel ok when dancing and hiking.  He is having fatigue on the Toprol  -He will continue on amlodipine 10mg  daily, ASA and statin -I have instructed him to cut Toprol to 12.5mg  daily due to exercise intolerance, Bradycardia and fatigue on the BB and I have asked him to call me if he develops angina  2.  HTN -BP controlled -continue amlodipine 10mg  daily and Lisinopril-HCT 20-12.5mg  2 tab daily -decrease Toprol XL to 12.5mg  daily due to fatigue, bradycardia and exercise intolerance -I have asked check BP/HR daily in am and call the results in 1 week  3.   HLD -LDL goal < 70 -continue on Simvastatin 20mg  daily -I will get a copy of last FLP from PCP  4.  Bradycardia -drug induced from BB -see #1 will cut BB back to 12.5mg  daily and see how he does.  COVID-19 Education:  He has received both COVID 19 vaccines  Patient Risk:   After full review of this patient's clinical status, I feel that they are at least moderate risk at this time.  Time:   Today, I have spent 20 minutes on telemedicine discussing medical problems including CAD HTN, HLD and reviewing patient's chart including coronary CTA and FFR.  Medication Adjustments/Labs and Tests Ordered: Current medicines are reviewed at length with the patient today.  Concerns regarding medicines are outlined above.  Tests Ordered: No orders of the defined types were placed in this encounter.  Medication Changes: No orders of the defined types were placed in this encounter.   Disposition:  Follow up in 3 months  Signed, Fransico Him, MD  11/05/2019 10:06 AM    [place]

## 2019-11-05 NOTE — Patient Instructions (Addendum)
Medication Instructions:  1) DECREASE TOPROL to 12.5 mg daily Check your blood pressure and heart rate daily for about a week and contact us with your readings. *If you need a refill on your cardiac medications before your next appointment, please call your pharmacy*  Follow-Up: At [facility], you and your health needs are our priority.  As part of our continuing mission to provide you with exceptional heart care, we have created designated Provider Care Teams.  These Care Teams include your primary Cardiologist (physician) and Advanced Practice Providers (APPs -  Physician Assistants and Nurse Practitioners) who all work together to provide you with the care you need, when you need it. Your next appointment:   3 month(s) The format for your next appointment:   In Person Provider:   You may see Fransico Him, MD or one of the following Advanced Practice Providers on your designated Care Team:    Melina Copa, PA-C  Ermalinda Barrios, PA-C

## 2019-11-20 DIAGNOSIS — N529 Male erectile dysfunction, unspecified: Secondary | ICD-10-CM | POA: Diagnosis not present

## 2019-11-20 DIAGNOSIS — L309 Dermatitis, unspecified: Secondary | ICD-10-CM | POA: Diagnosis not present

## 2019-11-20 DIAGNOSIS — N1831 Chronic kidney disease, stage 3a: Secondary | ICD-10-CM | POA: Diagnosis not present

## 2019-11-20 DIAGNOSIS — I25119 Atherosclerotic heart disease of native coronary artery with unspecified angina pectoris: Secondary | ICD-10-CM | POA: Diagnosis not present

## 2019-11-20 DIAGNOSIS — E1165 Type 2 diabetes mellitus with hyperglycemia: Secondary | ICD-10-CM | POA: Diagnosis not present

## 2019-11-20 DIAGNOSIS — K219 Gastro-esophageal reflux disease without esophagitis: Secondary | ICD-10-CM | POA: Diagnosis not present

## 2019-11-20 DIAGNOSIS — G4733 Obstructive sleep apnea (adult) (pediatric): Secondary | ICD-10-CM | POA: Diagnosis not present

## 2019-11-20 DIAGNOSIS — I10 Essential (primary) hypertension: Secondary | ICD-10-CM | POA: Diagnosis not present

## 2019-11-20 DIAGNOSIS — E7800 Pure hypercholesterolemia, unspecified: Secondary | ICD-10-CM | POA: Diagnosis not present

## 2019-12-03 DIAGNOSIS — M542 Cervicalgia: Secondary | ICD-10-CM | POA: Diagnosis not present

## 2019-12-03 DIAGNOSIS — R202 Paresthesia of skin: Secondary | ICD-10-CM | POA: Diagnosis not present

## 2019-12-05 DIAGNOSIS — E7800 Pure hypercholesterolemia, unspecified: Secondary | ICD-10-CM | POA: Diagnosis not present

## 2019-12-05 DIAGNOSIS — N183 Chronic kidney disease, stage 3 unspecified: Secondary | ICD-10-CM | POA: Diagnosis not present

## 2019-12-05 DIAGNOSIS — I25119 Atherosclerotic heart disease of native coronary artery with unspecified angina pectoris: Secondary | ICD-10-CM | POA: Diagnosis not present

## 2019-12-05 DIAGNOSIS — E1165 Type 2 diabetes mellitus with hyperglycemia: Secondary | ICD-10-CM | POA: Diagnosis not present

## 2019-12-05 DIAGNOSIS — E1129 Type 2 diabetes mellitus with other diabetic kidney complication: Secondary | ICD-10-CM | POA: Diagnosis not present

## 2019-12-05 DIAGNOSIS — I10 Essential (primary) hypertension: Secondary | ICD-10-CM | POA: Diagnosis not present

## 2019-12-10 DIAGNOSIS — Z20822 Contact with and (suspected) exposure to covid-19: Secondary | ICD-10-CM | POA: Diagnosis not present

## 2019-12-25 ENCOUNTER — Encounter (INDEPENDENT_AMBULATORY_CARE_PROVIDER_SITE_OTHER): Payer: Self-pay | Admitting: Otolaryngology

## 2019-12-25 ENCOUNTER — Other Ambulatory Visit: Payer: Self-pay

## 2019-12-25 ENCOUNTER — Ambulatory Visit (INDEPENDENT_AMBULATORY_CARE_PROVIDER_SITE_OTHER): Payer: Medicare Other | Admitting: Otolaryngology

## 2019-12-25 VITALS — Temp 97.7°F

## 2019-12-25 DIAGNOSIS — R202 Paresthesia of skin: Secondary | ICD-10-CM

## 2019-12-25 DIAGNOSIS — I2510 Atherosclerotic heart disease of native coronary artery without angina pectoris: Secondary | ICD-10-CM

## 2019-12-25 DIAGNOSIS — I2583 Coronary atherosclerosis due to lipid rich plaque: Secondary | ICD-10-CM

## 2019-12-25 DIAGNOSIS — G629 Polyneuropathy, unspecified: Secondary | ICD-10-CM | POA: Diagnosis not present

## 2019-12-25 NOTE — Progress Notes (Signed)
HPI: Carl Knapp is a 74 y.o. male who presents is referred by Marilynne Drivers, PA for evaluation of left facial paresthesias.  This initially began about 2 months ago.  He apparently had a broken lower molar tooth that was extracted in May.  When he initially had the symptoms he thought it was related to the dental extraction but saw the doctor who performed the extraction and had x-rays which were normal.  He complains of numbness on the left side of his face that varies in intensity.  He has also developed a twitch of the muscles lower in the neck on the left side that also varies in intensity.  He also complains of some pain tightness and discomfort in the back of the left side of the neck.  The degree of numbness varies to the degree that sometimes he feels like his face is swollen although it never appears swollen.  He feels like he has received an injection of Novocain.  He has had no weakness of his face.  No change in his hearing although he does wear bilateral hearing aids. Patient does have history of obstructive sleep apnea and wears CPAP at night. He quit smoking about 10 years ago.  Past Medical History:  Diagnosis Date  . Coronary artery disease 12/2013   20-30% RCA by cath- states was good.  . Diabetes mellitus without complication ([place])    type II  . DVT (deep venous thrombosis) ([place])    bilateral legs-greater on left. -3-5 years ago-tx. coumadin x 1 year.  Marland Kitchen Hx of cardiovascular stress test    ETT-Myoview (7/15):  ECG with ST depression; freq PVCs, + chest pain; normal perfusion  . Hyperlipidemia    LDL goal<100  . Hypertension   . Macular degeneration    mild  . OSA (obstructive sleep apnea)    Severe w AHI 37/hr now on CPAP at 10cm H2O   Past Surgical History:  Procedure Laterality Date  . APPENDECTOMY     open '68  . HERNIA REPAIR Bilateral    inguinal hernia repair '80  , umbilical repair Dr. Lucia Gaskins 06-14-17  . INSERTION OF MESH N/A 06/14/2017   Procedure: INSERTION OF  MESH;  Surgeon: Alphonsa Overall, MD;  Location: [facility];  Service: General;  Laterality: N/A;  . LEFT HEART CATHETERIZATION WITH CORONARY ANGIOGRAM N/A 12/09/2013   Procedure: LEFT HEART CATHETERIZATION WITH CORONARY ANGIOGRAM;  Surgeon: Jettie Booze, MD;  Location[facility] CATH LAB;  Service: Cardiovascular;  Laterality: N/A;  . UMBILICAL HERNIA REPAIR N/A 06/14/2017   Procedure: LAPAROSCOPIC UMBILICAL HERNIA REPAIR WITH MESH ERAS PATHWAY;  Surgeon: Alphonsa Overall, MD;  Location: [facility];  Service: General;  Laterality: N/A;  ERAS PATHWAY  . VASECTOMY     Social History   Socioeconomic History  . Marital status: Married    Spouse name: Not on file  . Number of children: Not on file  . Years of education: Not on file  . Highest education level: Not on file  Occupational History  . Not on file  Tobacco Use  . Smoking status: Former Smoker    Packs/day: 0.50    Years: 20.00    Pack years: 10.00    Types: Cigarettes    Quit date: 06/08/2010    Years since quitting: 9.5  . Smokeless tobacco: Never Used  Vaping Use  . Vaping Use: Never used  Substance and Sexual Activity  . Alcohol use: Yes    Comment: rare beer or glass  of wine  . Drug use: No  . Sexual activity: Yes  Other Topics Concern  . Not on file  Social History Narrative  . Not on file   Social Determinants of Health   Financial Resource Strain:   . Difficulty of Paying Living Expenses: Not on file  Food Insecurity:   . Worried About Charity fundraiser in the Last Year: Not on file  . Ran Out of Food in the Last Year: Not on file  Transportation Needs:   . Lack of Transportation (Medical): Not on file  . Lack of Transportation (Non-Medical): Not on file  Physical Activity:   . Days of Exercise per Week: Not on file  . Minutes of Exercise per Session: Not on file  Stress:   . Feeling of Stress : Not on file  Social Connections:   . Frequency of Communication with Friends and Family: Not on file  . Frequency of Social  Gatherings with Friends and Family: Not on file  . Attends Religious Services: Not on file  . Active Member of Clubs or Organizations: Not on file  . Attends Archivist Meetings: Not on file  . Marital Status: Not on file   Family History  Problem Relation Age of Onset  . CAD Father   . AAA (abdominal aortic aneurysm) Father   . Heart attack Father   . Hypertension Other    No Known Allergies Prior to Admission medications   Medication Sig Start Date End Date Taking? Authorizing Provider  acetaminophen (TYLENOL) 500 MG tablet Take 1,000 mg by mouth daily as needed for moderate pain or headache.   Yes [provider]  amLODipine (NORVASC) 10 MG tablet Take 10 mg by mouth daily.   Yes [provider]  aspirin EC 81 MG tablet Take 81 mg by mouth daily.   Yes [provider]  KLOR-CON M20 20 MEQ tablet Take 40 mEq by mouth every evening.  11/10/15  Yes [provider]  lisinopril-hydrochlorothiazide (PRINZIDE,ZESTORETIC) 20-12.5 MG per tablet Take 2 tablets by mouth daily.   Yes [provider]  loratadine (CLARITIN) 10 MG tablet Take 10 mg by mouth daily as needed for allergies.   Yes [provider]  LUTEIN-ZEAXANTHIN PO Take 1 tablet by mouth daily.   Yes [provider]  metFORMIN (GLUCOPHAGE) 1000 MG tablet Take 1,000 mg by mouth 2 (two) times daily with a meal.  05/04/15  Yes [provider]  metoprolol succinate (TOPROL XL) 25 MG 24 hr tablet Take 0.5 tablets (12.5 mg total) by mouth daily. 11/05/19 10/30/20 Yes Turner, Eber Hong, MD  sildenafil (REVATIO) 20 MG tablet Take 40-60 mg by mouth daily as needed (erectile dysfunction).   Yes [provider]  simvastatin (ZOCOR) 20 MG tablet Take 20 mg by mouth every morning.    Yes [provider]     Positive ROS: Otherwise negative  All other systems have been reviewed and were otherwise negative with the exception of those mentioned in the  HPI and as above.  Physical Exam: Constitutional: Alert, well-appearing, no acute distress.  No hoarseness. Ears: External ears without lesions or tenderness. Ear canals are clear bilaterally with intact, clear TMs Bilaterally. Nasal: External nose without lesions. Septum slightly deviated to the right.. Clear nasal passages bilaterally. Oral: Lips and gums without lesions. Tongue and palate mucosa without lesions. Posterior oropharynx clear.  Tongue protrudes midline. Fiberoptic laryngoscopy was performed through the left nostril.  Nasal cavity is clear.  Nasopharynx is clear.  The base of tongue vallecula and epiglottis were normal.  Vocal cords were clear bilaterally with normal vocal cord mobility bilaterally. Neck: No palpable adenopathy or masses.  No palpable adenopathy in the neck.  Normal facial nerve function bilaterally.  No enlargement of the parotid gland and no palpable parotid masses noted. Respiratory: Breathing comfortably  Skin: No facial/neck lesions or rash noted.  On pinprick testing the region of numbness seems to involve the left cheek and left side of the face.  The forehead region was uninvolved as this was symmetric as was the lips.  In the office today patient does have some involuntarily spasm of the left neck platysma or SCM.  Laryngoscopy  Date/Time: 12/25/2019 3:29 PM Performed by: Rozetta Nunnery, MD Authorized by: Rozetta Nunnery, MD   Consent:    Consent obtained:  Verbal   Consent given by:  Patient Procedure details:    Indications: direct visualization of the upper aerodigestive tract     Medication:  Afrin   Instrument: flexible fiberoptic laryngoscope     Scope location: left nare   Sinus:    Left nasopharynx: normal   Mouth:    Oropharynx: normal     Vallecula: normal     Epiglottis: normal   Throat:    True vocal cords: normal   Comments:     On fiberoptic laryngoscopy the nasopharynx hypopharynx and larynx were normal to  examination with normal vocal cord mobility.    Assessment: On clinical examination patient has no evidence of infection or neoplasm.  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  Plan: Discussed the patient with Dr. Alyson Ingles and recommended referral to neurology for further evaluation.   Radene Journey, MD   CC:

## 2019-12-26 ENCOUNTER — Other Ambulatory Visit: Payer: Self-pay | Admitting: Family Medicine

## 2019-12-26 DIAGNOSIS — R202 Paresthesia of skin: Secondary | ICD-10-CM

## 2019-12-26 DIAGNOSIS — M542 Cervicalgia: Secondary | ICD-10-CM

## 2020-01-14 ENCOUNTER — Ambulatory Visit
Admission: RE | Admit: 2020-01-14 | Discharge: 2020-01-14 | Disposition: A | Discharge status: Home / Self Care | Payer: Medicare Other | Source: Ambulatory Visit | Attending: Family Medicine | Admitting: Family Medicine

## 2020-01-14 ENCOUNTER — Other Ambulatory Visit: Payer: Self-pay

## 2020-01-14 DIAGNOSIS — R202 Paresthesia of skin: Secondary | ICD-10-CM

## 2020-01-14 DIAGNOSIS — M542 Cervicalgia: Secondary | ICD-10-CM

## 2020-01-14 DIAGNOSIS — M4802 Spinal stenosis, cervical region: Secondary | ICD-10-CM | POA: Diagnosis not present

## 2020-01-14 IMAGING — MR MR CERVICAL SPINE W/O CM
4 of 5 series · 27 of 48 positions shown · non-contrast
Comparison: No pertinent prior exams are available for comparison.

CLINICAL DATA: Neck pain. Paresthesias. Additional history provided
by scanning technologist: Patient reports left-sided neck twitching
and numbness in left cheek, symptoms began [DATE].

EXAM:
MRI CERVICAL SPINE WITHOUT CONTRAST
TECHNIQUE: Multiplanar, multisequence MR imaging of the cervical spine was
performed. No intravenous contrast was administered.

[Series 3: T2 · sagittal · 3.0mm · 0.66mm/px · 6 of 16 slices shown (1 of 2)]
[im 1/16]
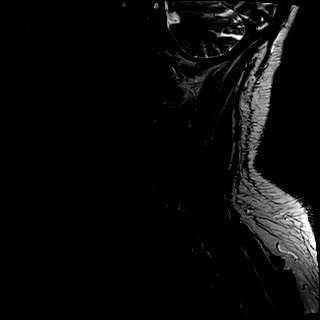
[im 4/16]
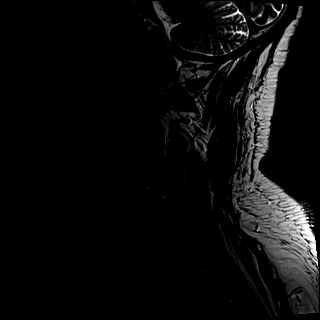
[im 7/16]
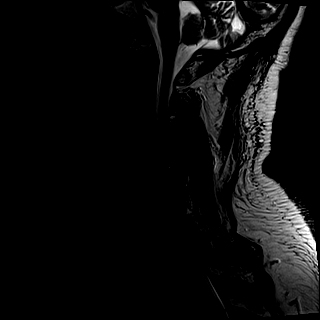
[im 10/16]
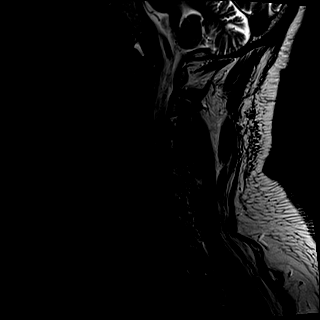
[im 13/16]
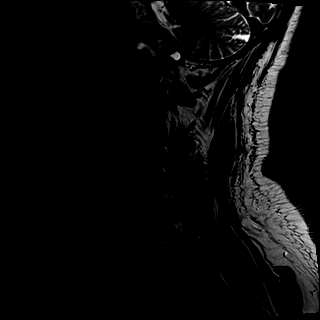
[im 16/16]
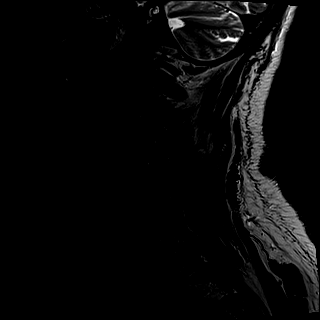

[Series 4: T1 · sagittal · 3.0mm · 0.41mm/px · 7 of 16 slices shown]
[im 1/16]
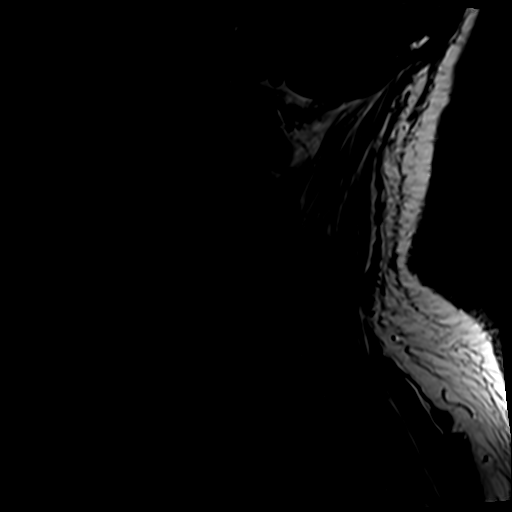
[im 3/16]
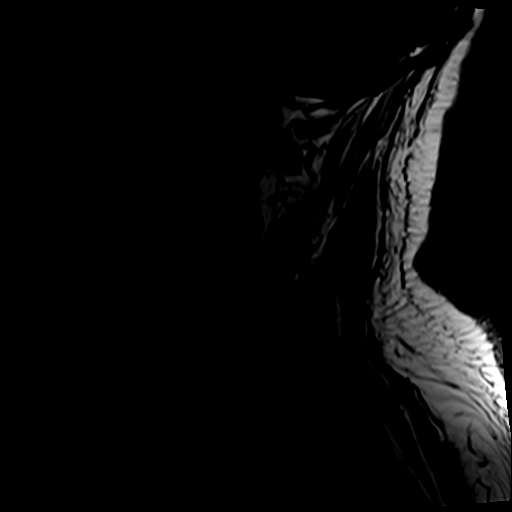
[im 6/16]
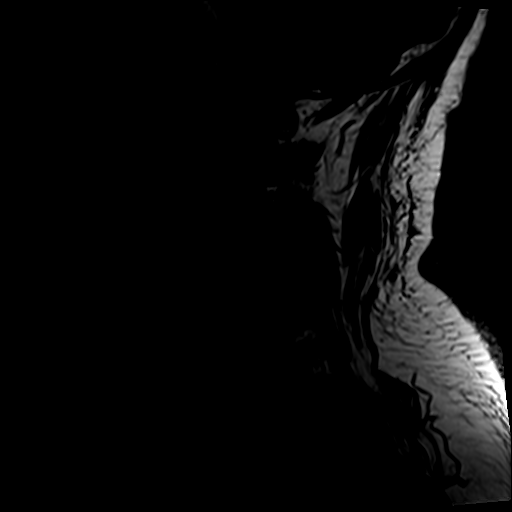
[im 8/16]
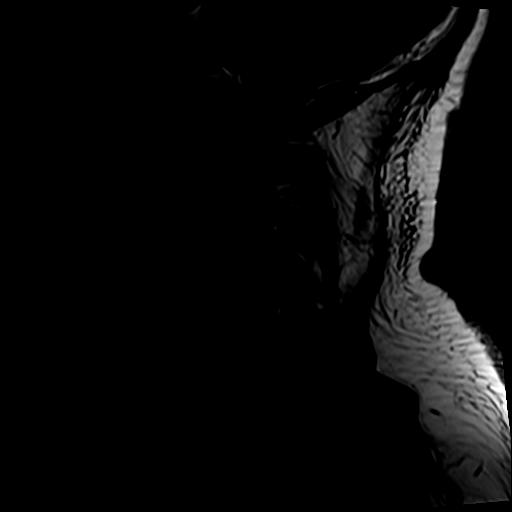
[im 11/16]
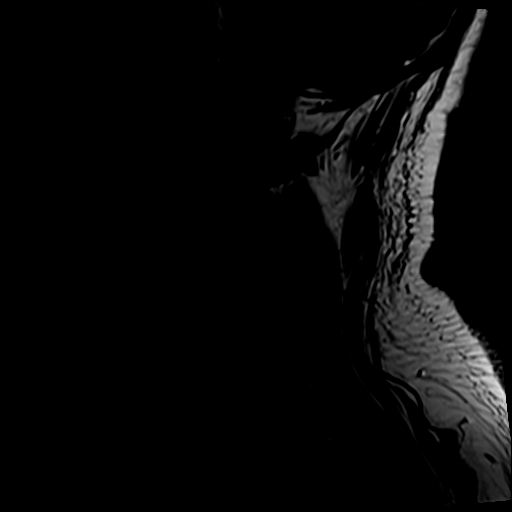
[im 13/16]
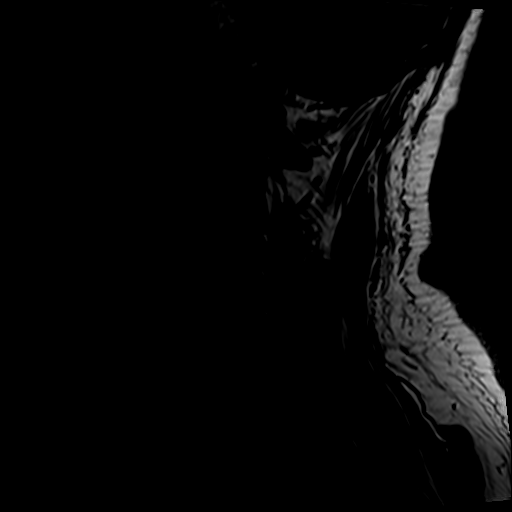
[im 16/16]
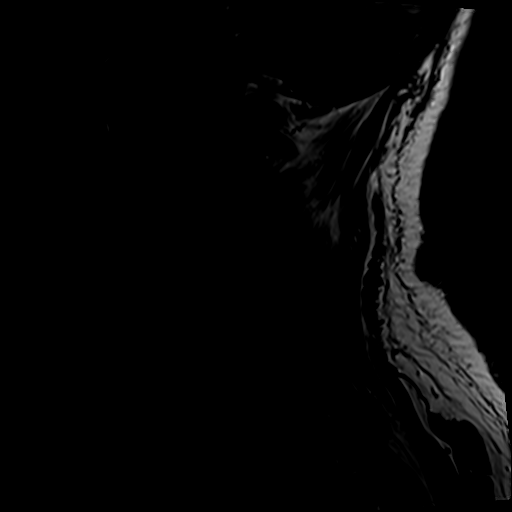

[Series 5: tir sag · sagittal · 3.0mm · 0.41mm/px · 6 of 16 slices shown]
[im 1/16]
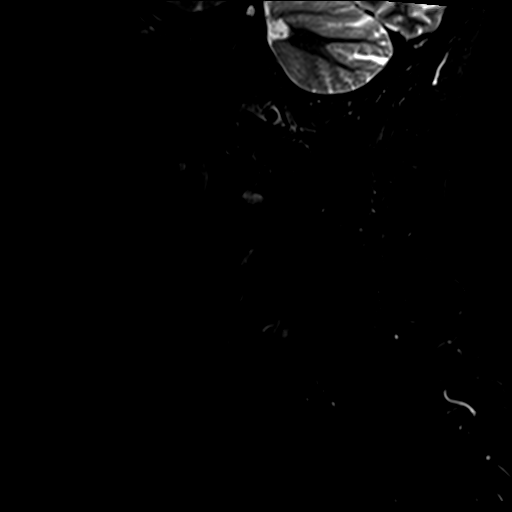
[im 3/16]
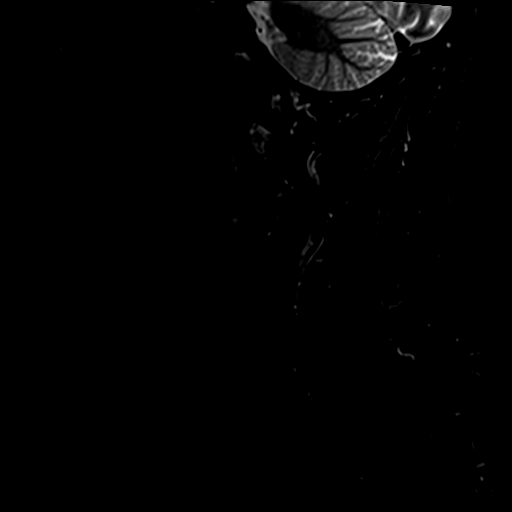
[im 6/16]
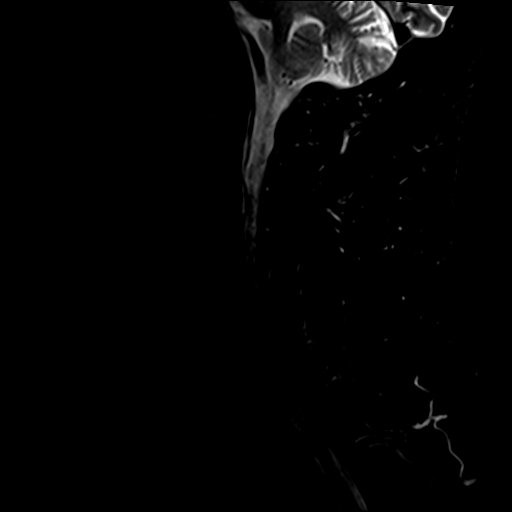
[im 8/16]
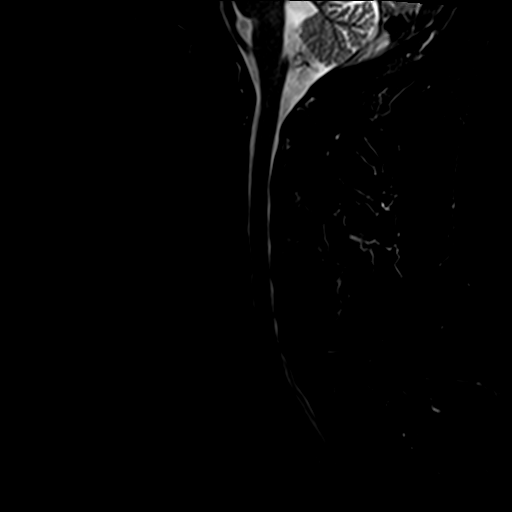
[im 11/16]
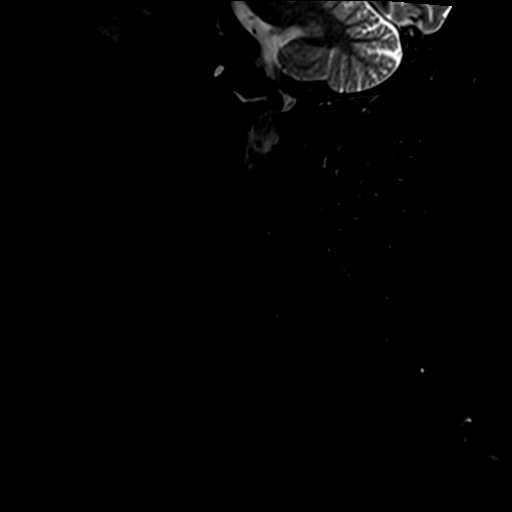
[im 13/16]
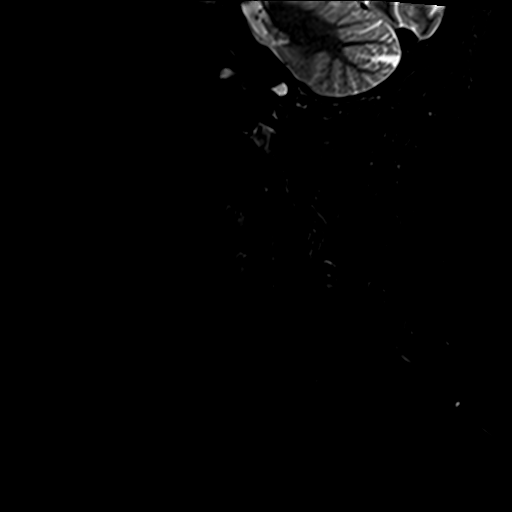

[Series 7: T2 · axial · 3.0mm · 0.70mm/px · z∈[-96,+14]mm · 8 of 31 slices shown (2 of 2)]
[im 1/31]
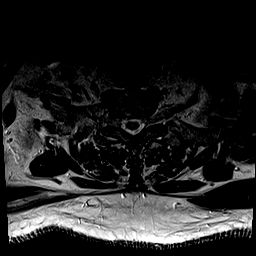
[im 5/31]
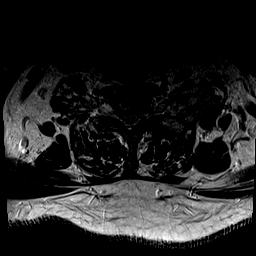
[im 10/31]
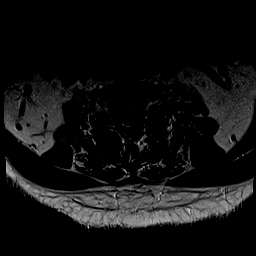
[im 14/31]
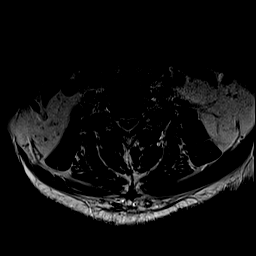
[im 17/31]
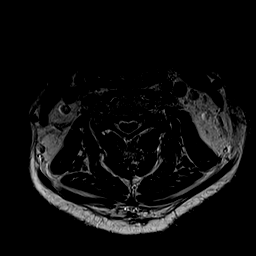
[im 21/31]
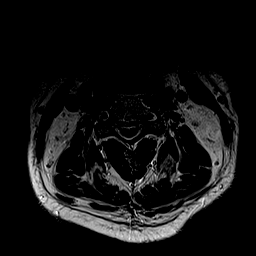
[im 26/31]
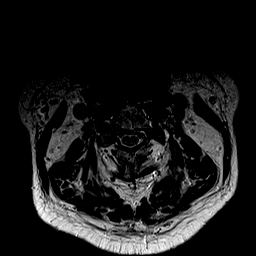
[im 31/31]
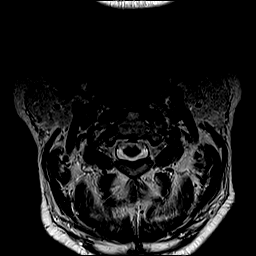

[27 of 48 positions shown; findings below may reference images not displayed]

FINDINGS: Alignment: Straightening of the expected cervical lordosis. No
significant spondylolisthesis.

Vertebrae: Vertebral body height is maintained. No suspicious
osseous lesion or significant marrow edema.

Cord: No spinal cord signal abnormality.

Posterior Fossa, vertebral arteries, paraspinal tissues: No
abnormality identified within included portions of the posterior
fossa. Flow voids preserved within the imaged cervical vertebral
arteries. Paraspinal soft tissues within normal limits.

Disc levels:

Mild multilevel disc degeneration.

Borderline mild congenital narrowing of the cervical spinal canal.

There is a small atlantooccipital joint effusion on the right (for
instance as seen on series 5, image 4).

C2-C3: Small central disc protrusion. Mild uncinate hypertrophy on
the right. No significant spinal canal or foraminal stenosis.

C3-C4: No significant disc herniation or stenosis.

C4-C5: Small central disc protrusion. Minimal uncovertebral and
facet hypertrophy. Mild spinal canal narrowing with the disc
protrusion contacting the ventral spinal cord. No significant
foraminal stenosis.

C5-C6: Shallow disc bulge. Uncovertebral, facet and ligamentum
flavum hypertrophy. Mild relative spinal canal narrowing. Mild
relative left neural foraminal narrowing.

C6-C7: Shallow disc bulge. Superimposed small right foraminal disc
protrusion (series 7, image 22). Uncovertebral, facet and ligamentum
flavum hypertrophy. Moderate spinal canal stenosis with contact upon
the dorsal and ventral spinal cord. Moderate/severe right neural
foraminal narrowing. The disc protrusion could affect the exiting
right C7 nerve root. Mild left neural foraminal narrowing.

C7-T1: Facet and ligamentum flavum hypertrophy. No significant disc
herniation, spinal canal stenosis or neural foraminal narrowing.
IMPRESSION: Cervical spondylosis superimposed upon borderline mild congenital
narrowing of the cervical spinal canal. Findings are most notably as
follows.

At C6-C7, there is a shallow disc bulge. Superimposed small right
foraminal disc protrusion. Uncovertebral, facet and ligamentum
flavum hypertrophy. Moderate spinal canal stenosis with contact upon
the ventral and dorsal spinal cord. The disc protrusion contributes
to moderate/severe right neural foraminal narrowing and could affect
the exiting right C7 nerve root. Mild left neural foraminal
narrowing.

At C4-C5, a central disc protrusion contributes to mild spinal canal
narrowing and contacts the ventral spinal cord.

No more than mild spinal canal or neural foraminal narrowing at the
remaining levels.

Small right atlantooccipital joint effusion.

## 2020-01-16 ENCOUNTER — Other Ambulatory Visit: Payer: Medicare Other

## 2020-02-03 DIAGNOSIS — N183 Chronic kidney disease, stage 3 unspecified: Secondary | ICD-10-CM | POA: Diagnosis not present

## 2020-02-03 DIAGNOSIS — I10 Essential (primary) hypertension: Secondary | ICD-10-CM | POA: Diagnosis not present

## 2020-02-03 DIAGNOSIS — E7800 Pure hypercholesterolemia, unspecified: Secondary | ICD-10-CM | POA: Diagnosis not present

## 2020-02-03 DIAGNOSIS — E1129 Type 2 diabetes mellitus with other diabetic kidney complication: Secondary | ICD-10-CM | POA: Diagnosis not present

## 2020-02-03 DIAGNOSIS — I25119 Atherosclerotic heart disease of native coronary artery with unspecified angina pectoris: Secondary | ICD-10-CM | POA: Diagnosis not present

## 2020-02-03 DIAGNOSIS — E1165 Type 2 diabetes mellitus with hyperglycemia: Secondary | ICD-10-CM | POA: Diagnosis not present

## 2020-02-08 NOTE — Progress Notes (Addendum)
Date:  02/09/2020   ID:  Carl Knapp, DOB 1946-04-04, MRN [id number]  Patient Location:  Home  Provider location:   [place]  PCP:  Maury Dus, MD  Cardiologist:  Fransico Him, MD  Electrophysiologist:  None   Chief Complaint:  CAD,HTN, OSA  History of Present Illness:    Carl Knapp is a 74 y.o. male with a hx of severe OSA with AHI 37/hr and now on CPAP at 10cm H2O. He was seen for follow up of his OSA a few months ago and complained of chest pain that was a tightness with radiation to his left shoulder and shoulder blades with N/diaphoresis.  He underwent coronary CTA which showed a calcium score of 0 but 25-49% noncalcified plaque in the mid RCA and mid LAD.  Medical management was recommended at that time.  CT FFR showed a possible flow limiting lesion in the mid to distal LAD but felt likely related to distal tapering of the vessel and not from hemodynamically flow limiting lesion.    He is here today for followup and is doing well.  He tells me that on the left side of his tongue, neck and jaw he will get numbness which is intermittent and then will have a muscle in his neck that gets a tic in it and then has a pain in his neck.  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  It is positional.  He has not had any further anginal symptoms.  He says that his HR has improved on lower dose of BB and is able to hike with no problems now. He denies any chest pain or pressure, SOB, DOE, PND, orthopnea, dizziness, palpitations or syncope. Occasionally he will have some swelling in his feet.   He is compliant with his meds and is tolerating meds with no SE.    The patient does not have symptoms concerning for COVID-19 infection (fever, chills, cough, or new shortness of breath).  Prior CV studies:   The following studies were reviewed today:  Coronary CTA  Past Medical History:  Diagnosis Date  . Coronary artery disease 12/2013   20-30% RCA by cath- states was good.    . Diabetes mellitus without complication ([place])    type II  . DVT (deep venous thrombosis) ([place])    bilateral legs-greater on left. -3-5 years ago-tx. coumadin x 1 year.  Marland Kitchen Hx of cardiovascular stress test    ETT-Myoview (7/15):  ECG with ST depression; freq PVCs, + chest pain; normal perfusion  . Hyperlipidemia    LDL goal<100  . Hypertension   . Macular degeneration    mild  . OSA (obstructive sleep apnea)    Severe w AHI 37/hr now on CPAP at 10cm H2O   Past Surgical History:  Procedure Laterality Date  . APPENDECTOMY     open '68  . HERNIA REPAIR Bilateral    inguinal hernia repair '80  , umbilical repair Dr. Lucia Gaskins 06-14-17  . INSERTION OF MESH N/A 06/14/2017   Procedure: INSERTION OF MESH;  Surgeon: Alphonsa Overall, MD;  Location: [facility];  Service: General;  Laterality: N/A;  . LEFT HEART CATHETERIZATION WITH CORONARY ANGIOGRAM N/A 12/09/2013   Procedure: LEFT HEART CATHETERIZATION WITH CORONARY ANGIOGRAM;  Surgeon: Jettie Booze, MD;  Location: [facility] CATH LAB;  Service: Cardiovascular;  Laterality: N/A;  . UMBILICAL HERNIA REPAIR N/A 06/14/2017   Procedure: LAPAROSCOPIC UMBILICAL HERNIA REPAIR WITH MESH ERAS PATHWAY;  Surgeon: Lucia Gaskins,  Shanon Brow, MD;  Location: [facility];  Service: General;  Laterality: N/A;  ERAS PATHWAY  . VASECTOMY       Current Meds  Medication Sig  . acetaminophen (TYLENOL) 500 MG tablet Take 1,000 mg by mouth daily as needed for moderate pain or headache.  Marland Kitchen amLODipine (NORVASC) 10 MG tablet Take 10 mg by mouth daily.  Marland Kitchen aspirin EC 81 MG tablet Take 81 mg by mouth daily.  Marland Kitchen KLOR-CON M20 20 MEQ tablet Take 40 mEq by mouth every evening.   Marland Kitchen lisinopril-hydrochlorothiazide (PRINZIDE,ZESTORETIC) 20-12.5 MG per tablet Take 2 tablets by mouth daily.  Marland Kitchen loratadine (CLARITIN) 10 MG tablet Take 10 mg by mouth daily as needed for allergies.  . LUTEIN-ZEAXANTHIN PO Take 1 tablet by mouth daily.  . metFORMIN (GLUCOPHAGE) 1000 MG tablet Take 1,000 mg by mouth 2 (two) times  daily with a meal.   . metoprolol succinate (TOPROL XL) 25 MG 24 hr tablet Take 0.5 tablets (12.5 mg total) by mouth daily.  . pioglitazone (ACTOS) 15 MG tablet Take 15 mg by mouth as directed.  . sildenafil (REVATIO) 20 MG tablet Take 40-60 mg by mouth daily as needed (erectile dysfunction).  . simvastatin (ZOCOR) 20 MG tablet Take 20 mg by mouth every morning.      Allergies:   Patient has no known allergies.   Social History   Tobacco Use  . Smoking status: Former Smoker    Packs/day: 0.50    Years: 20.00    Pack years: 10.00    Types: Cigarettes    Quit date: 06/08/2010    Years since quitting: 9.6  . Smokeless tobacco: Never Used  Vaping Use  . Vaping Use: Never used  Substance Use Topics  . Alcohol use: Yes    Comment: rare beer or glass of wine  . Drug use: No     Family Hx: The patient's family history includes AAA (abdominal aortic aneurysm) in his father; CAD in his father; Heart attack in his father; Hypertension in an other family member.  ROS:   Please see the history of present illness.     All other systems reviewed and are negative.   Labs/Other Tests and Data Reviewed:    Recent Labs: 09/29/2019: BUN 24; Creatinine, Ser 1.23; Potassium 4.4; Sodium 143   Recent Lipid Panel No results found for: CHOL, TRIG, HDL, CHOLHDL, LDLCALC, LDLDIRECT  Wt Readings from Last 3 Encounters:  02/09/20 224 lb 12.8 oz (102 kg)  11/05/19 210 lb (95.3 kg)  09/16/19 222 lb (100.7 kg)     Objective:    Vital Signs:  BP 132/68   Pulse (!) 57   Ht 5\' 10"  (1.778 m)   Wt 224 lb 12.8 oz (102 kg)   SpO2 95%   BMI 32.26 kg/m     ASSESSMENT & PLAN:    1.  ASCAD/Chest pain -coronary CTA showed a calcium score of 0 but 25-49% noncalcified plaque in the mid RCA and mid LAD.   -CT FFR showed a possible flow limiting lesion in the mid to distal LAD but felt likely related to distal tapering of the vessel and not from hemodynamically flow limiting lesion.   -Medical  management was recommended at that time.  -he has not had any further anginal symptoms  -He will continue on amlodipine 10mg  daily, ASA, low dose BB and statin  2.  HTN -BP controlled -continue amlodipine 10mg  daily, Toprol XL 12.5mg  daily and Lisinopril-HCT 20-12.5mg  2 tab dail -SCr stable  at 1.19 and K+ 4.3 on 11/20/2019  3.  HLD -LDL goal < 70 -LDL was 59 in July 2021 -continue on Simvastatin 20mg  daily  4.  Bradycardia -drug induced from BB -improved on lower dose of BB and asymptomatic  5.  DM2 -his HbA1C is elevated at 7.9% -followed by PCP -discussed the importance of trying to get better control -he is seeing his PCP soon to discuss this   Medication Adjustments/Labs and Tests Ordered: Current medicines are reviewed at length with the patient today.  Concerns regarding medicines are outlined above.  Tests Ordered: No orders of the defined types were placed in this encounter.  Medication Changes: No orders of the defined types were placed in this encounter.   Disposition:  Follow up in 1 year  Signed, Fransico Him, MD  02/09/2020 8:15 AM    [place]

## 2020-02-09 ENCOUNTER — Other Ambulatory Visit: Payer: Self-pay | Admitting: Neurosurgery

## 2020-02-09 ENCOUNTER — Other Ambulatory Visit: Payer: Self-pay

## 2020-02-09 ENCOUNTER — Encounter: Payer: Self-pay | Admitting: Cardiology

## 2020-02-09 ENCOUNTER — Ambulatory Visit (INDEPENDENT_AMBULATORY_CARE_PROVIDER_SITE_OTHER): Payer: Medicare Other | Admitting: Cardiology

## 2020-02-09 VITALS — BP 132/68 | HR 57 | Ht 70.0 in | Wt 224.8 lb

## 2020-02-09 DIAGNOSIS — I2583 Coronary atherosclerosis due to lipid rich plaque: Secondary | ICD-10-CM | POA: Diagnosis not present

## 2020-02-09 DIAGNOSIS — E7800 Pure hypercholesterolemia, unspecified: Secondary | ICD-10-CM

## 2020-02-09 DIAGNOSIS — R001 Bradycardia, unspecified: Secondary | ICD-10-CM

## 2020-02-09 DIAGNOSIS — I10 Essential (primary) hypertension: Secondary | ICD-10-CM

## 2020-02-09 DIAGNOSIS — R202 Paresthesia of skin: Secondary | ICD-10-CM

## 2020-02-09 DIAGNOSIS — I2510 Atherosclerotic heart disease of native coronary artery without angina pectoris: Secondary | ICD-10-CM

## 2020-02-09 DIAGNOSIS — M542 Cervicalgia: Secondary | ICD-10-CM | POA: Diagnosis not present

## 2020-02-09 NOTE — Patient Instructions (Signed)

## 2020-02-18 DIAGNOSIS — M542 Cervicalgia: Secondary | ICD-10-CM | POA: Diagnosis not present

## 2020-02-24 DIAGNOSIS — E1165 Type 2 diabetes mellitus with hyperglycemia: Secondary | ICD-10-CM | POA: Diagnosis not present

## 2020-02-25 DIAGNOSIS — M542 Cervicalgia: Secondary | ICD-10-CM | POA: Diagnosis not present

## 2020-02-27 ENCOUNTER — Other Ambulatory Visit: Payer: Medicare Other

## 2020-03-01 DIAGNOSIS — N183 Chronic kidney disease, stage 3 unspecified: Secondary | ICD-10-CM | POA: Diagnosis not present

## 2020-03-01 DIAGNOSIS — I10 Essential (primary) hypertension: Secondary | ICD-10-CM | POA: Diagnosis not present

## 2020-03-01 DIAGNOSIS — I25119 Atherosclerotic heart disease of native coronary artery with unspecified angina pectoris: Secondary | ICD-10-CM | POA: Diagnosis not present

## 2020-03-01 DIAGNOSIS — E1165 Type 2 diabetes mellitus with hyperglycemia: Secondary | ICD-10-CM | POA: Diagnosis not present

## 2020-03-01 DIAGNOSIS — E7800 Pure hypercholesterolemia, unspecified: Secondary | ICD-10-CM | POA: Diagnosis not present

## 2020-03-01 DIAGNOSIS — E1129 Type 2 diabetes mellitus with other diabetic kidney complication: Secondary | ICD-10-CM | POA: Diagnosis not present

## 2020-03-08 DIAGNOSIS — R030 Elevated blood-pressure reading, without diagnosis of hypertension: Secondary | ICD-10-CM | POA: Diagnosis not present

## 2020-03-08 DIAGNOSIS — R200 Anesthesia of skin: Secondary | ICD-10-CM | POA: Diagnosis not present

## 2020-03-08 DIAGNOSIS — Z6832 Body mass index (BMI) 32.0-32.9, adult: Secondary | ICD-10-CM | POA: Diagnosis not present

## 2020-03-18 DIAGNOSIS — Z23 Encounter for immunization: Secondary | ICD-10-CM | POA: Diagnosis not present

## 2020-03-20 ENCOUNTER — Ambulatory Visit: Payer: Medicare Other | Attending: Internal Medicine

## 2020-03-20 ENCOUNTER — Other Ambulatory Visit: Payer: Self-pay

## 2020-03-20 DIAGNOSIS — Z23 Encounter for immunization: Secondary | ICD-10-CM

## 2020-03-20 NOTE — Progress Notes (Signed)
   Covid-19 Vaccination Clinic  Name:  FRANCISCO OSTROVSKY    MRN: [id number] DOB: Apr 10, 1946  03/20/2020  Mr. Bevard was observed post Covid-19 immunization for 15 minutes without incident. He was provided with Vaccine Information Sheet and instruction to access the V-Safe system.   Mr. Sudol was instructed to call 911 with any severe reactions post vaccine: Marland Kitchen Difficulty breathing  . Swelling of face and throat  . A fast heartbeat  . A bad rash all over body  . Dizziness and weakness

## 2020-04-27 DIAGNOSIS — K219 Gastro-esophageal reflux disease without esophagitis: Secondary | ICD-10-CM | POA: Diagnosis not present

## 2020-04-27 DIAGNOSIS — E7800 Pure hypercholesterolemia, unspecified: Secondary | ICD-10-CM | POA: Diagnosis not present

## 2020-04-27 DIAGNOSIS — I10 Essential (primary) hypertension: Secondary | ICD-10-CM | POA: Diagnosis not present

## 2020-04-27 DIAGNOSIS — N183 Chronic kidney disease, stage 3 unspecified: Secondary | ICD-10-CM | POA: Diagnosis not present

## 2020-04-27 DIAGNOSIS — I25119 Atherosclerotic heart disease of native coronary artery with unspecified angina pectoris: Secondary | ICD-10-CM | POA: Diagnosis not present

## 2020-04-27 DIAGNOSIS — E1165 Type 2 diabetes mellitus with hyperglycemia: Secondary | ICD-10-CM | POA: Diagnosis not present

## 2020-04-27 DIAGNOSIS — E1129 Type 2 diabetes mellitus with other diabetic kidney complication: Secondary | ICD-10-CM | POA: Diagnosis not present

## 2020-05-03 DIAGNOSIS — Z20828 Contact with and (suspected) exposure to other viral communicable diseases: Secondary | ICD-10-CM | POA: Diagnosis not present

## 2020-06-11 DIAGNOSIS — E1129 Type 2 diabetes mellitus with other diabetic kidney complication: Secondary | ICD-10-CM | POA: Diagnosis not present

## 2020-06-11 DIAGNOSIS — Z125 Encounter for screening for malignant neoplasm of prostate: Secondary | ICD-10-CM | POA: Diagnosis not present

## 2020-06-11 DIAGNOSIS — E7800 Pure hypercholesterolemia, unspecified: Secondary | ICD-10-CM | POA: Diagnosis not present

## 2020-06-11 LAB — MICROALBUMIN, URINE: Microalb, Ur: 3.67

## 2020-06-11 LAB — PROTEIN / CREATININE RATIO, URINE: Creatinine, Urine: 82

## 2020-06-11 LAB — PSA: PSA: 1.13

## 2020-06-16 DIAGNOSIS — N1831 Chronic kidney disease, stage 3a: Secondary | ICD-10-CM | POA: Diagnosis not present

## 2020-06-16 DIAGNOSIS — G4733 Obstructive sleep apnea (adult) (pediatric): Secondary | ICD-10-CM | POA: Diagnosis not present

## 2020-06-16 DIAGNOSIS — E7800 Pure hypercholesterolemia, unspecified: Secondary | ICD-10-CM | POA: Diagnosis not present

## 2020-06-16 DIAGNOSIS — E1129 Type 2 diabetes mellitus with other diabetic kidney complication: Secondary | ICD-10-CM | POA: Diagnosis not present

## 2020-06-16 DIAGNOSIS — R55 Syncope and collapse: Secondary | ICD-10-CM | POA: Diagnosis not present

## 2020-06-16 DIAGNOSIS — I10 Essential (primary) hypertension: Secondary | ICD-10-CM | POA: Diagnosis not present

## 2020-06-16 DIAGNOSIS — N529 Male erectile dysfunction, unspecified: Secondary | ICD-10-CM | POA: Diagnosis not present

## 2020-06-16 DIAGNOSIS — R143 Flatulence: Secondary | ICD-10-CM | POA: Diagnosis not present

## 2020-06-16 DIAGNOSIS — Z1389 Encounter for screening for other disorder: Secondary | ICD-10-CM | POA: Diagnosis not present

## 2020-06-16 DIAGNOSIS — I25119 Atherosclerotic heart disease of native coronary artery with unspecified angina pectoris: Secondary | ICD-10-CM | POA: Diagnosis not present

## 2020-06-16 DIAGNOSIS — L309 Dermatitis, unspecified: Secondary | ICD-10-CM | POA: Diagnosis not present

## 2020-06-16 DIAGNOSIS — Z0000 Encounter for general adult medical examination without abnormal findings: Secondary | ICD-10-CM | POA: Diagnosis not present

## 2020-06-30 DIAGNOSIS — E1129 Type 2 diabetes mellitus with other diabetic kidney complication: Secondary | ICD-10-CM | POA: Diagnosis not present

## 2020-06-30 DIAGNOSIS — N183 Chronic kidney disease, stage 3 unspecified: Secondary | ICD-10-CM | POA: Diagnosis not present

## 2020-06-30 DIAGNOSIS — K219 Gastro-esophageal reflux disease without esophagitis: Secondary | ICD-10-CM | POA: Diagnosis not present

## 2020-06-30 DIAGNOSIS — I10 Essential (primary) hypertension: Secondary | ICD-10-CM | POA: Diagnosis not present

## 2020-06-30 DIAGNOSIS — E7800 Pure hypercholesterolemia, unspecified: Secondary | ICD-10-CM | POA: Diagnosis not present

## 2020-06-30 DIAGNOSIS — I25119 Atherosclerotic heart disease of native coronary artery with unspecified angina pectoris: Secondary | ICD-10-CM | POA: Diagnosis not present

## 2020-06-30 DIAGNOSIS — N1831 Chronic kidney disease, stage 3a: Secondary | ICD-10-CM | POA: Diagnosis not present

## 2020-06-30 DIAGNOSIS — E1165 Type 2 diabetes mellitus with hyperglycemia: Secondary | ICD-10-CM | POA: Diagnosis not present

## 2020-07-07 DIAGNOSIS — N1831 Chronic kidney disease, stage 3a: Secondary | ICD-10-CM | POA: Diagnosis not present

## 2020-08-09 DIAGNOSIS — L209 Atopic dermatitis, unspecified: Secondary | ICD-10-CM | POA: Diagnosis not present

## 2020-09-23 DIAGNOSIS — K219 Gastro-esophageal reflux disease without esophagitis: Secondary | ICD-10-CM | POA: Diagnosis not present

## 2020-09-23 DIAGNOSIS — I25119 Atherosclerotic heart disease of native coronary artery with unspecified angina pectoris: Secondary | ICD-10-CM | POA: Diagnosis not present

## 2020-09-23 DIAGNOSIS — E1129 Type 2 diabetes mellitus with other diabetic kidney complication: Secondary | ICD-10-CM | POA: Diagnosis not present

## 2020-09-23 DIAGNOSIS — E1165 Type 2 diabetes mellitus with hyperglycemia: Secondary | ICD-10-CM | POA: Diagnosis not present

## 2020-09-23 DIAGNOSIS — I10 Essential (primary) hypertension: Secondary | ICD-10-CM | POA: Diagnosis not present

## 2020-09-23 DIAGNOSIS — N1831 Chronic kidney disease, stage 3a: Secondary | ICD-10-CM | POA: Diagnosis not present

## 2020-09-23 DIAGNOSIS — E7800 Pure hypercholesterolemia, unspecified: Secondary | ICD-10-CM | POA: Diagnosis not present

## 2020-09-23 DIAGNOSIS — N183 Chronic kidney disease, stage 3 unspecified: Secondary | ICD-10-CM | POA: Diagnosis not present

## 2020-10-06 DIAGNOSIS — B356 Tinea cruris: Secondary | ICD-10-CM | POA: Diagnosis not present

## 2020-10-06 DIAGNOSIS — M5441 Lumbago with sciatica, right side: Secondary | ICD-10-CM | POA: Diagnosis not present

## 2020-10-06 DIAGNOSIS — M5442 Lumbago with sciatica, left side: Secondary | ICD-10-CM | POA: Diagnosis not present

## 2020-11-03 DIAGNOSIS — I10 Essential (primary) hypertension: Secondary | ICD-10-CM | POA: Diagnosis not present

## 2020-11-03 DIAGNOSIS — I25119 Atherosclerotic heart disease of native coronary artery with unspecified angina pectoris: Secondary | ICD-10-CM | POA: Diagnosis not present

## 2020-11-03 DIAGNOSIS — E7800 Pure hypercholesterolemia, unspecified: Secondary | ICD-10-CM | POA: Diagnosis not present

## 2020-11-03 DIAGNOSIS — E1165 Type 2 diabetes mellitus with hyperglycemia: Secondary | ICD-10-CM | POA: Diagnosis not present

## 2020-11-03 DIAGNOSIS — E1129 Type 2 diabetes mellitus with other diabetic kidney complication: Secondary | ICD-10-CM | POA: Diagnosis not present

## 2020-11-03 DIAGNOSIS — K219 Gastro-esophageal reflux disease without esophagitis: Secondary | ICD-10-CM | POA: Diagnosis not present

## 2020-11-03 DIAGNOSIS — N183 Chronic kidney disease, stage 3 unspecified: Secondary | ICD-10-CM | POA: Diagnosis not present

## 2020-11-30 ENCOUNTER — Encounter: Payer: Self-pay | Admitting: Orthopaedic Surgery

## 2020-11-30 ENCOUNTER — Ambulatory Visit (INDEPENDENT_AMBULATORY_CARE_PROVIDER_SITE_OTHER): Payer: Medicare Other | Admitting: Orthopaedic Surgery

## 2020-11-30 ENCOUNTER — Ambulatory Visit (INDEPENDENT_AMBULATORY_CARE_PROVIDER_SITE_OTHER): Payer: Medicare Other

## 2020-11-30 VITALS — Ht 70.0 in | Wt 225.0 lb

## 2020-11-30 DIAGNOSIS — M5441 Lumbago with sciatica, right side: Secondary | ICD-10-CM

## 2020-11-30 DIAGNOSIS — G8929 Other chronic pain: Secondary | ICD-10-CM | POA: Diagnosis not present

## 2020-11-30 MED ORDER — METHOCARBAMOL 500 MG PO TABS
500.0000 mg | ORAL_TABLET | Freq: Every evening | ORAL | 0 refills | Status: DC | PRN
Start: 2020-11-30 — End: 2021-09-21

## 2020-11-30 MED ORDER — PREDNISONE 5 MG (21) PO TBPK: ORAL_TABLET | ORAL | 0 refills | Status: DC

## 2020-11-30 NOTE — Progress Notes (Signed)
Office Visit Note   Patient: Carl Knapp           Date of Birth: [birth year]           MRN: [id number] Visit Date: 11/30/2020              Requested by: Maury Dus, MD [address] [place],  [place] [id number] PCP: Maury Dus, MD   Assessment & Plan: Visit Diagnoses:  1. Chronic right-sided low back pain with right-sided sciatica     Plan: Impression is right lower back pain with right lower extremity sciatica.  We have discussed starting the patient on a steroid taper, muscle relaxer nightly as needed in addition to a course of physical therapy, handwritten prescription provided for the therapy as he has a specific place and physical therapist he would like to see.  He will let us know over the next 6 to 8 weeks if his symptoms have not improved and at that point we will order an MRI of the lumbar spine.  Should his symptoms worsen in the meantime, he will let us know.  Call with concerns or questions.  Follow-Up Instructions: Return if symptoms worsen or fail to improve.   Orders:  Orders Placed This Encounter  Procedures   XR Lumbar Spine 2-3 Views   Meds ordered this encounter  Medications   predniSONE (STERAPRED UNI-PAK 21 TAB) 5 MG (21) TBPK tablet    Sig: Take as directed.  Keep a close eye on blood sugar    Dispense:  21 tablet    Refill:  0   methocarbamol (ROBAXIN) 500 MG tablet    Sig: Take 1 tablet (500 mg total) by mouth at bedtime as needed.    Dispense:  20 tablet    Refill:  0       Procedures: No procedures performed   Clinical Data: No additional findings.   Subjective: Chief Complaint  Patient presents with   Right Leg - Pain    HPI patient is a pleasant 75 year old gentleman who comes in today with midline and right lower back pain rating down the buttock down the anterolateral leg and into the foot.  He does note occasional groin pain and anterior thigh pain.  He has been dealing with this for about 9 months.  He is also  starting to get weakness and giving way sensations to the knees.  Pain is worse going from a seated to standing position as well as going down stairs or hills.  He does get improvement with movement.  He has been taking Tylenol which does seem to help.  He does note numbness to the toes on both feet but has a history of diabetes and probable neuropathy.  No bowel or bladder change or saddle paresthesias.  Review of Systems as detailed in HPI.  All others reviewed and are negative.   Objective: Vital Signs: Ht '5\' 10"'$  (1.778 m)   Wt 225 lb (102.1 kg)   BMI 32.28 kg/m   Physical Exam well-developed well-nourished gentleman in no acute distress.  Alert and oriented x3.  Ortho Exam lumbar spine exam shows mild spinous and paraspinous tenderness on the right.  No pain with lumbar flexion or extension.  Mildly positive straight leg raise.  Negative logroll and negative FADIR.  No focal weakness.  Workable right knee exam.  He is neurovascular intact distally.  Specialty Comments:  No specialty comments available.  Imaging: XR Lumbar Spine 2-3 Views  Result  Date: 11/30/2020 Moderate multilevel degenerative changes    PMFS History: Patient Active Problem List   Diagnosis Date Noted   CAD (coronary artery disease) 01/20/2014   HLD (hyperlipidemia) 01/02/2014   Obstructive sleep apnea 05/07/2013   Obesity (BMI 30-39.9) 05/07/2013   Essential hypertension, benign 05/07/2013   Past Medical History:  Diagnosis Date   Coronary artery disease 12/2013   20-30% RCA by cath- states was good.   Diabetes mellitus without complication ([place])    type II   DVT (deep venous thrombosis) ([facility])    bilateral legs-greater on left. -3-5 years ago-tx. coumadin x 1 year.   Hx of cardiovascular stress test    ETT-Myoview (7/15):  ECG with ST depression; freq PVCs, + chest pain; normal perfusion   Hyperlipidemia    LDL goal<100   Hypertension    Macular degeneration    mild   OSA (obstructive sleep apnea)     Severe w AHI 37/hr now on CPAP at 10cm H2O    Family History  Problem Relation Age of Onset   CAD Father    AAA (abdominal aortic aneurysm) Father    Heart attack Father    Hypertension Other     Past Surgical History:  Procedure Laterality Date   APPENDECTOMY     open '68   HERNIA REPAIR Bilateral    inguinal hernia repair '80  , umbilical repair Dr. Lucia Gaskins 06-14-17   INSERTION OF MESH N/A 06/14/2017   Procedure: INSERTION OF MESH;  Surgeon: Alphonsa Overall, MD;  Location: [facility];  Service: General;  Laterality: N/A;   LEFT HEART CATHETERIZATION WITH CORONARY ANGIOGRAM N/A 12/09/2013   Procedure: LEFT HEART CATHETERIZATION WITH CORONARY ANGIOGRAM;  Surgeon: Jettie Booze, MD;  Location: [facility] CATH LAB;  Service: Cardiovascular;  Laterality: N/A;   UMBILICAL HERNIA REPAIR N/A 06/14/2017   Procedure: LAPAROSCOPIC UMBILICAL HERNIA REPAIR WITH MESH ERAS PATHWAY;  Surgeon: Alphonsa Overall, MD;  Location: [facility];  Service: General;  Laterality: N/A;  ERAS PATHWAY   VASECTOMY     Social History   Occupational History   Not on file  Tobacco Use   Smoking status: Former    Packs/day: 0.50    Years: 20.00    Pack years: 10.00    Types: Cigarettes    Quit date: 06/08/2010    Years since quitting: 10.4   Smokeless tobacco: Never  Vaping Use   Vaping Use: Never used  Substance and Sexual Activity   Alcohol use: Yes    Comment: rare beer or glass of wine   Drug use: No   Sexual activity: Yes

## 2020-12-08 DIAGNOSIS — M4306 Spondylolysis, lumbar region: Secondary | ICD-10-CM | POA: Diagnosis not present

## 2020-12-15 DIAGNOSIS — M4306 Spondylolysis, lumbar region: Secondary | ICD-10-CM | POA: Diagnosis not present

## 2020-12-17 DIAGNOSIS — M4306 Spondylolysis, lumbar region: Secondary | ICD-10-CM | POA: Diagnosis not present

## 2020-12-22 DIAGNOSIS — E7800 Pure hypercholesterolemia, unspecified: Secondary | ICD-10-CM | POA: Diagnosis not present

## 2020-12-22 DIAGNOSIS — R55 Syncope and collapse: Secondary | ICD-10-CM | POA: Diagnosis not present

## 2020-12-22 DIAGNOSIS — N529 Male erectile dysfunction, unspecified: Secondary | ICD-10-CM | POA: Diagnosis not present

## 2020-12-22 DIAGNOSIS — N1831 Chronic kidney disease, stage 3a: Secondary | ICD-10-CM | POA: Diagnosis not present

## 2020-12-22 DIAGNOSIS — Z1152 Encounter for screening for COVID-19: Secondary | ICD-10-CM | POA: Diagnosis not present

## 2020-12-22 DIAGNOSIS — B351 Tinea unguium: Secondary | ICD-10-CM | POA: Diagnosis not present

## 2020-12-22 DIAGNOSIS — G720 Drug-induced myopathy: Secondary | ICD-10-CM | POA: Diagnosis not present

## 2020-12-22 DIAGNOSIS — L309 Dermatitis, unspecified: Secondary | ICD-10-CM | POA: Diagnosis not present

## 2020-12-22 DIAGNOSIS — R143 Flatulence: Secondary | ICD-10-CM | POA: Diagnosis not present

## 2020-12-22 DIAGNOSIS — I25119 Atherosclerotic heart disease of native coronary artery with unspecified angina pectoris: Secondary | ICD-10-CM | POA: Diagnosis not present

## 2020-12-22 DIAGNOSIS — E1129 Type 2 diabetes mellitus with other diabetic kidney complication: Secondary | ICD-10-CM | POA: Diagnosis not present

## 2020-12-22 DIAGNOSIS — I129 Hypertensive chronic kidney disease with stage 1 through stage 4 chronic kidney disease, or unspecified chronic kidney disease: Secondary | ICD-10-CM | POA: Diagnosis not present

## 2020-12-22 DIAGNOSIS — G4733 Obstructive sleep apnea (adult) (pediatric): Secondary | ICD-10-CM | POA: Diagnosis not present

## 2020-12-22 LAB — BASIC METABOLIC PANEL
BUN: 19 (ref 4–21)
CO2: 28 — AB (ref 13–22)
Chloride: 103 (ref 99–108)
Creatinine: 1 (ref 0.6–1.3)
Glucose: 110
Potassium: 4.6 mEq/L (ref 3.5–5.1)
Sodium: 138 (ref 137–147)

## 2020-12-22 LAB — LIPID PANEL
Cholesterol: 217 — AB (ref 0–200)
HDL: 46 (ref 35–70)
LDL Cholesterol: 135
Triglycerides: 202 — AB (ref 40–160)

## 2020-12-22 LAB — HEPATIC FUNCTION PANEL
ALT: 22 U/L (ref 10–40)
AST: 23 (ref 14–40)
Alkaline Phosphatase: 49 (ref 25–125)
Bilirubin, Total: 0.5

## 2020-12-22 LAB — COMPREHENSIVE METABOLIC PANEL
Albumin: 4.4 (ref 3.5–5.0)
Calcium: 9.7 (ref 8.7–10.7)

## 2020-12-22 LAB — HEMOGLOBIN A1C: Hemoglobin A1C: 7.2

## 2020-12-23 DIAGNOSIS — Z23 Encounter for immunization: Secondary | ICD-10-CM | POA: Diagnosis not present

## 2020-12-24 DIAGNOSIS — D120 Benign neoplasm of cecum: Secondary | ICD-10-CM | POA: Diagnosis not present

## 2020-12-24 DIAGNOSIS — K635 Polyp of colon: Secondary | ICD-10-CM | POA: Diagnosis not present

## 2020-12-24 DIAGNOSIS — Z1211 Encounter for screening for malignant neoplasm of colon: Secondary | ICD-10-CM | POA: Diagnosis not present

## 2020-12-24 DIAGNOSIS — K5730 Diverticulosis of large intestine without perforation or abscess without bleeding: Secondary | ICD-10-CM | POA: Diagnosis not present

## 2020-12-24 LAB — HM COLONOSCOPY

## 2020-12-28 DIAGNOSIS — K635 Polyp of colon: Secondary | ICD-10-CM | POA: Diagnosis not present

## 2020-12-29 DIAGNOSIS — N1831 Chronic kidney disease, stage 3a: Secondary | ICD-10-CM | POA: Diagnosis not present

## 2020-12-29 DIAGNOSIS — E1129 Type 2 diabetes mellitus with other diabetic kidney complication: Secondary | ICD-10-CM | POA: Diagnosis not present

## 2020-12-29 DIAGNOSIS — E1165 Type 2 diabetes mellitus with hyperglycemia: Secondary | ICD-10-CM | POA: Diagnosis not present

## 2020-12-29 DIAGNOSIS — N183 Chronic kidney disease, stage 3 unspecified: Secondary | ICD-10-CM | POA: Diagnosis not present

## 2020-12-29 DIAGNOSIS — E7800 Pure hypercholesterolemia, unspecified: Secondary | ICD-10-CM | POA: Diagnosis not present

## 2020-12-29 DIAGNOSIS — I25119 Atherosclerotic heart disease of native coronary artery with unspecified angina pectoris: Secondary | ICD-10-CM | POA: Diagnosis not present

## 2020-12-29 DIAGNOSIS — K219 Gastro-esophageal reflux disease without esophagitis: Secondary | ICD-10-CM | POA: Diagnosis not present

## 2020-12-29 DIAGNOSIS — I129 Hypertensive chronic kidney disease with stage 1 through stage 4 chronic kidney disease, or unspecified chronic kidney disease: Secondary | ICD-10-CM | POA: Diagnosis not present

## 2020-12-29 DIAGNOSIS — I10 Essential (primary) hypertension: Secondary | ICD-10-CM | POA: Diagnosis not present

## 2021-02-02 DIAGNOSIS — E119 Type 2 diabetes mellitus without complications: Secondary | ICD-10-CM | POA: Diagnosis not present

## 2021-02-02 LAB — HM DIABETES EYE EXAM

## 2021-02-15 ENCOUNTER — Ambulatory Visit: Payer: Medicare Other | Admitting: Cardiology

## 2021-04-06 DIAGNOSIS — E7800 Pure hypercholesterolemia, unspecified: Secondary | ICD-10-CM | POA: Diagnosis not present

## 2021-04-06 DIAGNOSIS — N1831 Chronic kidney disease, stage 3a: Secondary | ICD-10-CM | POA: Diagnosis not present

## 2021-04-06 DIAGNOSIS — E1129 Type 2 diabetes mellitus with other diabetic kidney complication: Secondary | ICD-10-CM | POA: Diagnosis not present

## 2021-04-06 DIAGNOSIS — I129 Hypertensive chronic kidney disease with stage 1 through stage 4 chronic kidney disease, or unspecified chronic kidney disease: Secondary | ICD-10-CM | POA: Diagnosis not present

## 2021-04-06 DIAGNOSIS — I10 Essential (primary) hypertension: Secondary | ICD-10-CM | POA: Diagnosis not present

## 2021-04-06 DIAGNOSIS — K219 Gastro-esophageal reflux disease without esophagitis: Secondary | ICD-10-CM | POA: Diagnosis not present

## 2021-04-06 DIAGNOSIS — E1165 Type 2 diabetes mellitus with hyperglycemia: Secondary | ICD-10-CM | POA: Diagnosis not present

## 2021-04-13 DIAGNOSIS — I25119 Atherosclerotic heart disease of native coronary artery with unspecified angina pectoris: Secondary | ICD-10-CM | POA: Diagnosis not present

## 2021-04-13 DIAGNOSIS — E1129 Type 2 diabetes mellitus with other diabetic kidney complication: Secondary | ICD-10-CM | POA: Diagnosis not present

## 2021-04-13 DIAGNOSIS — I10 Essential (primary) hypertension: Secondary | ICD-10-CM | POA: Diagnosis not present

## 2021-04-13 DIAGNOSIS — I129 Hypertensive chronic kidney disease with stage 1 through stage 4 chronic kidney disease, or unspecified chronic kidney disease: Secondary | ICD-10-CM | POA: Diagnosis not present

## 2021-04-13 DIAGNOSIS — E1165 Type 2 diabetes mellitus with hyperglycemia: Secondary | ICD-10-CM | POA: Diagnosis not present

## 2021-04-13 DIAGNOSIS — K219 Gastro-esophageal reflux disease without esophagitis: Secondary | ICD-10-CM | POA: Diagnosis not present

## 2021-04-13 DIAGNOSIS — N1831 Chronic kidney disease, stage 3a: Secondary | ICD-10-CM | POA: Diagnosis not present

## 2021-04-13 DIAGNOSIS — E7800 Pure hypercholesterolemia, unspecified: Secondary | ICD-10-CM | POA: Diagnosis not present

## 2021-04-22 ENCOUNTER — Telehealth: Payer: Medicare Other | Admitting: Cardiology

## 2021-05-03 DIAGNOSIS — Z20822 Contact with and (suspected) exposure to covid-19: Secondary | ICD-10-CM | POA: Diagnosis not present

## 2021-06-23 DIAGNOSIS — I129 Hypertensive chronic kidney disease with stage 1 through stage 4 chronic kidney disease, or unspecified chronic kidney disease: Secondary | ICD-10-CM | POA: Diagnosis not present

## 2021-06-23 DIAGNOSIS — E7800 Pure hypercholesterolemia, unspecified: Secondary | ICD-10-CM | POA: Diagnosis not present

## 2021-06-23 DIAGNOSIS — Z125 Encounter for screening for malignant neoplasm of prostate: Secondary | ICD-10-CM | POA: Diagnosis not present

## 2021-06-23 DIAGNOSIS — E1129 Type 2 diabetes mellitus with other diabetic kidney complication: Secondary | ICD-10-CM | POA: Diagnosis not present

## 2021-06-23 LAB — BASIC METABOLIC PANEL
BUN: 25 — AB (ref 4–21)
CO2: 32 — AB (ref 13–22)
Chloride: 104 (ref 99–108)
Creatinine: 1.2 (ref <=1.3)
Glucose: 153
Potassium: 4.5 mEq/L (ref 3.5–5.1)
Sodium: 142 (ref 137–147)

## 2021-06-23 LAB — CBC AND DIFFERENTIAL
HCT: 39 — AB (ref 41–53)
Hemoglobin: 13.2 — AB (ref 13.5–17.5)
Platelets: 262 10*3/uL (ref 150–400)
WBC: 7.3

## 2021-06-23 LAB — PSA: PSA: 0.93

## 2021-06-23 LAB — COMPREHENSIVE METABOLIC PANEL
Albumin: 4.3 (ref 3.5–5.0)
Calcium: 9.2 (ref 8.7–10.7)
eGFR: 64

## 2021-06-23 LAB — HEPATIC FUNCTION PANEL
ALT: 25 U/L (ref 10–40)
AST: 21 (ref 14–40)
Alkaline Phosphatase: 59 (ref 25–125)
Bilirubin, Total: 0.5

## 2021-06-23 LAB — MICROALBUMIN, URINE: Microalb, Ur: 84.28

## 2021-06-23 LAB — CBC: RBC: 4.11 (ref 3.87–5.11)

## 2021-06-23 LAB — LIPID PANEL
Cholesterol: 196 (ref 0–200)
HDL: 41 (ref 35–70)
LDL Cholesterol: 123
Triglycerides: 175 — AB (ref 40–160)

## 2021-06-23 LAB — HEMOGLOBIN A1C: Hemoglobin A1C: 7.8

## 2021-06-23 LAB — PROTEIN / CREATININE RATIO, URINE: Creatinine, Urine: 105

## 2021-06-27 DIAGNOSIS — E7800 Pure hypercholesterolemia, unspecified: Secondary | ICD-10-CM | POA: Diagnosis not present

## 2021-06-27 DIAGNOSIS — E1129 Type 2 diabetes mellitus with other diabetic kidney complication: Secondary | ICD-10-CM | POA: Diagnosis not present

## 2021-06-27 DIAGNOSIS — Z1389 Encounter for screening for other disorder: Secondary | ICD-10-CM | POA: Diagnosis not present

## 2021-06-27 DIAGNOSIS — R55 Syncope and collapse: Secondary | ICD-10-CM | POA: Diagnosis not present

## 2021-06-27 DIAGNOSIS — Z23 Encounter for immunization: Secondary | ICD-10-CM | POA: Diagnosis not present

## 2021-06-27 DIAGNOSIS — I129 Hypertensive chronic kidney disease with stage 1 through stage 4 chronic kidney disease, or unspecified chronic kidney disease: Secondary | ICD-10-CM | POA: Diagnosis not present

## 2021-06-27 DIAGNOSIS — G4733 Obstructive sleep apnea (adult) (pediatric): Secondary | ICD-10-CM | POA: Diagnosis not present

## 2021-06-27 DIAGNOSIS — G720 Drug-induced myopathy: Secondary | ICD-10-CM | POA: Diagnosis not present

## 2021-06-27 DIAGNOSIS — N1831 Chronic kidney disease, stage 3a: Secondary | ICD-10-CM | POA: Diagnosis not present

## 2021-06-27 DIAGNOSIS — Z0000 Encounter for general adult medical examination without abnormal findings: Secondary | ICD-10-CM | POA: Diagnosis not present

## 2021-06-27 DIAGNOSIS — I25119 Atherosclerotic heart disease of native coronary artery with unspecified angina pectoris: Secondary | ICD-10-CM | POA: Diagnosis not present

## 2021-06-27 DIAGNOSIS — L309 Dermatitis, unspecified: Secondary | ICD-10-CM | POA: Diagnosis not present

## 2021-07-11 NOTE — Progress Notes (Signed)
Date:  07/12/2021   ID:  MURVIN GIFT, DOB 1945-06-03, MRN [id number]  Patient Location:  Home  Provider location:   [place]  PCP:  Maury Dus, MD  Cardiologist:  Fransico Him, MD  Electrophysiologist:  None   Chief Complaint:  CAD,HTN, OSA  History of Present Illness:    Carl Knapp is a 76 y.o. male with a hx of severe OSA with AHI 37/hr and now on CPAP at 10cm H2O. he has a history of CAD with coronary CTA showing a calcium score of 0 but 25-49% noncalcified plaque in the mid RCA and mid LAD.  Medical management was recommended at that time.  CT FFR showed a possible flow limiting lesion in the mid to distal LAD but felt likely related to distal tapering of the vessel and not from hemodynamically flow limiting lesion.    He is here today for followup and is doing well.  He has been having some problems with chest discomfort after he eats and when he walks up stairs.  He says that it feels like indigestion.  There is no radiation of the pain but he does get diaphoretic with it and nauseated.  He also has noticed some DOE. He denies any PND, orthopnea, LE edema, dizziness, palpitations or syncope. He is compliant with his meds and is tolerating meds with no SE.     He is doing well with his CPAP device and thinks that he has gotten used to it.  He tolerates the mask and feels the pressure is adequate.  Since going on CPAP he feels rested in the am and has no significant daytime sleepiness.  He denies any significant mouth or nasal dryness or nasal congestion.  He does not think that he snores.     Prior CV studies:   The following studies were reviewed today:  Coronary CTA, EKG  Past Medical History:  Diagnosis Date   Coronary artery disease 12/2013   20-30% RCA by cath- states was good.   Diabetes mellitus without complication ([place])    type II   DVT (deep venous thrombosis) (HCC)    bilateral legs-greater on left. -3-5 years ago-tx. coumadin x 1 year.   Hx of  cardiovascular stress test    ETT-Myoview (7/15):  ECG with ST depression; freq PVCs, + chest pain; normal perfusion   Hyperlipidemia    LDL goal<100   Hypertension    Macular degeneration    mild   OSA (obstructive sleep apnea)    Severe w AHI 37/hr now on CPAP at 10cm H2O   Past Surgical History:  Procedure Laterality Date   APPENDECTOMY     open '68   HERNIA REPAIR Bilateral    inguinal hernia repair '80  , umbilical repair Dr. Lucia Gaskins 06-14-17   INSERTION OF MESH N/A 06/14/2017   Procedure: INSERTION OF MESH;  Surgeon: Alphonsa Overall, MD;  Location: [facility];  Service: General;  Laterality: N/A;   [place] N/A 12/09/2013   Procedure: LEFT HEART CATHETERIZATION WITH CORONARY ANGIOGRAM;  Surgeon: Jettie Booze, MD;  Location: [facility] CATH LAB;  Service: Cardiovascular;  Laterality: N/A;   UMBILICAL HERNIA REPAIR N/A 06/14/2017   Procedure: LAPAROSCOPIC UMBILICAL HERNIA REPAIR WITH MESH ERAS PATHWAY;  Surgeon: Alphonsa Overall, MD;  Location: [facility];  Service: General;  Laterality: N/A;  ERAS PATHWAY   VASECTOMY       Current Meds  Medication Sig   acetaminophen (TYLENOL) 500 MG tablet  Take 1,000 mg by mouth daily as needed for moderate pain or headache.   amLODipine (NORVASC) 10 MG tablet Take 10 mg by mouth daily.   aspirin EC 81 MG tablet Take 81 mg by mouth daily.   atorvastatin (LIPITOR) 10 MG tablet Take 10 mg by mouth once a week.   KLOR-CON M20 20 MEQ tablet Take 40 mEq by mouth every evening.    lisinopril-hydrochlorothiazide (PRINZIDE,ZESTORETIC) 20-12.5 MG per tablet Take 2 tablets by mouth daily.   loratadine (CLARITIN) 10 MG tablet Take 10 mg by mouth daily as needed for allergies.   metFORMIN (GLUCOPHAGE) 1000 MG tablet Take 1,000 mg by mouth 2 (two) times daily with a meal.    methocarbamol (ROBAXIN) 500 MG tablet Take 1 tablet (500 mg total) by mouth at bedtime as needed.   metoprolol succinate (TOPROL-XL) 25 MG 24 hr tablet Take 12.5  mg by mouth daily.   pioglitazone (ACTOS) 15 MG tablet Take 15 mg by mouth as directed.   sildenafil (REVATIO) 20 MG tablet Take 40-60 mg by mouth daily as needed (erectile dysfunction).     Allergies:   Patient has no active allergies.   Social History   Tobacco Use   Smoking status: Former    Packs/day: 0.50    Years: 20.00    Pack years: 10.00    Types: Cigarettes    Quit date: 06/08/2010    Years since quitting: 11.1   Smokeless tobacco: Never  Vaping Use   Vaping Use: Never used  Substance Use Topics   Alcohol use: Yes    Comment: rare beer or glass of wine   Drug use: No     Family Hx: The patient's family history includes AAA (abdominal aortic aneurysm) in his father; CAD in his father; Heart attack in his father; Hypertension in an other family member.  ROS:   Please see the history of present illness.     All other systems reviewed and are negative.   Labs/Other Tests and Data Reviewed:    Recent Labs: No results found for requested labs within last 8760 hours.   Recent Lipid Panel No results found for: CHOL, TRIG, HDL, CHOLHDL, LDLCALC, LDLDIRECT  Wt Readings from Last 3 Encounters:  07/12/21 234 lb 12.8 oz (106.5 kg)  11/30/20 225 lb (102.1 kg)  02/09/20 224 lb 12.8 oz (102 kg)     Objective:    Vital Signs:  BP 132/80    Pulse 64    Ht '5\' 10"'$  (1.778 m)    Wt 234 lb 12.8 oz (106.5 kg)    SpO2 94%    BMI 33.69 kg/m   GEN: Well nourished, well developed in no acute distress HEENT: Normal NECK: No JVD; No carotid bruits LYMPHATICS: No lymphadenopathy CARDIAC:RRR, no murmurs, rubs, gallops RESPIRATORY:  Clear to auscultation without rales, wheezing or rhonchi  ABDOMEN: Soft, non-tender, non-distended MUSCULOSKELETAL:  No edema; No deformity  SKIN: Warm and dry NEUROLOGIC:  Alert and oriented x 3 PSYCHIATRIC:  Normal affect    EKG was performed in the office today and demonstrates NSR with no ST changes  ASSESSMENT & PLAN:    1.  ASCAD/Chest  pain -coronary CTA showed a calcium score of 0 but 25-49% noncalcified plaque in the mid RCA and mid LAD.   -CT FFR showed a possible flow limiting lesion in the mid to distal LAD but felt likely related to distal tapering of the vessel and not from hemodynamically flow limiting lesion.   -Medical  management was recommended at that time.  -He recently has had problems with exertional CP and DOE so I have recommended a stress myoview to rule out ischemia -Shared Decision Making/Informed Consent The risks [chest pain, shortness of breath, cardiac arrhythmias, dizziness, blood pressure fluctuations, myocardial infarction, stroke/transient ischemic attack, nausea, vomiting, allergic reaction, radiation exposure, metallic taste sensation and life-threatening complications (estimated to be 1 in 10,000)], benefits (risk stratification, diagnosing coronary artery disease, treatment guidance) and alternatives of a nuclear stress test were discussed in detail with Mr. Gettel and he agrees to proceed.  -He will continue on amlodipine '10mg'$  daily, ASA, low dose BB and statin  2.  HTN -His BP is well controlled on exam today. -continue prescription drug management with amlodipine 10 mg daily, Toprol-XL 12.5 mg daily, lisinopril HCT 20-12.5 mg 2 tablets daily with as needed refills -I have personally reviewed and interpreted outside labs performed by patient's PCP which showed serum creatinine 1.18, potassium 4.5 on 06/23/2021  3.  HLD -LDL goal < 70 -I have personally reviewed and interpreted outside labs performed by patient's PCP which showed LDL 123, HDL 41, triglycerides 175 on 06/23/2021 -he had been off simvastatin for over a year because he was having body aches but going off the statin did not help so his PCP placed him on Atorvastatin '10mg'$  once weekly -Increase atorvastatin to 20 mg daily -Repeat FLP and ALT in 6 weeks  4.  OSA - The patient is tolerating PAP therapy well without any problems. The PAP  download performed by his DME was personally reviewed and interpreted by me today and showed an AHI of 1.7/hr on auto CPAP cm H2O with 97% compliance in using more than 4 hours nightly.  The patient has been using and benefiting from PAP use and will continue to benefit from therapy.      Medication Adjustments/Labs and Tests Ordered: Current medicines are reviewed at length with the patient today.  Concerns regarding medicines are outlined above.  Tests Ordered: Orders Placed This Encounter  Procedures   EKG 12-Lead   Medication Changes: No orders of the defined types were placed in this encounter.   Disposition:  Follow up in 1 year  Signed, Fransico Him, MD  07/12/2021 9:01 AM    [place]

## 2021-07-12 ENCOUNTER — Other Ambulatory Visit: Payer: Self-pay

## 2021-07-12 ENCOUNTER — Telehealth (HOSPITAL_COMMUNITY): Payer: Self-pay | Admitting: *Deleted

## 2021-07-12 ENCOUNTER — Ambulatory Visit (INDEPENDENT_AMBULATORY_CARE_PROVIDER_SITE_OTHER): Payer: Medicare Other | Admitting: Cardiology

## 2021-07-12 ENCOUNTER — Encounter: Payer: Self-pay | Admitting: Cardiology

## 2021-07-12 VITALS — BP 132/80 | HR 64 | Ht 70.0 in | Wt 234.8 lb

## 2021-07-12 DIAGNOSIS — E7800 Pure hypercholesterolemia, unspecified: Secondary | ICD-10-CM | POA: Diagnosis not present

## 2021-07-12 DIAGNOSIS — I2510 Atherosclerotic heart disease of native coronary artery without angina pectoris: Secondary | ICD-10-CM

## 2021-07-12 DIAGNOSIS — I10 Essential (primary) hypertension: Secondary | ICD-10-CM

## 2021-07-12 MED ORDER — ATORVASTATIN CALCIUM 20 MG PO TABS: 20.0000 mg | ORAL_TABLET | Freq: Every day | ORAL | 3 refills | Status: DC

## 2021-07-12 NOTE — Addendum Note (Signed)
Addended by: Antonieta Iba on: 07/12/2021 09:08 AM ? ? Modules accepted: Orders ? ?

## 2021-07-12 NOTE — Patient Instructions (Signed)
Medication Instructions:  ?Your physician has recommended you make the following change in your medication: ?1) INCREASE atorvastatin (Lipitor) to 20 mg daily  ?*If you need a refill on your cardiac medications before your next appointment, please call your pharmacy* ? ?Lab Work: ?IN 6 WEEKS: fasting lipids and ALT ?If you have labs (blood work) drawn today and your tests are completely normal, you will receive your results only by: ?MyChart Message (if you have MyChart) OR ?A paper copy in the mail ?If you have any lab test that is abnormal or we need to change your treatment, we will call you to review the results. ? ?Testing/Procedures: ?Your physician has requested that you have en exercise stress myoview. For further information please visit [email or web address]. Please follow instruction sheet, as given. ? ?Follow-Up: ?At [facility], you and your health needs are our priority.  As part of our continuing mission to provide you with exceptional heart care, we have created designated Provider Care Teams.  These Care Teams include your primary Cardiologist (physician) and Advanced Practice Providers (APPs -  Physician Assistants and Nurse Practitioners) who all work together to provide you with the care you need, when you need it. ? ?Your next appointment:   ?1 year(s) ? ?The format for your next appointment:   ?In Person ? ?Provider:   ?Fransico Him, MD   ? ? ?

## 2021-07-12 NOTE — Addendum Note (Signed)
Addended by: Fransico Him R on: 07/12/2021 09:30 AM ? ? Modules accepted: Orders ? ?

## 2021-07-12 NOTE — Telephone Encounter (Signed)
Left message on voicemail per DPR in reference to upcoming appointment scheduled on 07/19/2021 at 7:30 with detailed instructions given per Myocardial Perfusion Study Information Sheet for the test. LM to arrive 15 minutes early, and that it is imperative to arrive on time for appointment to keep from having the test rescheduled. If you need to cancel or reschedule your appointment, please call the office within 24 hours of your appointment. Failure to do so may result in a cancellation of your appointment, and a $50 no show fee. Phone number given for call back for any questions.  ? ?

## 2021-07-19 ENCOUNTER — Ambulatory Visit (HOSPITAL_COMMUNITY): Payer: Medicare Other | Attending: Cardiology

## 2021-07-19 ENCOUNTER — Other Ambulatory Visit: Payer: Self-pay

## 2021-07-19 DIAGNOSIS — I2510 Atherosclerotic heart disease of native coronary artery without angina pectoris: Secondary | ICD-10-CM

## 2021-07-19 DIAGNOSIS — E7800 Pure hypercholesterolemia, unspecified: Secondary | ICD-10-CM | POA: Diagnosis not present

## 2021-07-19 DIAGNOSIS — I10 Essential (primary) hypertension: Secondary | ICD-10-CM | POA: Diagnosis not present

## 2021-07-19 LAB — MYOCARDIAL PERFUSION IMAGING
LV dias vol: 96 mL (ref 62–150)
LV sys vol: 35 mL
Nuc Stress EF: 63 %
Peak HR: 93 {beats}/min
Rest HR: 61 {beats}/min
Rest Nuclear Isotope Dose: 10.9 mCi
SDS: 0
SRS: 0
SSS: 0
ST Depression (mm): 0 mm
Stress Nuclear Isotope Dose: 32.1 mCi
TID: 0.87

## 2021-07-19 MED ORDER — REGADENOSON 0.4 MG/5ML IV SOLN
0.4000 mg | Freq: Once | INTRAVENOUS | Status: AC
  Administered 2021-07-19: 0.4 mg via INTRAVENOUS

## 2021-07-19 MED ORDER — TECHNETIUM TC 99M TETROFOSMIN IV KIT
10.9000 | PACK | Freq: Once | INTRAVENOUS | Status: AC | PRN
  Administered 2021-07-19: 10.9 via INTRAVENOUS
  Filled 2021-07-19: qty 11

## 2021-07-19 MED ORDER — TECHNETIUM TC 99M TETROFOSMIN IV KIT
32.1000 | PACK | Freq: Once | INTRAVENOUS | Status: AC | PRN
  Administered 2021-07-19: 32.1 via INTRAVENOUS
  Filled 2021-07-19: qty 33

## 2021-07-20 ENCOUNTER — Telehealth: Payer: Self-pay

## 2021-07-20 ENCOUNTER — Ambulatory Visit (INDEPENDENT_AMBULATORY_CARE_PROVIDER_SITE_OTHER): Payer: Medicare Other

## 2021-07-20 DIAGNOSIS — I493 Ventricular premature depolarization: Secondary | ICD-10-CM

## 2021-07-20 DIAGNOSIS — R931 Abnormal findings on diagnostic imaging of heart and coronary circulation: Secondary | ICD-10-CM

## 2021-07-20 DIAGNOSIS — R079 Chest pain, unspecified: Secondary | ICD-10-CM

## 2021-07-20 NOTE — Telephone Encounter (Signed)
Attempted to call patient, but no answer. Sent My chart message to patient, who has already viewed results and Dr Theodosia Blender comments, to ensure he is okay with plan before placing orders. Will await response. ?

## 2021-07-20 NOTE — Telephone Encounter (Signed)
-----   Message from Sueanne Margarita, MD sent at 07/19/2021 11:10 PM EDT ----- ?I reviewed the images of the nuclear stress test (reported as new inferior perfusion defect) but upon my review of images there is normal perfusion.  ? If ventricular ectopy is etiology of patient sx of SOB and CP. Please get a 2D echo to assess LVF and a 2 week ziopatch to assess PVC load ?

## 2021-07-20 NOTE — Progress Notes (Unsigned)
Enrolled for [employer] to mail a ZIO XT long term holter monitor to the patients address on file.  

## 2021-07-20 NOTE — Telephone Encounter (Signed)
Orders placed at this time for ECHO and zio monitor. ?

## 2021-07-22 DIAGNOSIS — I493 Ventricular premature depolarization: Secondary | ICD-10-CM

## 2021-07-22 DIAGNOSIS — R931 Abnormal findings on diagnostic imaging of heart and coronary circulation: Secondary | ICD-10-CM

## 2021-07-22 DIAGNOSIS — R079 Chest pain, unspecified: Secondary | ICD-10-CM

## 2021-07-29 ENCOUNTER — Ambulatory Visit (HOSPITAL_COMMUNITY): Payer: Medicare Other | Attending: Internal Medicine

## 2021-07-29 ENCOUNTER — Other Ambulatory Visit: Payer: Self-pay

## 2021-07-29 DIAGNOSIS — R931 Abnormal findings on diagnostic imaging of heart and coronary circulation: Secondary | ICD-10-CM | POA: Diagnosis not present

## 2021-07-29 DIAGNOSIS — R0609 Other forms of dyspnea: Secondary | ICD-10-CM | POA: Diagnosis not present

## 2021-07-29 DIAGNOSIS — R079 Chest pain, unspecified: Secondary | ICD-10-CM | POA: Insufficient documentation

## 2021-07-29 LAB — ECHOCARDIOGRAM LIMITED
Area-P 1/2: 3.34 cm2
S' Lateral: 2.7 cm

## 2021-08-02 ENCOUNTER — Telehealth: Payer: Self-pay | Admitting: Cardiology

## 2021-08-02 NOTE — Telephone Encounter (Signed)
The patient has been notified of the result and verbalized understanding.  All questions (if any) were answered. ?Antonieta Iba, RN 08/02/2021 4:54 PM  ? ?

## 2021-08-02 NOTE — Telephone Encounter (Signed)
-----   Message from Sueanne Margarita, MD sent at 07/29/2021  1:36 PM EDT ----- ?2D echo showed normal LV function with mild left atrial enlargement otherwise normal echo ?

## 2021-08-02 NOTE — Telephone Encounter (Signed)
Patient returning call to go over Echo results  

## 2021-08-10 ENCOUNTER — Encounter: Payer: Self-pay | Admitting: Cardiology

## 2021-08-10 ENCOUNTER — Telehealth: Payer: Self-pay

## 2021-08-10 DIAGNOSIS — R079 Chest pain, unspecified: Secondary | ICD-10-CM | POA: Diagnosis not present

## 2021-08-10 DIAGNOSIS — R001 Bradycardia, unspecified: Secondary | ICD-10-CM

## 2021-08-10 DIAGNOSIS — R931 Abnormal findings on diagnostic imaging of heart and coronary circulation: Secondary | ICD-10-CM

## 2021-08-10 DIAGNOSIS — I10 Essential (primary) hypertension: Secondary | ICD-10-CM

## 2021-08-10 DIAGNOSIS — I493 Ventricular premature depolarization: Secondary | ICD-10-CM

## 2021-08-10 DIAGNOSIS — I471 Supraventricular tachycardia: Secondary | ICD-10-CM | POA: Insufficient documentation

## 2021-08-10 MED ORDER — METOPROLOL SUCCINATE ER 25 MG PO TB24: 25.0000 mg | ORAL_TABLET | Freq: Every day | ORAL | 3 refills | Status: DC

## 2021-08-10 NOTE — Telephone Encounter (Signed)
The patient has been notified of the result and verbalized understanding.  All questions (if any) were answered. ?Antonieta Iba, RN 08/10/2021 4:59 PM  ?Labs have been ordered. Increased dose of Toprol has been sent in. Follow up has been scheduled. ?

## 2021-08-10 NOTE — Telephone Encounter (Signed)
-----   Message from Sueanne Margarita, MD sent at 08/10/2021  1:41 PM EDT ----- ?Heart monitor showed extra heartbeats from the bottom and top of the heart.  Please increase Toprol-XL to 25 mg daily and follow-up with Sharrell Ku or other extender in 4 weeks.  Please have him come in for a magnesium, bmet and TSH ?

## 2021-08-17 MED ORDER — METOPROLOL SUCCINATE ER 25 MG PO TB24: 25.0000 mg | ORAL_TABLET | Freq: Every day | ORAL | 3 refills | Status: DC

## 2021-08-17 NOTE — Telephone Encounter (Signed)
Pt called wanting his refill for Metoprolol to be sent to [facility], instead of [facility]. ? ?Called [facility] and verbally cancelled the refill and phoned into [facility]. ?

## 2021-08-17 NOTE — Addendum Note (Signed)
Addended by: Gaetano Net on: 08/17/2021 08:21 AM ? ? Modules accepted: Orders ? ?

## 2021-08-18 ENCOUNTER — Other Ambulatory Visit: Payer: Medicare Other

## 2021-08-18 DIAGNOSIS — R001 Bradycardia, unspecified: Secondary | ICD-10-CM | POA: Diagnosis not present

## 2021-08-18 DIAGNOSIS — E7800 Pure hypercholesterolemia, unspecified: Secondary | ICD-10-CM | POA: Diagnosis not present

## 2021-08-18 DIAGNOSIS — I493 Ventricular premature depolarization: Secondary | ICD-10-CM | POA: Diagnosis not present

## 2021-08-18 DIAGNOSIS — R931 Abnormal findings on diagnostic imaging of heart and coronary circulation: Secondary | ICD-10-CM

## 2021-08-18 DIAGNOSIS — I2510 Atherosclerotic heart disease of native coronary artery without angina pectoris: Secondary | ICD-10-CM

## 2021-08-18 DIAGNOSIS — R079 Chest pain, unspecified: Secondary | ICD-10-CM | POA: Diagnosis not present

## 2021-08-18 DIAGNOSIS — I10 Essential (primary) hypertension: Secondary | ICD-10-CM

## 2021-08-18 LAB — TSH: TSH: 1.69 u[IU]/mL (ref 0.450–4.500)

## 2021-08-18 LAB — LIPID PANEL
Chol/HDL Ratio: 3.1 ratio (ref 0.0–5.0)
Cholesterol, Total: 135 mg/dL (ref 100–199)
HDL: 43 mg/dL (ref >39)
LDL Chol Calc (NIH): 76 mg/dL (ref 0–99)
Triglycerides: 84 mg/dL (ref 0–149)
VLDL Cholesterol Cal: 16 mg/dL (ref 5–40)

## 2021-08-18 LAB — BASIC METABOLIC PANEL
BUN/Creatinine Ratio: 18 (ref 10–24)
BUN: 24 mg/dL (ref 8–27)
CO2: 24 mmol/L (ref 20–29)
Calcium: 9.5 mg/dL (ref 8.6–10.2)
Chloride: 104 mmol/L (ref 96–106)
Creatinine, Ser: 1.31 mg/dL — ABNORMAL HIGH (ref 0.76–1.27)
Glucose: 121 mg/dL — ABNORMAL HIGH (ref 70–99)
Potassium: 4.8 mmol/L (ref 3.5–5.2)
Sodium: 141 mmol/L (ref 134–144)
eGFR: 57 mL/min/{1.73_m2} — ABNORMAL LOW (ref >59)

## 2021-08-18 LAB — MAGNESIUM: Magnesium: 2.1 mg/dL (ref 1.6–2.3)

## 2021-08-18 LAB — ALT: ALT: 23 IU/L (ref 0–44)

## 2021-08-19 ENCOUNTER — Telehealth: Payer: Self-pay

## 2021-08-19 DIAGNOSIS — E7800 Pure hypercholesterolemia, unspecified: Secondary | ICD-10-CM

## 2021-08-19 DIAGNOSIS — I10 Essential (primary) hypertension: Secondary | ICD-10-CM

## 2021-08-19 DIAGNOSIS — R079 Chest pain, unspecified: Secondary | ICD-10-CM

## 2021-08-19 DIAGNOSIS — R001 Bradycardia, unspecified: Secondary | ICD-10-CM

## 2021-08-19 MED ORDER — LOSARTAN POTASSIUM 100 MG PO TABS: 100.0000 mg | ORAL_TABLET | Freq: Every day | ORAL | 3 refills | Status: DC

## 2021-08-19 MED ORDER — ATORVASTATIN CALCIUM 40 MG PO TABS: 40.0000 mg | ORAL_TABLET | Freq: Every day | ORAL | 3 refills | Status: DC

## 2021-08-19 NOTE — Telephone Encounter (Signed)
The patient has been notified of the result and verbalized understanding.  All questions (if any) were answered. ?Antonieta Iba, RN 08/19/2021 2:05 PM  ?Rx has been sent in for losartan and increased dose of atorvastatin. Repeat labs have been scheduled for 1 week and 6 weeks out. ?

## 2021-08-19 NOTE — Telephone Encounter (Signed)
-----   Message from Sueanne Margarita, MD sent at 08/18/2021  6:03 PM EDT ----- ?LDL not at goal - increase Atorvastatin to '40mg'$  daily and repeat FLP and ALT in 6 week.  SCr bumped.  Stop Lisinopril HCT and potassium and change to Losartan '100mg'$  daily and repeat  BMET in 1 week ?

## 2021-08-23 ENCOUNTER — Other Ambulatory Visit: Payer: Medicare Other

## 2021-08-25 ENCOUNTER — Telehealth: Payer: Self-pay | Admitting: Cardiology

## 2021-08-25 NOTE — Telephone Encounter (Signed)
Spoke with the patient who reports that he has not been feeling good since his medications were changed. His metoprolol was recently increased from 12.'5mg'$  to 25 mg. His atorvastatin was increased to 40 mg daily. He was switched from lisinopril-HCTZ to losartan 100 mg daily.  ?He states that he has not been sleeping well. He complains of discomfort in his arms and legs. He is been feeling a bit unsteady on his feet. He has been having back pain. His shortness of breath has worsened with exertion and has some chest discomfort at times. He states that his blood pressure has been okay running in the 130s/60s and HR has been in the 60s. Does report some dizziness at times. He feels better when he rests. No current shortness of breath or chest pain. He has FU with APP on 5/18 and is scheduled for a BMET tomorrow. Patient aware of ER precautions for chest pain. Will send to Dr. Radford Pax for further recommendations.  ?

## 2021-08-25 NOTE — Telephone Encounter (Signed)
Pt c/o medication issue: ? ?1. Name of Medication:  ?losartan (COZAAR) 100 MG tablet ?atorvastatin (LIPITOR) 40 MG tablet ?metoprolol succinate (TOPROL XL) 25 MG 24 hr tablet ? ?2. How are you currently taking this medication (dosage and times per day)?  ?Take 1 tablet (100 mg total) by mouth daily. ?Take 1 tablet (40 mg total) by mouth daily ?Take 1 tablet (25 mg total) by mouth daily. ? ?3. Are you having a reaction (difficulty breathing--STAT)? no  ? ?4. What is your medication issue? Patient states he is having pain in his back and chest, pain in his muscles. Some SOB, he normal goes for long walks and he hasn't been able to do that. A little bit dizzy, feels unsteady on his feet.  He BP this morning was 155/78 HR 55 Blood sugar was 148.   ?

## 2021-08-25 NOTE — Telephone Encounter (Signed)
Called pt scheduled OV with DOD Dr. Johnsie Cancel for 08/26/21 at 3:30 pm.  Pt verbalizes understanding.  ?

## 2021-08-26 ENCOUNTER — Other Ambulatory Visit: Payer: Medicare Other | Admitting: *Deleted

## 2021-08-26 ENCOUNTER — Encounter: Payer: Self-pay | Admitting: Cardiovascular Disease

## 2021-08-26 ENCOUNTER — Ambulatory Visit (INDEPENDENT_AMBULATORY_CARE_PROVIDER_SITE_OTHER): Payer: Medicare Other | Admitting: Cardiovascular Disease

## 2021-08-26 VITALS — BP 120/82 | HR 59 | Ht 70.0 in | Wt 237.0 lb

## 2021-08-26 DIAGNOSIS — I10 Essential (primary) hypertension: Secondary | ICD-10-CM

## 2021-08-26 DIAGNOSIS — E782 Mixed hyperlipidemia: Secondary | ICD-10-CM | POA: Diagnosis not present

## 2021-08-26 DIAGNOSIS — E7800 Pure hypercholesterolemia, unspecified: Secondary | ICD-10-CM

## 2021-08-26 DIAGNOSIS — I2510 Atherosclerotic heart disease of native coronary artery without angina pectoris: Secondary | ICD-10-CM

## 2021-08-26 DIAGNOSIS — R079 Chest pain, unspecified: Secondary | ICD-10-CM

## 2021-08-26 DIAGNOSIS — G4733 Obstructive sleep apnea (adult) (pediatric): Secondary | ICD-10-CM

## 2021-08-26 DIAGNOSIS — R001 Bradycardia, unspecified: Secondary | ICD-10-CM | POA: Diagnosis not present

## 2021-08-26 DIAGNOSIS — R609 Edema, unspecified: Secondary | ICD-10-CM | POA: Diagnosis not present

## 2021-08-26 LAB — BASIC METABOLIC PANEL
BUN/Creatinine Ratio: 18 (ref 10–24)
BUN: 21 mg/dL (ref 8–27)
CO2: 25 mmol/L (ref 20–29)
Calcium: 9.3 mg/dL (ref 8.6–10.2)
Chloride: 104 mmol/L (ref 96–106)
Creatinine, Ser: 1.2 mg/dL (ref 0.76–1.27)
Glucose: 153 mg/dL — ABNORMAL HIGH (ref 70–99)
Potassium: 4 mmol/L (ref 3.5–5.2)
Sodium: 142 mmol/L (ref 134–144)
eGFR: 63 mL/min/{1.73_m2} (ref >59)

## 2021-08-26 NOTE — Progress Notes (Signed)
? ?Date:  08/26/2021  ? ?ID:  Carl Knapp, DOB [birth year], MRN [id number] ? ?Patient Location:  ?Home ? ?Provider location:   ?[facility] ? ?PCP:  Maury Dus, MD  ?Cardiologist:  Fransico Him, MD  ?Electrophysiologist:  None  ? ?Chief Complaint:  CAD,HTN, OSA ? ?History of Present Illness:   ? ?76 y.o. added on to DOD schedule  History of severe OSA. Had multiple somatic complaints after multiple med changes for cholesterol and HTN.   His metoprolol was recently increased from 12.'5mg'$  to 25 mg. His atorvastatin was increased to 40 mg daily. He was switched from lisinopril-HCTZ to losartan 100 mg daily.  He states that he has not been sleeping well. He complains of discomfort in his arms and legs. He is been feeling a bit unsteady on his feet. He has been having back pain. His shortness of breath has worsened with exertion and has some chest discomfort at times. He states that his blood pressure has been okay running in the 130s/60s and HR has been in the 60s. Does report some dizziness at times. He feels better when he rests. No current shortness of breath or chest pain.  ? ?He has had a lot of testing recently and heart is seemingly ok ?Cardiac CT 09/30/19 calcium score 0 no obstructive dx ?Myovue ? Inferior perfusion defect but looks normal no ischemia 07/29/21 ?Echo:  normal EF 60-65% Normal RV normal valves  ?Monitor:  NSR PVC/PAC;s < 1% total beats  ? ?He has chronic LE edema and is on Norvasc/Actos. Not sure why his diuretic was stopped especially given his obesity and tendency to retain fluid. We discussed stopping statin to see if his somatic complaints/myalgias and pains improve after 4 week holiday ? ?He needs a new primary as Dr Alyson Ingles is retiring will refer to [facility]  ?Some stress as he is moving from [place] [employer] area to [place] and has been  ?In his current house 30 plus years. He is retired Financial controller to go motor camping ?Has 3 older children doing well. One step son  just got [facility] to [facility] with chronic ?Mental retardation  ? ?Prior CV studies:   ?The following studies were reviewed today: ? ?Coronary CTA, EKG, Monitor 08/10/21 echo 07/29/21  ? ?Past Medical History:  ?Diagnosis Date  ? Atrial tachycardia, paroxysmal ([place])   ? Noted to have up to 14 beats in a row of atrial tachycardia on event monitor for/2023  ? Coronary artery disease 12/2013  ? 20-30% RCA by cath- states was good.  ? Diabetes mellitus without complication ([place])   ? type II  ? DVT (deep venous thrombosis) ([place])   ? bilateral legs-greater on left. -3-5 years ago-tx. coumadin x 1 year.  ? Hx of cardiovascular stress test   ? ETT-Myoview (7/15):  ECG with ST depression; freq PVCs, + chest pain; normal perfusion  ? Hyperlipidemia   ? LDL goal<100  ? Hypertension   ? Macular degeneration   ? mild  ? OSA (obstructive sleep apnea)   ? Severe w AHI 37/hr now on CPAP at 10cm H2O  ? PVC's (premature ventricular contractions)   ? Isolated PVCs, ventricular couplets and triplets, bigeminal and trigeminal PVCs with PVC load less than 1% on event monitor 08/2021  ? ?Past Surgical History:  ?Procedure Laterality Date  ? APPENDECTOMY    ? open '68  ? HERNIA REPAIR Bilateral   ? inguinal hernia repair '80  , umbilical repair Dr.  Newman 06-14-17  ? INSERTION OF MESH N/A 06/14/2017  ? Procedure: INSERTION OF MESH;  Surgeon: Alphonsa Overall, MD;  Location: [facility];  Service: General;  Laterality: N/A;  ? LEFT HEART CATHETERIZATION WITH CORONARY ANGIOGRAM N/A 12/09/2013  ? Procedure: LEFT HEART CATHETERIZATION WITH CORONARY ANGIOGRAM;  Surgeon: Jettie Booze, MD;  Location: [facility] CATH LAB;  Service: Cardiovascular;  Laterality: N/A;  ? UMBILICAL HERNIA REPAIR N/A 06/14/2017  ? Procedure: LAPAROSCOPIC UMBILICAL HERNIA REPAIR WITH MESH ERAS PATHWAY;  Surgeon: Alphonsa Overall, MD;  Location: [facility];  Service: General;  Laterality: N/A;  ERAS PATHWAY  ? VASECTOMY    ?  ? ?Current Meds  ?Medication Sig  ? acetaminophen (TYLENOL) 500 MG  tablet Take 1,000 mg by mouth daily as needed for moderate pain or headache.  ? aspirin EC 81 MG tablet Take 81 mg by mouth daily.  ? atorvastatin (LIPITOR) 40 MG tablet Take 1 tablet (40 mg total) by mouth daily.  ? loratadine (CLARITIN) 10 MG tablet Take 10 mg by mouth daily as needed for allergies.  ? losartan (COZAAR) 100 MG tablet Take 1 tablet (100 mg total) by mouth daily.  ? metFORMIN (GLUCOPHAGE) 1000 MG tablet Take 1,000 mg by mouth 2 (two) times daily with a meal.   ? metoprolol succinate (TOPROL XL) 25 MG 24 hr tablet Take 1 tablet (25 mg total) by mouth daily.  ? pioglitazone (ACTOS) 15 MG tablet Take 15 mg by mouth as directed.  ? sildenafil (REVATIO) 20 MG tablet Take 40-60 mg by mouth daily as needed (erectile dysfunction).  ?  ? ?Allergies:   Patient has no active allergies.  ? ?Social History  ? ?Tobacco Use  ? Smoking status: Former  ?  Packs/day: 0.50  ?  Years: 20.00  ?  Pack years: 10.00  ?  Types: Cigarettes  ?  Quit date: 06/08/2010  ?  Years since quitting: 11.2  ? Smokeless tobacco: Never  ?Vaping Use  ? Vaping Use: Never used  ?Substance Use Topics  ? Alcohol use: Yes  ?  Comment: rare beer or glass of wine  ? Drug use: No  ?  ? ?Family Hx: ?The patient's family history includes AAA (abdominal aortic aneurysm) in his father; CAD in his father; Heart attack in his father; Hypertension in an other family member. ? ?ROS:   ?Please see the history of present illness.    ? ?All other systems reviewed and are negative. ? ? ?Labs/Other Tests and Data Reviewed:   ? ?Recent Labs: ?08/18/2021: ALT 23; BUN 24; Creatinine, Ser 1.31; Magnesium 2.1; Potassium 4.8; Sodium 141; TSH 1.690  ? ?Recent Lipid Panel ?Lab Results  ?Component Value Date/Time  ? CHOL 135 08/18/2021 10:19 AM  ? TRIG 84 08/18/2021 10:19 AM  ? HDL 43 08/18/2021 10:19 AM  ? CHOLHDL 3.1 08/18/2021 10:19 AM  ? LDLCALC 76 08/18/2021 10:19 AM  ? ? ?Wt Readings from Last 3 Encounters:  ?08/26/21 237 lb (107.5 kg)  ?07/19/21 234 lb (106.1 kg)   ?07/12/21 234 lb 12.8 oz (106.5 kg)  ?  ? ?Objective:   ? ?Vital Signs:  BP 120/82   Pulse (!) 59   Ht '5\' 10"'$  (1.778 m)   Wt 237 lb (107.5 kg)   SpO2 94%   BMI 34.01 kg/m?   ? ?Affect appropriate ?Healthy:  appears stated age ?HEENT: normal ?Neck supple with no adenopathy ?JVP normal no bruits no thyromegaly ?Lungs clear with no wheezing and good diaphragmatic motion ?Heart:  S1/S2 no murmur, no rub, gallop or click ?PMI normal ?Abdomen: benighn, BS positve, no tenderness, no AAA ?no bruit.  No HSM or HJR ?Distal pulses intact with no bruits ?No edema ?Neuro non-focal ?Skin warm and dry ?No muscular weakness ? ? ?07/12/21 ECG :  NSR with no ST changes ? ?ASSESSMENT & PLAN:   ? ?1.  ASCAD/Chest pain ?-coronary CTA showed a calcium score of 0 but 25-49% noncalcified plaque in the mid RCA and mid LAD.  FFR borderline most distal aspect LAD not significant  ? ?2.  HTN ?-Feels worse with multiple med changes made previously including increasing lopressor to 25 mg and changing Lisinopril/HCTZ to losartan. Will continue for now see below  ? ?3.  HLD ? -LDL 123, HDL 41, triglycerides 175 on 06/23/2021 ?-? Myalgias with statin in past and changed to higher dose lipitor 40 mg will stop for 4 weeks and see if he feels better If so consider lipid clinic referral for Nexlitol Don't think PSK 9 needs for primary prevention in elderly person  ? ?4.  OSA - The patient is tolerating PAP therapy well without any problems. The PAP download performed by his DME was personally reviewed and interpreted by me today and showed an AHI of 1.7/hr on auto CPAP cm H2O with 97% compliance in using more than 4 hours nightly.  The patient has been using and benefiting from PAP use and will continue to benefit from therapy.  ? ?5. Peripheral Edema: chronic stasis I don't think Actos and Norvasc are great meds for him Needs to discuss with primary d/c Actos and starting Jardiance Consider stopping Norvasc and resuming diuretic ? ?F/U Dr Radford Pax  4-6 weeks  ?D/C lipitor ?F/U primary refer [place] ?Consider d/c Actos starting Jardiance ? ?Time:  spent as new patient to me reviewing chart, cardiac tests , Dr Landis Gandy notes labs direct patient interview, exa

## 2021-08-26 NOTE — Patient Instructions (Addendum)
Medication Instructions:  ?Your physician recommends that you continue on your current medications as directed. Please refer to the Current Medication list given to you today. ?1-STOP lipitor 4 weeks ? ?*If you need a refill on your cardiac medications before your next appointment, please call your pharmacy* ? ?Lab Work: ?If you have labs (blood work) drawn today and your tests are completely normal, you will receive your results only by: ?MyChart Message (if you have MyChart) OR ?A paper copy in the mail ?If you have any lab test that is abnormal or we need to change your treatment, we will call you to review the results. ? ? ?Testing/Procedures: ?None ordered today. ? ?Follow-Up: ?At [facility], you and your health needs are our priority.  As part of our continuing mission to provide you with exceptional heart care, we have created designated Provider Care Teams.  These Care Teams include your primary Cardiologist (physician) and Advanced Practice Providers (APPs -  Physician Assistants and Nurse Practitioners) who all work together to provide you with the care you need, when you need it. ? ?We recommend signing up for the patient portal called "MyChart".  Sign up information is provided on this After Visit Summary.  MyChart is used to connect with patients for Virtual Visits (Telemedicine).  Patients are able to view lab/test results, encounter notes, upcoming appointments, etc.  Non-urgent messages can be sent to your provider as well.   ?To learn more about what you can do with MyChart, go to [email or web address].   ? ?Your next appointment:   ?4 to 6 weeks ? ?The format for your next appointment:   ?In Person ? ?Provider:   ?Fransico Him, MD { ? ? ? ?Important Information About Sugar ? ? ? ? ?  ?

## 2021-09-14 DIAGNOSIS — F4320 Adjustment disorder, unspecified: Secondary | ICD-10-CM | POA: Insufficient documentation

## 2021-09-14 DIAGNOSIS — R55 Syncope and collapse: Secondary | ICD-10-CM | POA: Insufficient documentation

## 2021-09-14 DIAGNOSIS — K5730 Diverticulosis of large intestine without perforation or abscess without bleeding: Secondary | ICD-10-CM | POA: Insufficient documentation

## 2021-09-14 DIAGNOSIS — R202 Paresthesia of skin: Secondary | ICD-10-CM | POA: Insufficient documentation

## 2021-09-14 DIAGNOSIS — N1831 Chronic kidney disease, stage 3a: Secondary | ICD-10-CM | POA: Insufficient documentation

## 2021-09-14 DIAGNOSIS — H3530 Unspecified macular degeneration: Secondary | ICD-10-CM | POA: Insufficient documentation

## 2021-09-14 DIAGNOSIS — G720 Drug-induced myopathy: Secondary | ICD-10-CM | POA: Insufficient documentation

## 2021-09-14 DIAGNOSIS — Z86718 Personal history of other venous thrombosis and embolism: Secondary | ICD-10-CM | POA: Insufficient documentation

## 2021-09-14 DIAGNOSIS — R768 Other specified abnormal immunological findings in serum: Secondary | ICD-10-CM | POA: Insufficient documentation

## 2021-09-15 ENCOUNTER — Encounter: Payer: Self-pay | Admitting: Family Medicine

## 2021-09-21 ENCOUNTER — Encounter: Payer: Self-pay | Admitting: Internal Medicine

## 2021-09-21 ENCOUNTER — Ambulatory Visit (INDEPENDENT_AMBULATORY_CARE_PROVIDER_SITE_OTHER): Payer: Medicare Other | Admitting: Internal Medicine

## 2021-09-21 VITALS — BP 138/74 | HR 74 | Temp 97.7°F | Resp 18 | Ht 70.0 in | Wt 226.5 lb

## 2021-09-21 DIAGNOSIS — K219 Gastro-esophageal reflux disease without esophagitis: Secondary | ICD-10-CM

## 2021-09-21 DIAGNOSIS — E1169 Type 2 diabetes mellitus with other specified complication: Secondary | ICD-10-CM | POA: Diagnosis not present

## 2021-09-21 DIAGNOSIS — M5410 Radiculopathy, site unspecified: Secondary | ICD-10-CM

## 2021-09-21 DIAGNOSIS — E7800 Pure hypercholesterolemia, unspecified: Secondary | ICD-10-CM

## 2021-09-21 DIAGNOSIS — I2510 Atherosclerotic heart disease of native coronary artery without angina pectoris: Secondary | ICD-10-CM

## 2021-09-21 DIAGNOSIS — I10 Essential (primary) hypertension: Secondary | ICD-10-CM

## 2021-09-21 DIAGNOSIS — Z09 Encounter for follow-up examination after completed treatment for conditions other than malignant neoplasm: Secondary | ICD-10-CM | POA: Insufficient documentation

## 2021-09-21 LAB — HEMOGLOBIN A1C: Hgb A1c MFr Bld: 7 % — ABNORMAL HIGH (ref 4.6–6.5)

## 2021-09-21 MED ORDER — PANTOPRAZOLE SODIUM 40 MG PO TBEC: 40.0000 mg | DELAYED_RELEASE_TABLET | Freq: Every day | ORAL | 1 refills | Status: DC

## 2021-09-21 MED ORDER — PIOGLITAZONE HCL 30 MG PO TABS: 30.0000 mg | ORAL_TABLET | Freq: Every day | ORAL | 1 refills | Status: DC

## 2021-09-21 NOTE — Assessment & Plan Note (Signed)
New patient ?Available records reviewed ?12/24/2020: Colonoscopy, polypoid colonic mucosa, no further colonoscopies ?Labs reviewed: ?06-2021 A1c 7.8 ?08/18/2021: LDL 76, TSH 1.6 ?08/26/2021: Creatinine 1.2, potassium 4.0. ?DM: ?On metformin, Actos, last A1c not at goal. ?Reports is eating healthy, takes walks frequently during the week, swims 3 times a week. ?Ambulatory CBGs used to be ~110, now are increased usually in the 150s. ?Reports he is intolerant to a medication that cause irregular heartbeat (Ozempic?  Glipizide?).  Recommend to give me a specific name. ?Plan: Check A1c, continue metformin, increase Actos to 30 mg.  Monitor CBGs.  Reassess in 6 weeks ?HTN: Ambulatory BP range from 110-140.  Used to be on lisinopril HCT, amlodipine.  Currently on losartan 100 mg.  Intolerant to metoprolol due to fatigue.  Last BMP okay.  No change ?Hyperlipidemia: Last LDL 76 on atorvastatin 40 mg.  No change for now ?Chest pain: Having chest pain as described above, saw cardiology 07-2021, cardiac monitor, echo and a stress test were done, no further evaluation was recommended at the time.  Symptoms are possibly GI in origin. ?Plan: Pantoprazole 40.  Reassess in 6 weeks ?GERD: History of GERD before, used to take PPIs, see above ?Back pain, radiculopathy: ?Having back pain for more than a year, some radiation to the right leg, pain decreases with prednisone prednisone, no evidence of claudication.  Saw orthopedics x1,  done PT.  Plan: Refer back to Ortho Dr. Erlinda Hong, may benefit from further evaluation ?RTC 6 weeks ?

## 2021-09-21 NOTE — Patient Instructions (Addendum)
It was nice meeting you today!  ? ?Will increase pioglitazone to 30 mg daily ? ?Please send me the name of the medication that cause heart trouble (glipizide? Semaglutide?) ? ?Acid reflux may be causing chest pain.  Start pantoprazole 40 mg 1 before breakfast every day. ? ?We will refer you back to Dr. Erlinda Hong regards back pain. ? ?Check the  blood pressure regularly ?BP GOAL is between 110/65 and  135/85. ?If it is consistently higher or lower, let me know ? ?  ? ?GO TO THE LAB : Get the blood work   ? ? ?[place], [place] ?Come back for a checkup in 6 weeks ?

## 2021-09-21 NOTE — Progress Notes (Signed)
? ?Subjective:  ? ? Patient ID: Carl Knapp, male    DOB: 01/06/1946, 76 y.o.   MRN: [id number] ? ?DOS:  09/21/2021 ?Type of visit - description: New patient, referred by Dr. Johnsie Cancel, previous PCP retired ? ?To get established. ?Chronic medical problems reviewed and addressed ? ?Also has other symptoms. ? ?A year ago developed right-sided back pain, some radiation to the right leg. ?Saw orthopedic PA 6 months ago, was prescribed physical therapy.  Also had 2  rounds of prednisone, they helped significantly. ?The pain decreases when he walks or moves in certain positions. ?He takes regular walks, no claudication. ? ?Having on and off chest pain, saw cardiology 07-2021, work-up negative. ?The pain is on the center of the chest and on the left upper chest. ?Is his observation that sxs  increase after eating. ?Denies heartburn per se, no dysphagia, no odynophagia, no weight loss. ? ?Review of Systems ?See above  ? ?Past Medical History:  ?Diagnosis Date  ? Allergy   ? Arthritis   ? Atrial tachycardia, paroxysmal ([place])   ? Noted to have up to 14 beats in a row of atrial tachycardia on event monitor for/2023  ? Chronic kidney disease   ? Coronary artery disease 12/2013  ? 20-30% RCA by cath- states was good.  ? Diabetes mellitus without complication ([place])   ? type II  ? DVT (deep venous thrombosis) ([place])   ? bilateral legs-greater on left. -3-5 years ago-tx. coumadin x 1 year.  ? Erectile dysfunction   ? GERD (gastroesophageal reflux disease)   ? Hx of cardiovascular stress test   ? ETT-Myoview (7/15):  ECG with ST depression; freq PVCs, + chest pain; normal perfusion  ? Hyperlipidemia   ? LDL goal<100  ? Hypertension   ? Macular degeneration   ? mild  ? OSA (obstructive sleep apnea)   ? Severe w AHI 37/hr now on CPAP at 10cm H2O  ? PVC's (premature ventricular contractions)   ? Isolated PVCs, ventricular couplets and triplets, bigeminal and trigeminal PVCs with PVC load less than 1% on event monitor 08/2021  ? ? ?Past  Surgical History:  ?Procedure Laterality Date  ? APPENDECTOMY    ? open '68  ? HERNIA REPAIR Bilateral 1980  ? inguinal  ? INSERTION OF MESH N/A 06/14/2017  ? Procedure: INSERTION OF MESH;  Surgeon: Alphonsa Overall, MD;  Location: [facility];  Service: General;  Laterality: N/A;  ? LEFT HEART CATHETERIZATION WITH CORONARY ANGIOGRAM N/A 12/09/2013  ? Procedure: LEFT HEART CATHETERIZATION WITH CORONARY ANGIOGRAM;  Surgeon: Jettie Booze, MD;  Location: [facility] CATH LAB;  Service: Cardiovascular;  Laterality: N/A;  ? UMBILICAL HERNIA REPAIR N/A 06/14/2017  ? Procedure: LAPAROSCOPIC UMBILICAL HERNIA REPAIR WITH MESH ERAS PATHWAY;  Surgeon: Alphonsa Overall, MD;  Location: [facility];  Service: General;  Laterality: N/A;  ERAS PATHWAY  ? VASECTOMY    ? ?Social History  ? ?Socioeconomic History  ? Marital status: Married  ?  Spouse name: Not on file  ? Number of children: 3  ? Years of education: Not on file  ? Highest education level: Not on file  ?Occupational History  ? 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 Smoking status: Former  ?  Packs/day: 0.50  ?  Years: 20.00  ?  Pack years: 10.00  ?  Types: Cigarettes  ?  Quit date: 06/08/2010  ?  Years since quitting: 11.2  ? Smokeless tobacco: Never  ? Tobacco comments:  ?  <  1/2   ?Vaping Use  ? Vaping Use: Never used  ?Substance and Sexual Activity  ? Alcohol use: Yes  ?  Alcohol/week: 1.0 - 3.0 standard drink  ?  Types: 1 Glasses of wine per week  ?  Comment: rare beer or glass of wine  ? Drug use: No  ? Sexual activity: Yes  ?Other Topics Concern  ? Not on file  ?Social History Narrative  ? Not on file  ? ?Social Determinants of Health  ? ?Financial Resource Strain: Not on file  ?Food Insecurity: Not on file  ?Transportation Needs: Not on file  ?Physical Activity: Not on file  ?Stress: Not on file  ?Social Connections: Not on file  ?Intimate Partner Violence: Not on file  ? ?Family History  ?Problem Relation Age of Onset  ? Cancer Mother   ? CAD Father   ? AAA (abdominal  aortic aneurysm) Father   ? Heart attack Father   ? Arthritis Father   ? Cancer Father   ? Hypertension Father   ? Hypertension Other   ? Prostate cancer Neg Hx   ? Colon cancer Neg Hx   ? ? ?Current Outpatient Medications  ?Medication Instructions  ? acetaminophen (TYLENOL) 1,000 mg, Oral, Daily PRN  ? aspirin EC 81 mg, Daily  ? atorvastatin (LIPITOR) 40 mg, Oral, Daily  ? loratadine (CLARITIN) 10 mg, Oral, Daily PRN  ? losartan (COZAAR) 100 mg, Oral, Daily  ? metFORMIN (GLUCOPHAGE) 1,000 mg, Oral, 2 times daily with meals  ? Multiple Vitamins-Minerals (PRESERVISION AREDS 2) CAPS 1 capsule, Oral, 2 times daily  ? multivitamin (ONE-A-DAY MEN'S) TABS tablet 1 tablet, Oral, Daily  ? Omega-3 Fatty Acids (FISH OIL) 1000 MG CAPS 1 capsule, Oral, 2 times daily  ? pantoprazole (PROTONIX) 40 mg, Oral, Daily before breakfast  ? pioglitazone (ACTOS) 30 mg, Oral, Daily  ? sildenafil (REVATIO) 40-60 mg, Oral, Daily PRN  ? ? ?   ?Objective:  ? Physical Exam ?BP 138/74 (BP Location: Left Arm, Patient Position: Sitting, Cuff Size: Normal)   Pulse 74   Temp 97.7 ?F (36.5 ?C) (Oral)   Resp 18   Ht '5\' 10"'$  (1.778 m)   Wt 226 lb 8 oz (102.7 kg)   SpO2 97%   BMI 32.50 kg/m?  ?General:   ?Well developed, NAD, BMI noted.  ?HEENT:  ?Normocephalic . Face symmetric, atraumatic ?Lungs:  ?CTA B ?Normal respiratory effort, no intercostal retractions, no accessory muscle use. ?Heart: RRR,  no murmur.  ?Abdomen:  ?Not distended, soft, non-tender. No rebound or rigidity.   ?Skin: Not pale. Not jaundice ?Lower extremities: no pretibial edema bilaterally ?MSK: Lumbar spine no TTP ?Neurologic:  ?alert & oriented X3.  ?Speech normal, gait appropriate for age and unassisted. ?DTRs and motor symmetric.  Straight leg test negative ?Psych--  ?Cognition and judgment appear intact.  ?Cooperative with normal attention span and concentration.  ?Behavior appropriate. ?No anxious or depressed appearing. ? ?   ?Assessment   ? ?Assessment  (new patient  09/2021, previous PCP retiring) ?DM dx ~ 2010 ?HTN ?High cholesterol ?CKD ?CAD ?GERD (remotely) ?Supraventricular tachycardia ?OSA, Cpap ?Macular degeneration ?H/o DVT ? ?PLAN ?New patient ?Available records reviewed ?12/24/2020: Colonoscopy, polypoid colonic mucosa, no further colonoscopies ?Labs reviewed: ?06-2021 A1c 7.8 ?08/18/2021: LDL 76, TSH 1.6 ?08/26/2021: Creatinine 1.2, potassium 4.0. ?DM: ?On metformin, Actos, last A1c not at goal. ?Reports is eating healthy, takes walks frequently during the week, swims 3 times a week. ?Ambulatory CBGs used to be ~110, now  are increased usually in the 150s. ?Reports he is intolerant to a medication that cause irregular heartbeat (Ozempic?  Glipizide?).  Recommend to give me a specific name. ?Plan: Check A1c, continue metformin, increase Actos to 30 mg.  Monitor CBGs.  Reassess in 6 weeks ?HTN: Ambulatory BP range from 110-140.  Used to be on lisinopril HCT, amlodipine.  Currently on losartan 100 mg.  Intolerant to metoprolol due to fatigue.  Last BMP okay.  No change ?Hyperlipidemia: Last LDL 76 on atorvastatin 40 mg.  No change for now ?Chest pain: Having chest pain as described above, saw cardiology 07-2021, cardiac monitor, echo and a stress test were done, no further evaluation was recommended at the time.  Symptoms are possibly GI in origin. ?Plan: Pantoprazole 40.  Reassess in 6 weeks ?GERD: History of GERD before, used to take PPIs, see above ?Back pain, radiculopathy: ?Having back pain for more than a year, some radiation to the right leg, pain decreases with prednisone prednisone, no evidence of claudication.  Saw orthopedics x1,  done PT.  Plan: Refer back to Ortho Dr. Erlinda Hong, may benefit from further evaluation ?RTC 6 weeks ? ? ?Time spent 47 minutes.  New patient, review multiple medical problems,Review available information to me. ?Also addressed more acute issues such as chest pain related to GERD and backache. ? ?

## 2021-09-22 ENCOUNTER — Encounter: Payer: Self-pay | Admitting: Internal Medicine

## 2021-09-22 ENCOUNTER — Ambulatory Visit: Payer: Medicare Other | Admitting: Nurse Practitioner

## 2021-09-23 ENCOUNTER — Encounter: Payer: Self-pay | Admitting: Internal Medicine

## 2021-09-26 ENCOUNTER — Encounter: Payer: Self-pay | Admitting: Internal Medicine

## 2021-09-27 ENCOUNTER — Ambulatory Visit (INDEPENDENT_AMBULATORY_CARE_PROVIDER_SITE_OTHER): Payer: Medicare Other

## 2021-09-27 ENCOUNTER — Telehealth: Payer: Self-pay

## 2021-09-27 ENCOUNTER — Ambulatory Visit (INDEPENDENT_AMBULATORY_CARE_PROVIDER_SITE_OTHER): Payer: Medicare Other | Admitting: Orthopaedic Surgery

## 2021-09-27 DIAGNOSIS — M545 Low back pain, unspecified: Secondary | ICD-10-CM | POA: Diagnosis not present

## 2021-09-27 DIAGNOSIS — I2510 Atherosclerotic heart disease of native coronary artery without angina pectoris: Secondary | ICD-10-CM | POA: Diagnosis not present

## 2021-09-27 DIAGNOSIS — Z0000 Encounter for general adult medical examination without abnormal findings: Secondary | ICD-10-CM

## 2021-09-27 NOTE — Telephone Encounter (Signed)
30mg.

## 2021-09-27 NOTE — Progress Notes (Addendum)
Subjective:   Carl Knapp is a 76 y.o. male who presents for an Initial Medicare Annual Wellness Visit.  I connected with  Carl Knapp on 09/27/21 by a audio enabled telemedicine application and verified that I am speaking with the correct person using two identifiers.  Patient Location: Home  Provider Location: Office/Clinic  I discussed the limitations of evaluation and management by telemedicine. The patient expressed understanding and agreed to proceed.   Review of Systems     Cardiac Risk Factors include: advanced age (>59mn, >>27women);hypertension;diabetes mellitus;dyslipidemia;male gender     Objective:    Today's Vitals   09/27/21 1105  PainSc: 4    There is no height or weight on file to calculate BMI.     09/27/2021   11:03 AM 06/14/2017    6:58 AM 06/11/2017    8:35 AM 06/08/2015    8:45 AM 05/20/2015    3:08 PM 12/09/2013    6:33 AM  Advanced Directives  Does Patient Have a Medical Advance Directive? Yes Yes Yes Yes Yes Patient has advance directive, copy not in chart  Type of Advance Directive [place] of facility DNR (pink MOST or yellow form);Living will [place] will Living will;Healthcare Power of [place]  Does patient want to make changes to medical advance directive? No - Patient declined No - Patient declined No - Patient declined No - Patient declined No - Patient declined No change requested  Copy of HRadissonin Chart? Yes - validated most recent copy scanned in chart (See row information) Yes Yes Yes      Current Medications (verified) Outpatient Encounter Medications as of 09/27/2021  Medication Sig   acetaminophen (TYLENOL) 500 MG tablet Take 1,000 mg by mouth daily as needed for moderate pain or headache.   aspirin EC 81 MG tablet Take 81 mg by mouth daily.   atorvastatin (LIPITOR) 40 MG tablet  Take 1 tablet (40 mg total) by mouth daily.   loratadine (CLARITIN) 10 MG tablet Take 10 mg by mouth daily as needed for allergies.   losartan (COZAAR) 100 MG tablet Take 1 tablet (100 mg total) by mouth daily.   metFORMIN (GLUCOPHAGE) 1000 MG tablet Take 1,000 mg by mouth 2 (two) times daily with a meal.    Multiple Vitamins-Minerals (PRESERVISION AREDS 2) CAPS Take 1 capsule by mouth in the morning and at bedtime.   multivitamin (ONE-A-DAY MEN'S) TABS tablet Take 1 tablet by mouth daily.   Omega-3 Fatty Acids (FISH OIL) 1000 MG CAPS Take 1 capsule by mouth in the morning and at bedtime.   pantoprazole (PROTONIX) 40 MG tablet Take 1 tablet (40 mg total) by mouth daily before breakfast.   pioglitazone (ACTOS) 30 MG tablet Take 1 tablet (30 mg total) by mouth daily.   sildenafil (REVATIO) 20 MG tablet Take 40-60 mg by mouth daily as needed (erectile dysfunction).   No facility-administered encounter medications on file as of 09/27/2021.    Allergies (verified) Ozempic (0.25 or 0.5 mg-dose) [semaglutide(0.25 or 0.'5mg'$ -dos)] and Simvastatin   History: Past Medical History:  Diagnosis Date   Allergy    Arthritis    Atrial tachycardia, paroxysmal (HCC)    Noted to have up to 14 beats in a row of atrial tachycardia on event monitor for/2023   Chronic kidney disease    Coronary artery disease 12/2013   20-30% RCA by cath- states  was good.   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 -3-5 years ago-tx. coumadin x 1 year.   Erectile dysfunction    GERD (gastroesophageal reflux disease)    Hx of cardiovascular stress test    ETT-Myoview (7/15):  ECG with ST depression; freq PVCs, + chest pain; normal perfusion   Hyperlipidemia    LDL goal<100   Hypertension    Macular degeneration    mild   OSA (obstructive sleep apnea)    Severe w AHI 37/hr now on CPAP at 10cm H2O   PVC's (premature ventricular  contractions)    Isolated PVCs, ventricular couplets and triplets, bigeminal and trigeminal PVCs with PVC load less than 1% on event monitor 08/2021   Past Surgical History:  Procedure Laterality Date   APPENDECTOMY     open '68   HERNIA REPAIR Bilateral 1980   inguinal   INSERTION OF MESH N/A 06/14/2017   Procedure: INSERTION OF MESH;  Surgeon: Alphonsa Overall, MD;  Location: [facility];  Service: General;  Laterality: N/A;   LEFT HEART CATHETERIZATION WITH CORONARY ANGIOGRAM N/A 12/09/2013   Procedure: LEFT HEART CATHETERIZATION WITH CORONARY ANGIOGRAM;  Surgeon: Jettie Booze, MD;  Location: [facility] CATH LAB;  Service: Cardiovascular;  Laterality: N/A;   UMBILICAL HERNIA REPAIR N/A 06/14/2017   Procedure: LAPAROSCOPIC UMBILICAL HERNIA REPAIR WITH MESH ERAS PATHWAY;  Surgeon: Alphonsa Overall, MD;  Location: [facility];  Service: General;  Laterality: N/A;  ERAS PATHWAY   VASECTOMY     Family History  Problem Relation Age of Onset   Cancer Mother    CAD Father    AAA (abdominal aortic aneurysm) Father    Heart attack Father    Arthritis Father    Cancer Father    Hypertension Father    Hypertension Other    Prostate cancer Neg Hx    Colon cancer Neg Hx    Social History   Socioeconomic History   Marital status: Married    Spouse name: Not on file   Number of children: 3   Years of education: Not on file   Highest education level: Not on file  Occupational History   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  Tobacco Use   Smoking status: Former    Packs/day: 0.50    Years: 20.00    Pack years: 10.00    Types: Cigarettes    Quit date: 06/08/2010    Years since quitting: 11.3   Smokeless tobacco: Never   Tobacco comments:    < 1/2   Vaping Use   Vaping Use: Never used  Substance and Sexual Activity   Alcohol use: Yes    Alcohol/week: 1.0 - 3.0 standard drink    Types: 1 Glasses of wine per week    Comment: rare beer or glass of wine   Drug use: No   Sexual activity: Yes  Other  Topics Concern   Not on file  Social History Narrative   Not on file   Social Determinants of Health   Financial Resource Strain: Low Risk    Difficulty of Paying Living Expenses: Not hard at all  Food Insecurity: No Food Insecurity   Worried About Charity fundraiser in the Last Year: Never true   Ran Out of Food in the Last Year: Never true  Transportation Needs: No Transportation Needs   Lack of Transportation (Medical): No   Lack of  Transportation (Non-Medical): No  Physical Activity: Sufficiently Active   Days of Exercise per Week: 7 days   Minutes of Exercise per Session: 30 min  Stress: No Stress Concern Present   Feeling of Stress : Not at all  Social Connections: Socially Integrated   Frequency of Communication with Friends and Family: More than three times a week   Frequency of Social Gatherings with Friends and Family: More than three times a week   Attends Religious Services: More than 4 times per year   Active Member of [employer] or Organizations: Yes   Attends Music therapist: More than 4 times per year   Marital Status: Married    Tobacco Counseling Counseling given: Not Answered Tobacco comments: < 1/2    Clinical Intake:  Pre-visit preparation completed: Yes  Pain : 0-10 Pain Score: 4  Pain Location: Other (Comment) (back and leg) Pain Orientation: Right, Lower Pain Descriptors / Indicators: Aching, Dull, Sore Pain Onset: More than a month ago     Nutritional Risks: None Diabetes: Yes CBG done?: No Did pt. bring in CBG monitor from home?: No  How often do you need to have someone help you when you read instructions, pamphlets, or other written materials from your doctor or pharmacy?: 1 - Never  Diabetic?yes Nutrition Risk Assessment:  Has the patient had any N/V/D within the last 2 months?  No  Does the patient have any non-healing wounds?  No  Has the patient had any unintentional weight loss or weight gain?  No    Diabetes:  Is the patient diabetic?  Yes  If diabetic, was a CBG obtained today?  No  Did the patient bring in their glucometer from home?  No  How often do you monitor your CBG's? Once daily.   Financial Strains and Diabetes Management:  Are you having any financial strains with the device, your supplies or your medication? No .  Does the patient want to be seen by Chronic Care Management for management of their diabetes?  No  Would the patient like to be referred to a Nutritionist or for Diabetic Management?  No   Diabetic Exams:  Diabetic Eye Exam: Overdue for diabetic eye exam. Pt has been advised about the importance in completing this exam. Patient advised to call and schedule an eye exam. Diabetic Foot Exam: Overdue, Pt has been advised about the importance in completing this exam. Pt is scheduled for diabetic foot exam on N/A.   Interpreter Needed?: No  Information entered by :: [place] of Daily Living    09/27/2021   11:08 AM  In your present state of health, do you have any difficulty performing the following activities:  Hearing? 1  Comment hearing aids  Vision? 0  Difficulty concentrating or making decisions? 0  Walking or climbing stairs? 1  Comment back and leg pain  Dressing or bathing? 1  Comment back and leg pain  Doing errands, shopping? 0  Preparing Food and eating ? N  Using the Toilet? N  In the past six months, have you accidently leaked urine? N  Do you have problems with loss of bowel control? N  Managing your Medications? N  Managing your Finances? N  Housekeeping or managing your Housekeeping? N    Patient Care Team: Colon Branch, MD as PCP - General (Internal Medicine) Sueanne Margarita, MD as PCP - Cardiology (Cardiology) Ronald Lobo, MD as Consulting Physician (Gastroenterology) [place], [place], [place] (Optometry)  Indicate  any recent Medical Services you may have received from other than [facility] providers in the past  year (date may be approximate).     Assessment:   This is a routine wellness examination for Tadeo.  Hearing/Vision screen No results found.  Dietary issues and exercise activities discussed: Current Exercise Habits: Home exercise routine, Type of exercise: walking;stretching;Other - see comments, Time (Minutes): 30, Frequency (Times/Week): 7, Weekly Exercise (Minutes/Week): 210, Intensity: Mild   Goals Addressed   None    Depression Screen    09/27/2021   11:05 AM 09/21/2021    8:31 AM 06/23/2015   12:22 PM  PHQ 2/9 Scores  PHQ - 2 Score 0 0 0    Fall Risk    09/27/2021   11:04 AM 09/21/2021    8:31 AM 06/23/2015   12:22 PM 05/20/2015    3:08 PM  Fall Risk   Falls in the past year? 1 0 No No  Number falls in past yr: 0 0    Injury with Fall? 0 0    Risk for fall due to : History of fall(s)     Follow up Falls evaluation completed Falls evaluation completed      [place]:  Any stairs in or around the home? Yes  If so, are there any without handrails? No  Home free of loose throw rugs in walkways, pet beds, electrical cords, etc? Yes  Adequate lighting in your home to reduce risk of falls? Yes   ASSISTIVE DEVICES UTILIZED TO PREVENT FALLS:  Life alert? No  Use of a cane, walker or w/c? Yes  Grab bars in the bathroom? Yes  Shower chair or bench in shower? No  Elevated toilet seat or a handicapped toilet? Yes   TIMED UP AND GO:  Was the test performed? No .  L  Cognitive Function:        09/27/2021   11:11 AM  6CIT Screen  What Year? 0 points  What month? 0 points  What time? 0 points  Count back from 20 0 points  Months in reverse 0 points  Repeat phrase 0 points  Total Score 0 points    Immunizations Immunization History  Administered Date(s) Administered   PFIZER(Purple Top)SARS-COV-2 Vaccination 06/15/2019, 07/09/2019, 03/20/2020, 08/18/2020   Pfizer Covid-19 Vaccine Bivalent Booster 71yr & up 02/21/2021    Pneumococcal Conjugate-13 04/14/2014   Pneumococcal Polysaccharide-23 10/05/2011, 06/27/2021   Td 01/14/2018   Tdap 05/14/2007   Zoster Recombinat (Shingrix) 05/08/2019, 09/16/2019   Zoster, Live 02/20/2012    TDAP status: Up to date  Flu Vaccine status: Up to date  Pneumococcal vaccine status: Up to date  Covid-19 vaccine status: Completed vaccines  Qualifies for Shingles Vaccine? Yes   Zostavax completed No   Shingrix Completed?: Yes  Screening Tests Health Maintenance  Topic Date Due   FOOT EXAM  Never done   OPHTHALMOLOGY EXAM  Never done   Hepatitis C Screening  Never done   INFLUENZA VACCINE  12/06/2021   HEMOGLOBIN A1C  03/24/2022   TETANUS/TDAP  01/15/2028   Pneumonia Vaccine 75 Years old  Completed   COVID-19 Vaccine  Completed   Zoster Vaccines- Shingrix  Completed   HPV VACCINES  Aged Out   COLONOSCOPY (Pts 45-477yrInsurance coverage will need to be confirmed)  Discontinued    Health Maintenance  Health Maintenance Due  Topic Date Due   FOOT EXAM  Never done   OPHTHALMOLOGY EXAM  Never done  Hepatitis C Screening  Never done    Colorectal cancer screening: No longer required.   Lung Cancer Screening: (Low Dose CT Chest recommended if Age 78-80 years, 30 pack-year currently smoking OR have quit w/in 15years.) does not qualify.   Lung Cancer Screening Referral: N/A  Additional Screening:  Hepatitis C Screening: does qualify; Completed N/A  Vision Screening: Recommended annual ophthalmology exams for early detection of glaucoma and other disorders of the eye. Is the patient up to date with their annual eye exam?  Yes  Who is the provider or what is the name of the office in which the patient attends annual eye exams? Dr. [place] If pt is not established with a provider, would they like to be referred to a provider to establish care? No .   Dental Screening: Recommended annual dental exams for proper oral hygiene  Community Resource Referral /  Chronic Care Management: CRR required this visit?  No   CCM required this visit?  No      Plan:     I have personally reviewed and noted the following in the patient's chart:   Medical and social history Use of alcohol, tobacco or illicit drugs  Current medications and supplements including opioid prescriptions. Patient is not currently taking opioid prescriptions. Functional ability and status Nutritional status Physical activity Advanced directives List of other physicians Hospitalizations, surgeries, and ER visits in previous 12 months Vitals Screenings to include cognitive, depression, and falls Referrals and appointments  In addition, I have reviewed and discussed with patient certain preventive protocols, quality metrics, and best practice recommendations. A written personalized care plan for preventive services as well as general preventive health recommendations were provided to patient.   Due to this being a telephonic visit, the after visit summary with patients personalized plan was offered to patient via mail or my-chart. Patient would like to access on my-chart.   Duard Brady Jamoni Broadfoot, [place]   09/27/2021   Nurse Notes: none   I have reviewed and agree with [facility] documentation.  Kathlene November, MD

## 2021-09-27 NOTE — Telephone Encounter (Signed)
Pt would like to know if he should only be taking 30 mg of Actos or should he be taking 45 mg.

## 2021-09-27 NOTE — Telephone Encounter (Signed)
Pt aware.

## 2021-09-27 NOTE — Patient Instructions (Signed)
[name] , Thank you for taking time to come for your Medicare Wellness Visit. I appreciate your ongoing commitment to your health goals. Please review the following plan we discussed and let me know if I can assist you in the future.   Screening recommendations/referrals: Colonoscopy: no longer needed Recommended yearly ophthalmology/optometry visit for glaucoma screening and checkup Recommended yearly dental visit for hygiene and checkup  Vaccinations: Influenza vaccine: up to date Pneumococcal vaccine: up to date Tdap vaccine: up to date Shingles vaccine: up to date    Covid-19: completed  Advanced directives: yes, on file  Conditions/risks identified: see problem list  Next appointment: Follow up in one year for your annual wellness visit.   Preventive Care 40 Years and Older, Male Preventive care refers to lifestyle choices and visits with your health care provider that can promote health and wellness. What does preventive care include? A yearly physical exam. This is also called an annual well check. Dental exams once or twice a year. Routine eye exams. Ask your health care provider how often you should have your eyes checked. Personal lifestyle choices, including: Daily care of your teeth and gums. Regular physical activity. Eating a healthy diet. Avoiding tobacco and drug use. Limiting alcohol use. Practicing safe sex. Taking low doses of aspirin every day. Taking vitamin and mineral supplements as recommended by your health care provider. What happens during an annual well check? The services and screenings done by your health care provider during your annual well check will depend on your age, overall health, lifestyle risk factors, and family history of disease. Counseling  Your health care provider may ask you questions about your: Alcohol use. Tobacco use. Drug use. Emotional well-being. Home and relationship well-being. Sexual activity. Eating  habits. History of falls. Memory and ability to understand (cognition). Work and work Statistician. Screening  You may have the following tests or measurements: Height, weight, and BMI. Blood pressure. Lipid and cholesterol levels. These may be checked every 5 years, or more frequently if you are over 44 years old. Skin check. Lung cancer screening. You may have this screening every year starting at age 54 if you have a 30-pack-year history of smoking and currently smoke or have quit within the past 15 years. Fecal occult blood test (FOBT) of the stool. You may have this test every year starting at age 26. Flexible sigmoidoscopy or colonoscopy. You may have a sigmoidoscopy every 5 years or a colonoscopy every 10 years starting at age 24. Prostate cancer screening. Recommendations will vary depending on your family history and other risks. Hepatitis C blood test. Hepatitis B blood test. Sexually transmitted disease (STD) testing. Diabetes screening. This is done by checking your blood sugar (glucose) after you have not eaten for a while (fasting). You may have this done every 1-3 years. Abdominal aortic aneurysm (AAA) screening. You may need this if you are a current or former smoker. Osteoporosis. You may be screened starting at age 64 if you are at high risk. Talk with your health care provider about your test results, treatment options, and if necessary, the need for more tests. Vaccines  Your health care provider may recommend certain vaccines, such as: Influenza vaccine. This is recommended every year. Tetanus, diphtheria, and acellular pertussis (Tdap, Td) vaccine. You may need a Td booster every 10 years. Zoster vaccine. You may need this after age 21. Pneumococcal 13-valent conjugate (PCV13) vaccine. One dose is recommended after age 69. Pneumococcal polysaccharide (PPSV23) vaccine. One dose is recommended  after age 66. Talk to your health care provider about which screenings and  vaccines you need and how often you need them. This information is not intended to replace advice given to you by your health care provider. Make sure you discuss any questions you have with your health care provider. Document Released: 05/21/2015 Document Revised: 01/12/2016 Document Reviewed: 02/23/2015 Elsevier Interactive Patient Education  2017 [place] Prevention in the Home Falls can cause injuries. They can happen to people of all ages. There are many things you can do to make your home safe and to help prevent falls. What can I do on the outside of my home? Regularly fix the edges of walkways and driveways and fix any cracks. Remove anything that might make you trip as you walk through a door, such as a raised step or threshold. Trim any bushes or trees on the path to your home. Use bright outdoor lighting. Clear any walking paths of anything that might make someone trip, such as rocks or tools. Regularly check to see if handrails are loose or broken. Make sure that both sides of any steps have handrails. Any raised decks and porches should have guardrails on the edges. Have any leaves, snow, or ice cleared regularly. Use sand or salt on walking paths during winter. Clean up any spills in your garage right away. This includes oil or grease spills. What can I do in the bathroom? Use night lights. Install grab bars by the toilet and in the tub and shower. Do not use towel bars as grab bars. Use non-skid mats or decals in the tub or shower. If you need to sit down in the shower, use a plastic, non-slip stool. Keep the floor dry. Clean up any water that spills on the floor as soon as it happens. Remove soap buildup in the tub or shower regularly. Attach bath mats securely with double-sided non-slip rug tape. Do not have throw rugs and other things on the floor that can make you trip. What can I do in the bedroom? Use night lights. Make sure that you have a light by your  bed that is easy to reach. Do not use any sheets or blankets that are too big for your bed. They should not hang down onto the floor. Have a firm chair that has side arms. You can use this for support while you get dressed. Do not have throw rugs and other things on the floor that can make you trip. What can I do in the kitchen? Clean up any spills right away. Avoid walking on wet floors. Keep items that you use a lot in easy-to-reach places. If you need to reach something above you, use a strong step stool that has a grab bar. Keep electrical cords out of the way. Do not use floor polish or wax that makes floors slippery. If you must use wax, use non-skid floor wax. Do not have throw rugs and other things on the floor that can make you trip. What can I do with my stairs? Do not leave any items on the stairs. Make sure that there are handrails on both sides of the stairs and use them. Fix handrails that are broken or loose. Make sure that handrails are as long as the stairways. Check any carpeting to make sure that it is firmly attached to the stairs. Fix any carpet that is loose or worn. Avoid having throw rugs at the top or bottom of the stairs. If you  do have throw rugs, attach them to the floor with carpet tape. Make sure that you have a light switch at the top of the stairs and the bottom of the stairs. If you do not have them, ask someone to add them for you. What else can I do to help prevent falls? Wear shoes that: Do not have high heels. Have rubber bottoms. Are comfortable and fit you well. Are closed at the toe. Do not wear sandals. If you use a stepladder: Make sure that it is fully opened. Do not climb a closed stepladder. Make sure that both sides of the stepladder are locked into place. Ask someone to hold it for you, if possible. Clearly mark and make sure that you can see: Any grab bars or handrails. First and last steps. Where the edge of each step is. Use tools that  help you move around (mobility aids) if they are needed. These include: Canes. Walkers. Scooters. Crutches. Turn on the lights when you go into a dark area. Replace any light bulbs as soon as they burn out. Set up your furniture so you have a clear path. Avoid moving your furniture around. If any of your floors are uneven, fix them. If there are any pets around you, be aware of where they are. Review your medicines with your doctor. Some medicines can make you feel dizzy. This can increase your chance of falling. Ask your doctor what other things that you can do to help prevent falls. This information is not intended to replace advice given to you by your health care provider. Make sure you discuss any questions you have with your health care provider. Document Released: 02/18/2009 Document Revised: 09/30/2015 Document Reviewed: 05/29/2014 Elsevier Interactive Patient Education  2017 [employer].

## 2021-09-27 NOTE — Progress Notes (Signed)
Office Visit Note   Patient: Carl Knapp           Date of Birth: 02/25/1946           MRN: [id number] Visit Date: 09/27/2021              Requested by: Colon Branch, [address] [place],  [place] [address] PCP: Colon Branch, MD   Assessment & Plan: Visit Diagnoses:  1. Low back pain, unspecified back pain laterality, unspecified chronicity, unspecified whether sciatica present     Plan: Impression is chronic right-sided low back pain with right lower extremity radiculopathy.  At this point, the patient has had good but temporary relief from prescription medication and physical therapy.  I believe it is appropriate to order an MRI of the lumbar spine to assess for structural abnormalities.  Follow-up with [place] once completed.  Call with concerns or questions.  Follow-Up Instructions: Return for after MRI.   Orders:  Orders Placed This Encounter  Procedures   XR Lumbar Spine 2-3 Views   No orders of the defined types were placed in this encounter.     Procedures: No procedures performed   Clinical Data: No additional findings.   Subjective: Chief Complaint  Patient presents with   Lower Back - Pain    HPI patient is a pleasant 76 year old gentleman who comes in today with recurrent right low back pain and right lower extremity sciatica.  He was seen in our office almost a year ago for this where his symptoms have been ongoing about 9 months prior.  The pain he has is to the right lower back and radiates down the back of the right leg.  Feels as though this is a stabbing sensation.  He has been taking Tylenol without relief.  He denies any paresthesias to the right lower extremity.  In the past, we started on a steroid taper and muscle relaxer as well as a course of physical therapy.  All of these significantly helped but were only temporary.  No previous MRI of the lumbar spine or epidural steroid injection.  He denies any bowel or bladder change or saddle  paresthesias.  Review of Systems as detailed in HPI.  All others reviewed and are negative.   Objective: Vital Signs: There were no vitals taken for this visit.  Physical Exam well-developed and well-nourished gentleman in no acute distress.  Alert and oriented x3.  Ortho Exam lumbar spine exam shows no spinous tenderness.  Does have paraspinous tenderness on the right.  Pain with lumbar extension.  No pain with lumbar flexion or rotation.  Negative straight leg raise.  No focal weakness.  He is neurovascular intact distally.  Specialty Comments:  No specialty comments available.  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   Chronic kidney disease (CKD) stage G3a/A1, moderately decreased glomerular filtration rate (GFR) between 45-59 mL/min/1.73 square meter and albuminuria creatinine ratio less than 30 mg/g (HCC) 09/14/2021   Adjustment disorder 09/14/2021   History of DVT (deep vein thrombosis) 09/14/2021  Atrial tachycardia, paroxysmal ([place]) 08/10/2021   PVC's (premature ventricular contractions) 08/10/2021   Type 2 diabetes mellitus ([place]) 09/29/2019   CAD (coronary artery disease) 01/20/2014   HLD (hyperlipidemia) 01/02/2014   Obstructive sleep apnea 05/07/2013   Obesity (BMI 30-39.9) 05/07/2013   Essential hypertension, benign 05/07/2013   Past Medical History:  Diagnosis Date   Allergy    Arthritis    Atrial tachycardia, paroxysmal (HCC)    Noted to have up to 14 beats in a row of atrial tachycardia on event monitor for/2023   Chronic  kidney disease    Coronary artery disease 12/2013   20-30% RCA by cath- states was good.   Diabetes mellitus without complication ([place])    type II   DVT (deep venous thrombosis) (HCC)    bilateral legs-greater on left. -3-5 years ago-tx. coumadin x 1 year.   Erectile dysfunction    GERD (gastroesophageal reflux disease)    Hx of cardiovascular stress test    ETT-Myoview (7/15):  ECG with ST depression; freq PVCs, + chest pain; normal perfusion   Hyperlipidemia    LDL goal<100   Hypertension    Macular degeneration    mild   OSA (obstructive sleep apnea)    Severe w AHI 37/hr now on CPAP at 10cm H2O   PVC's (premature ventricular contractions)    Isolated PVCs, ventricular couplets and triplets, bigeminal and trigeminal PVCs with PVC load less than 1% on event monitor 08/2021    Family History  Problem Relation Age of Onset   Cancer Mother    CAD Father    AAA (abdominal aortic aneurysm) Father    Heart attack Father    Arthritis Father    Cancer Father    Hypertension Father    Hypertension Other    Prostate cancer Neg Hx    Colon cancer Neg Hx     Past Surgical History:  Procedure Laterality Date   APPENDECTOMY     open '68   HERNIA REPAIR Bilateral 1980   inguinal   INSERTION OF MESH N/A 06/14/2017   Procedure: INSERTION OF MESH;  Surgeon: Alphonsa Overall, MD;  Location: [facility];  Service: General;  Laterality: N/A;   LEFT HEART CATHETERIZATION WITH CORONARY ANGIOGRAM N/A 12/09/2013   Procedure: LEFT HEART CATHETERIZATION WITH CORONARY ANGIOGRAM;  Surgeon: Jettie Booze, MD;  [facility] C[facility] LAB;  Service: Cardiovascular;  Laterality: N/A;   UMBILICAL HERNIA REPAIR N/A 06/14/2017   Procedure: LAPAROSCOPIC UMBILICAL HERNIA REPAIR WITH MESH ERAS PATHWAY;  Surgeon: Alphonsa Overall, MD;  Location: [facility];  Service: General;  Laterality: N/A;  ERAS PATHWAY   VASECTOMY     Social History   Occupational History   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

## 2021-10-05 ENCOUNTER — Encounter: Payer: Self-pay | Admitting: Internal Medicine

## 2021-10-05 ENCOUNTER — Telehealth: Payer: Self-pay

## 2021-10-05 NOTE — Telephone Encounter (Signed)
Records received from [facility]- placed in PCP yellow folder for review.

## 2021-10-08 ENCOUNTER — Ambulatory Visit (HOSPITAL_COMMUNITY)
Admission: RE | Admit: 2021-10-08 | Discharge: 2021-10-08 | Disposition: A | Discharge status: Home / Self Care | Payer: Medicare Other | Source: Ambulatory Visit | Attending: Orthopaedic Surgery | Admitting: Orthopaedic Surgery

## 2021-10-08 DIAGNOSIS — M545 Low back pain, unspecified: Secondary | ICD-10-CM | POA: Insufficient documentation

## 2021-10-08 DIAGNOSIS — M48061 Spinal stenosis, lumbar region without neurogenic claudication: Secondary | ICD-10-CM | POA: Diagnosis not present

## 2021-10-08 DIAGNOSIS — M5136 Other intervertebral disc degeneration, lumbar region: Secondary | ICD-10-CM | POA: Diagnosis not present

## 2021-10-08 IMAGING — MR MR LUMBAR SPINE W/O CM
4 of 5 series · 30 of 48 positions shown · non-contrast
Comparison: Lumbar spine radiograph [DATE]

CLINICAL DATA: Lower back pain

EXAM:
MRI LUMBAR SPINE WITHOUT CONTRAST
TECHNIQUE: Multiplanar, multisequence MR imaging of the lumbar spine was
performed. No intravenous contrast was administered.

[Series 5: T1 · sagittal · 4.0mm · 0.81mm/px · 6 of 17 slices shown (1 of 2)]
[im 1/17]
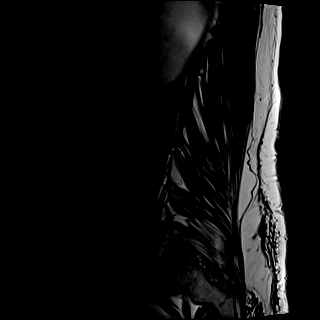
[im 4/17]
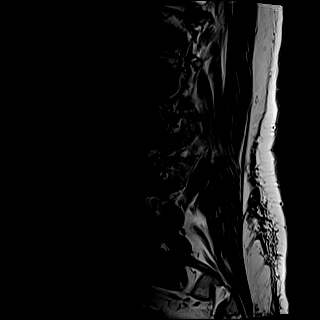
[im 7/17]
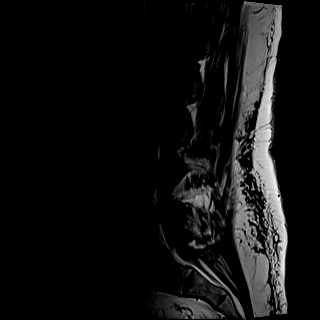
[im 10/17]
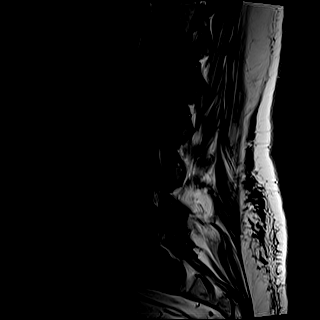
[im 13/17]
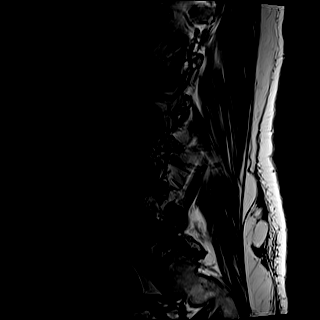
[im 17/17]
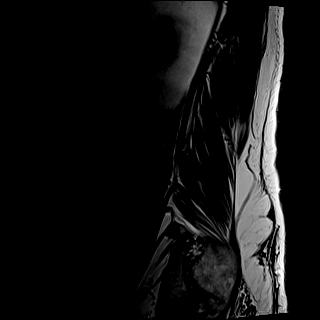

[Series 6: T2 · sagittal · 4.0mm · 0.81mm/px · 5 of 17 slices shown (1 of 2)]
[im 1/17]
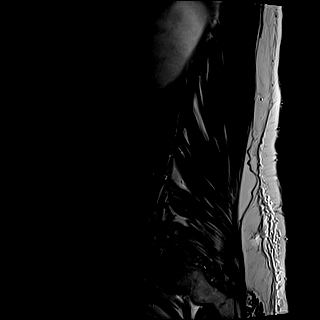
[im 5/17]
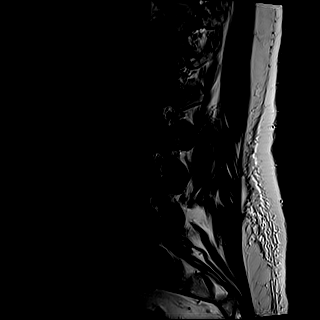
[im 9/17]
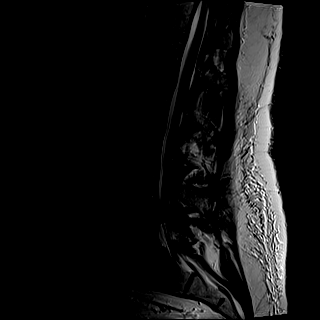
[im 13/17]
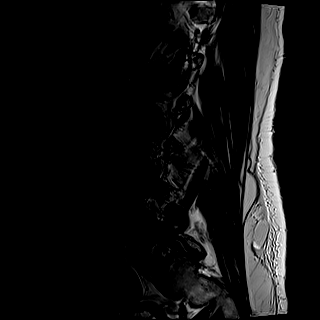
[im 17/17]
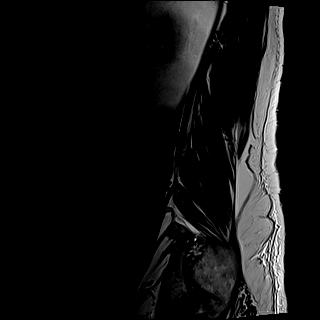

[Series 8: T2 · axial · 4.0mm · 0.62mm/px · z∈[-43,+187]mm · 10 of 50 slices shown (2 of 2)]
[im 4/50]
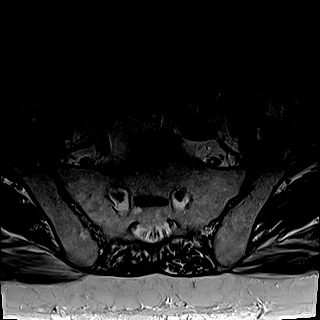
[im 7/50]
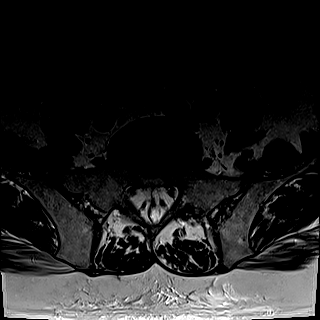
[im 10/50]
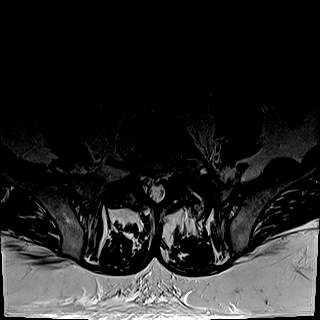
[im 17/50]
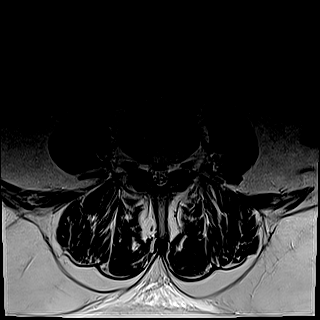
[im 23/50]
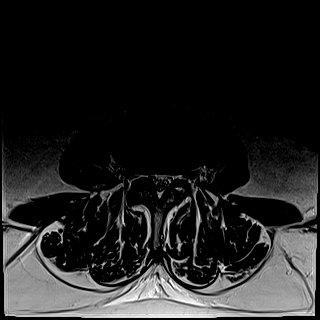
[im 27/50]
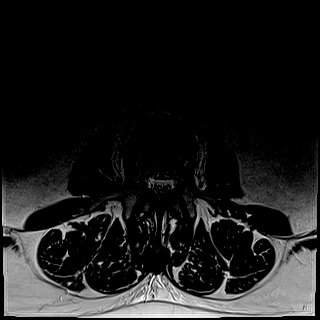
[im 30/50]
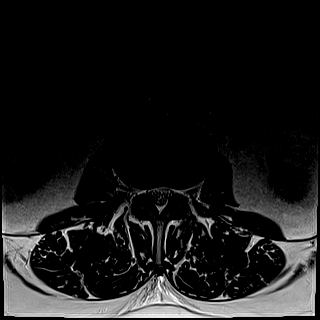
[im 36/50]
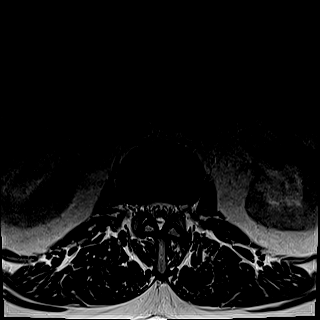
[im 43/50]
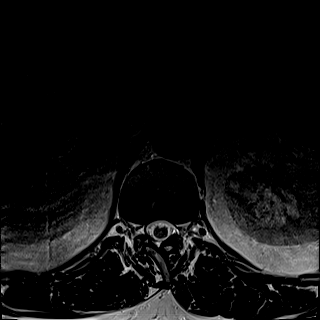
[im 50/50]
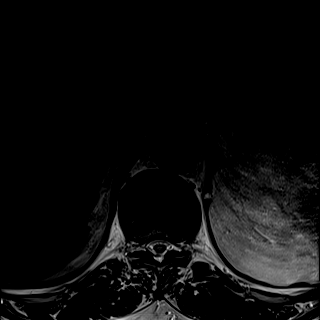

[Series 9: T1 · axial · 4.0mm · 0.39mm/px · z∈[-43,+152]mm · 9 of 50 slices shown (2 of 2)]
[im 4/50]
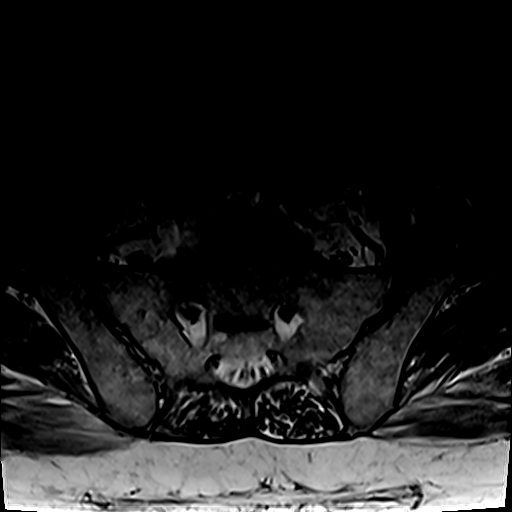
[im 7/50]
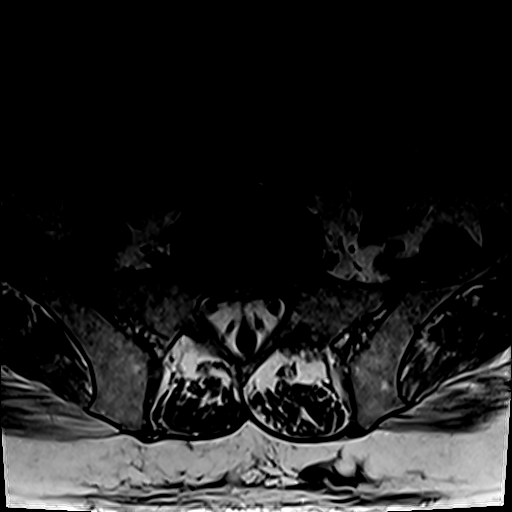
[im 10/50]
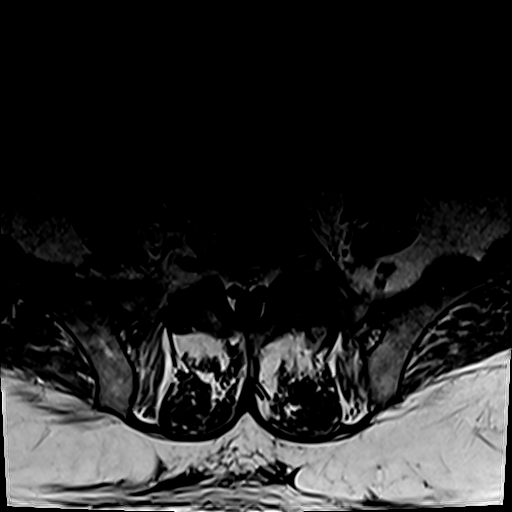
[im 17/50]
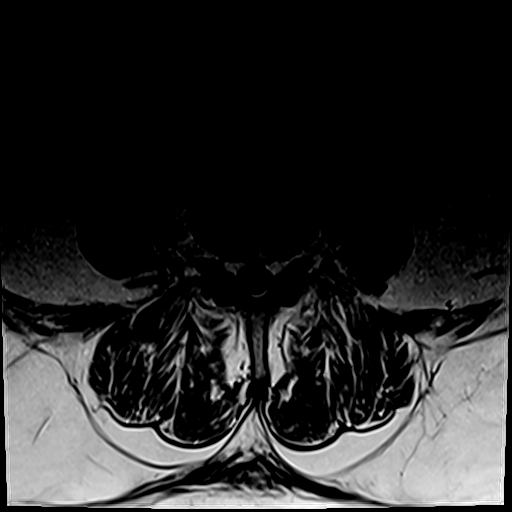
[im 23/50]
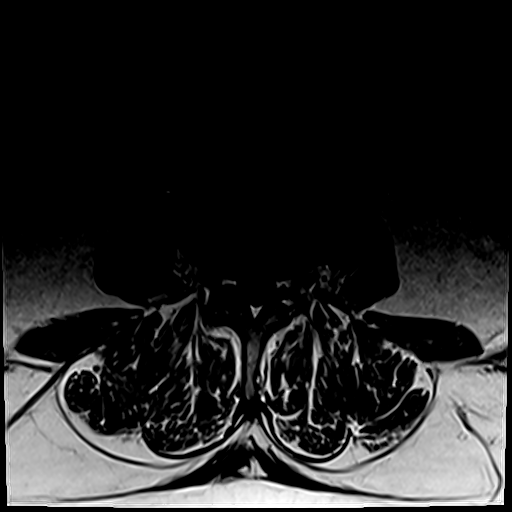
[im 27/50]
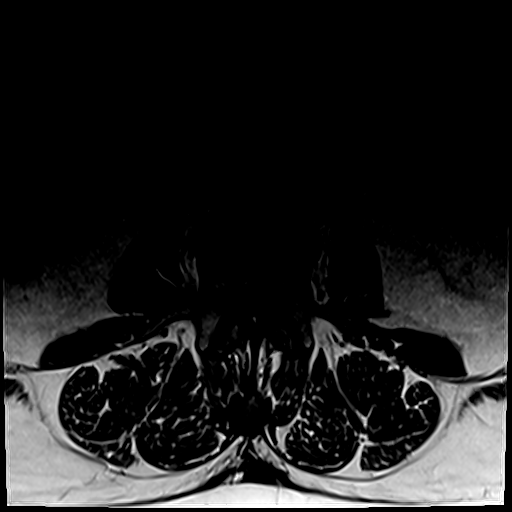
[im 30/50]
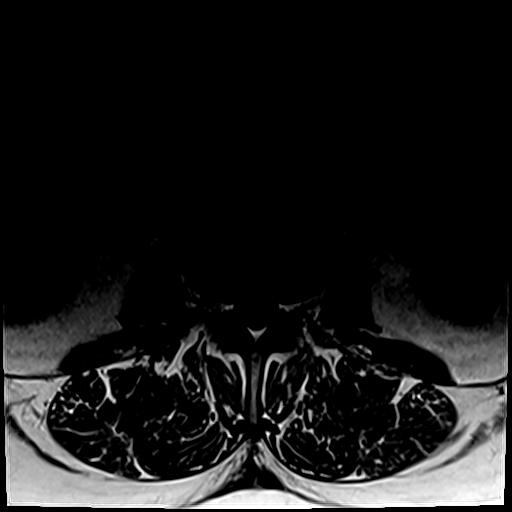
[im 36/50]
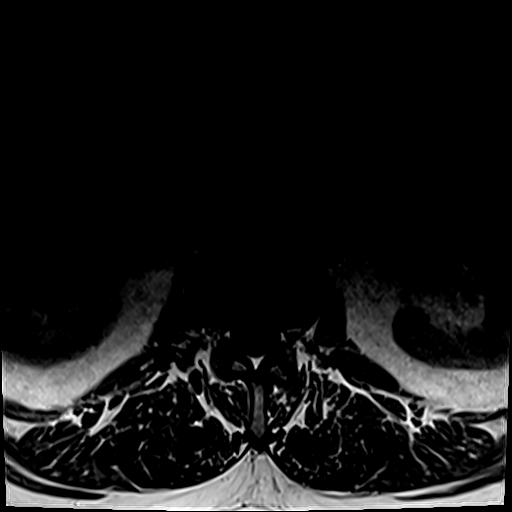
[im 43/50]
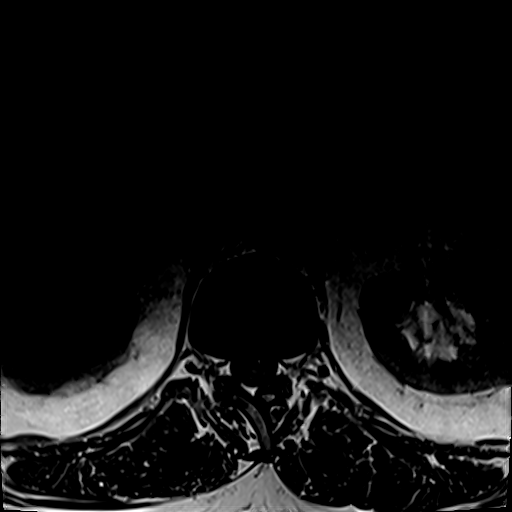

[30 of 48 positions shown; findings below may reference images not displayed]

FINDINGS: Segmentation:  Standard.

Alignment:  Physiologic.

Vertebrae: No fracture, evidence of discitis, or aggressive bone
lesion.

Conus medullaris and cauda equina: Conus extends to the L1-L2 level.
Conus and cauda equina appear normal.

Paraspinal and other soft tissues: Negative

Disc levels:

T12-L1: No significant spinal canal or neural foraminal narrowing.

L1-L2: No significant spinal canal or neural foraminal narrowing.

L2-L3: Mild disc bulging, ligament flavum hypertrophy mild facet
arthropathy results in mild spinal canal and bilateral subarticular
stenosis. No significant neural foraminal stenosis.

L3-L4: Mild disc bulging, ligament flavum hypertrophy mild facet
arthropathy. Mild spinal canal and right greater than left
subarticular stenosis, encroaching the descending right L4 nerve
root. No significant neural foraminal stenosis.

L4-L5: Broad-based disc bulging, ligament flavum hypertrophy and
mild facet arthropathy results in moderate spinal canal stenosis
with high-grade subarticular stenosis bilaterally potentially
impinging the descending L5 nerve roots. Mild bilateral neural
foraminal stenosis.

L5-S1: Minimal disc bulging. There is advanced bilateral facet
arthropathy. No spinal canal stenosis. There is mild left-sided
neural foraminal stenosis. No right neural foraminal stenosis.
IMPRESSION: Multilevel degenerative changes of the lumbar spine, worst at L4-L5,
where there is spinal canal stenosis with high-grade subarticular
stenosis bilaterally, potentially impinging the descending L5 nerve
roots. Mild bilateral neural foraminal stenosis at this level.

Mild spinal canal and bilateral subarticular stenosis at L2-L3.

Mild spinal canal and right greater than left subarticular stenosis
at L3-L4, encroaching the descending right L4 nerve root.

Mild left-sided neural foraminal stenosis at L5-S1.

## 2021-10-10 ENCOUNTER — Other Ambulatory Visit: Payer: Medicare Other | Admitting: *Deleted

## 2021-10-10 DIAGNOSIS — E7800 Pure hypercholesterolemia, unspecified: Secondary | ICD-10-CM

## 2021-10-10 DIAGNOSIS — R079 Chest pain, unspecified: Secondary | ICD-10-CM

## 2021-10-10 DIAGNOSIS — I10 Essential (primary) hypertension: Secondary | ICD-10-CM | POA: Diagnosis not present

## 2021-10-10 DIAGNOSIS — R001 Bradycardia, unspecified: Secondary | ICD-10-CM

## 2021-10-10 LAB — LIPID PANEL
Chol/HDL Ratio: 3.4 ratio (ref 0.0–5.0)
Cholesterol, Total: 138 mg/dL (ref 100–199)
HDL: 41 mg/dL (ref >39)
LDL Chol Calc (NIH): 80 mg/dL (ref 0–99)
Triglycerides: 92 mg/dL (ref 0–149)
VLDL Cholesterol Cal: 17 mg/dL (ref 5–40)

## 2021-10-10 LAB — ALT: ALT: 31 IU/L (ref 0–44)

## 2021-10-10 NOTE — Progress Notes (Signed)
Needs appt please.  Thanks.

## 2021-10-10 NOTE — Progress Notes (Signed)
Tried to call patient. LMOM to call and schedule a follow up appointment.

## 2021-10-11 ENCOUNTER — Emergency Department (HOSPITAL_COMMUNITY)
Admission: EM | Admit: 2021-10-11 | Discharge: 2021-10-12 | Disposition: A | Discharge status: Home / Self Care | Payer: Medicare Other | Attending: Emergency Medicine | Admitting: Emergency Medicine

## 2021-10-11 ENCOUNTER — Emergency Department (HOSPITAL_COMMUNITY): Payer: Medicare Other

## 2021-10-11 ENCOUNTER — Telehealth: Payer: Self-pay

## 2021-10-11 ENCOUNTER — Encounter (HOSPITAL_COMMUNITY): Payer: Self-pay

## 2021-10-11 DIAGNOSIS — Z7982 Long term (current) use of aspirin: Secondary | ICD-10-CM | POA: Diagnosis not present

## 2021-10-11 DIAGNOSIS — G459 Transient cerebral ischemic attack, unspecified: Secondary | ICD-10-CM | POA: Diagnosis not present

## 2021-10-11 DIAGNOSIS — R2681 Unsteadiness on feet: Secondary | ICD-10-CM | POA: Insufficient documentation

## 2021-10-11 DIAGNOSIS — Z20822 Contact with and (suspected) exposure to covid-19: Secondary | ICD-10-CM | POA: Diagnosis not present

## 2021-10-11 DIAGNOSIS — J189 Pneumonia, unspecified organism: Secondary | ICD-10-CM

## 2021-10-11 DIAGNOSIS — I2510 Atherosclerotic heart disease of native coronary artery without angina pectoris: Secondary | ICD-10-CM | POA: Diagnosis not present

## 2021-10-11 DIAGNOSIS — R42 Dizziness and giddiness: Secondary | ICD-10-CM | POA: Diagnosis not present

## 2021-10-11 DIAGNOSIS — G4489 Other headache syndrome: Secondary | ICD-10-CM | POA: Diagnosis not present

## 2021-10-11 DIAGNOSIS — Y900 Blood alcohol level of less than 20 mg/100 ml: Secondary | ICD-10-CM | POA: Insufficient documentation

## 2021-10-11 DIAGNOSIS — H538 Other visual disturbances: Secondary | ICD-10-CM | POA: Diagnosis not present

## 2021-10-11 DIAGNOSIS — R059 Cough, unspecified: Secondary | ICD-10-CM | POA: Diagnosis not present

## 2021-10-11 DIAGNOSIS — Z79899 Other long term (current) drug therapy: Secondary | ICD-10-CM | POA: Diagnosis not present

## 2021-10-11 DIAGNOSIS — I10 Essential (primary) hypertension: Secondary | ICD-10-CM | POA: Diagnosis not present

## 2021-10-11 DIAGNOSIS — D72829 Elevated white blood cell count, unspecified: Secondary | ICD-10-CM | POA: Diagnosis not present

## 2021-10-11 DIAGNOSIS — R4182 Altered mental status, unspecified: Secondary | ICD-10-CM | POA: Diagnosis present

## 2021-10-11 DIAGNOSIS — R0902 Hypoxemia: Secondary | ICD-10-CM | POA: Diagnosis not present

## 2021-10-11 DIAGNOSIS — R410 Disorientation, unspecified: Secondary | ICD-10-CM | POA: Diagnosis not present

## 2021-10-11 DIAGNOSIS — R7989 Other specified abnormal findings of blood chemistry: Secondary | ICD-10-CM | POA: Insufficient documentation

## 2021-10-11 DIAGNOSIS — E1165 Type 2 diabetes mellitus with hyperglycemia: Secondary | ICD-10-CM | POA: Insufficient documentation

## 2021-10-11 DIAGNOSIS — Z7984 Long term (current) use of oral hypoglycemic drugs: Secondary | ICD-10-CM | POA: Insufficient documentation

## 2021-10-11 DIAGNOSIS — I639 Cerebral infarction, unspecified: Secondary | ICD-10-CM | POA: Diagnosis not present

## 2021-10-11 DIAGNOSIS — E7800 Pure hypercholesterolemia, unspecified: Secondary | ICD-10-CM

## 2021-10-11 LAB — CBC
HCT: 40.6 % (ref 39.0–52.0)
Hemoglobin: 13.6 g/dL (ref 13.0–17.0)
MCH: 31.6 pg (ref 26.0–34.0)
MCHC: 33.5 g/dL (ref 30.0–36.0)
MCV: 94.2 fL (ref 80.0–100.0)
Platelets: 238 10*3/uL (ref 150–400)
RBC: 4.31 MIL/uL (ref 4.22–5.81)
RDW: 12.4 % (ref 11.5–15.5)
WBC: 16.8 10*3/uL — ABNORMAL HIGH (ref 4.0–10.5)
nRBC: 0 % (ref 0.0–0.2)

## 2021-10-11 LAB — COMPREHENSIVE METABOLIC PANEL
ALT: 27 U/L (ref 0–44)
AST: 25 U/L (ref 15–41)
Albumin: 3.7 g/dL (ref 3.5–5.0)
Alkaline Phosphatase: 56 U/L (ref 38–126)
Anion gap: 12 (ref 5–15)
BUN: 23 mg/dL (ref 8–23)
CO2: 22 mmol/L (ref 22–32)
Calcium: 8.9 mg/dL (ref 8.9–10.3)
Chloride: 105 mmol/L (ref 98–111)
Creatinine, Ser: 1.36 mg/dL — ABNORMAL HIGH (ref 0.61–1.24)
GFR, Estimated: 54 mL/min — ABNORMAL LOW (ref >60)
Glucose, Bld: 179 mg/dL — ABNORMAL HIGH (ref 70–99)
Potassium: 3.5 mmol/L (ref 3.5–5.1)
Sodium: 139 mmol/L (ref 135–145)
Total Bilirubin: 1 mg/dL (ref 0.3–1.2)
Total Protein: 6.8 g/dL (ref 6.5–8.1)

## 2021-10-11 LAB — DIFFERENTIAL
Abs Immature Granulocytes: 0.08 10*3/uL — ABNORMAL HIGH (ref 0.00–0.07)
Basophils Absolute: 0 10*3/uL (ref 0.0–0.1)
Basophils Relative: 0 %
Eosinophils Absolute: 0.1 10*3/uL (ref 0.0–0.5)
Eosinophils Relative: 0 %
Immature Granulocytes: 1 %
Lymphocytes Relative: 4 %
Lymphs Abs: 0.7 10*3/uL (ref 0.7–4.0)
Monocytes Absolute: 1.2 10*3/uL — ABNORMAL HIGH (ref 0.1–1.0)
Monocytes Relative: 7 %
Neutro Abs: 14.8 10*3/uL — ABNORMAL HIGH (ref 1.7–7.7)
Neutrophils Relative %: 88 %

## 2021-10-11 LAB — APTT: aPTT: 29 seconds (ref 24–36)

## 2021-10-11 LAB — ETHANOL: Alcohol, Ethyl (B): <10 mg/dL (ref <10)

## 2021-10-11 LAB — PROTIME-INR
INR: 1.1 (ref 0.8–1.2)
Prothrombin Time: 14 seconds (ref 11.4–15.2)

## 2021-10-11 IMAGING — CT CT ANGIO HEAD-NECK (W OR W/O PERF)
1 of 11 series · 5 of 33 positions shown · IV contrast (OMNI 350)
Comparison: None Available.

CLINICAL DATA: Dizziness

EXAM:
CT ANGIOGRAPHY HEAD AND NECK
TECHNIQUE: Multidetector CT imaging of the head and neck was performed using
the standard protocol during bolus administration of intravenous
contrast. Multiplanar CT image reconstructions and MIPs were
obtained to evaluate the vascular anatomy. Carotid stenosis
measurements (when applicable) are obtained utilizing NASCET
criteria, using the distal internal carotid diameter as the
denominator.

[Series 12: cta neck axial · axial · 0.39mm/px · z∈[-266,-18]mm · 5 of 371 slices shown]
[im 62/371  soft-tissue]
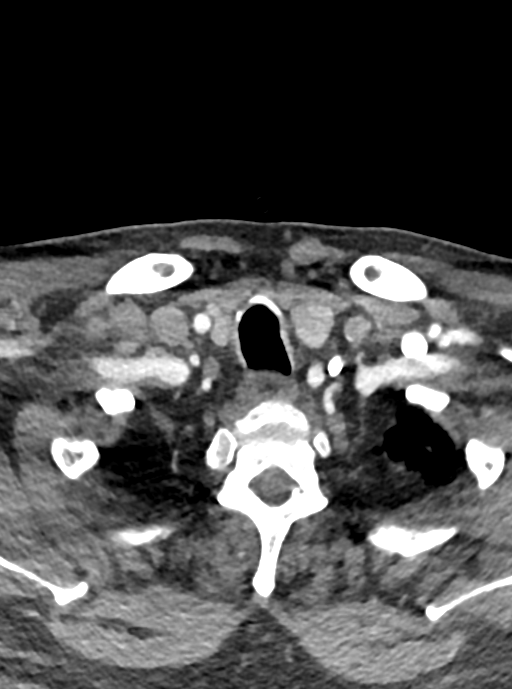
[im 124/371  bone]
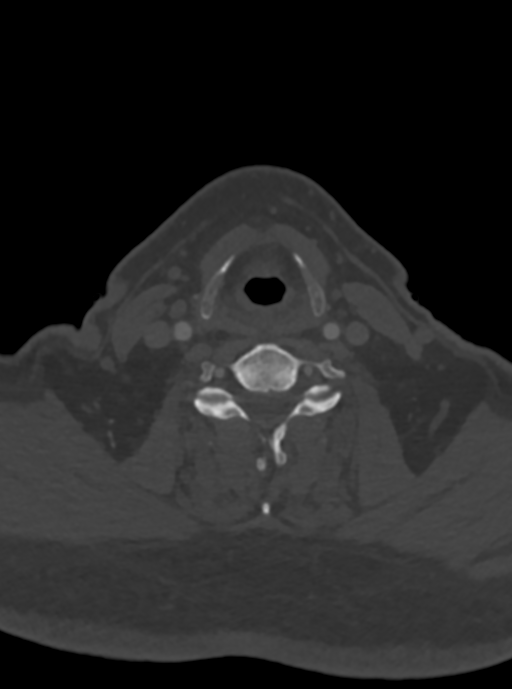
[im 186/371  soft-tissue]
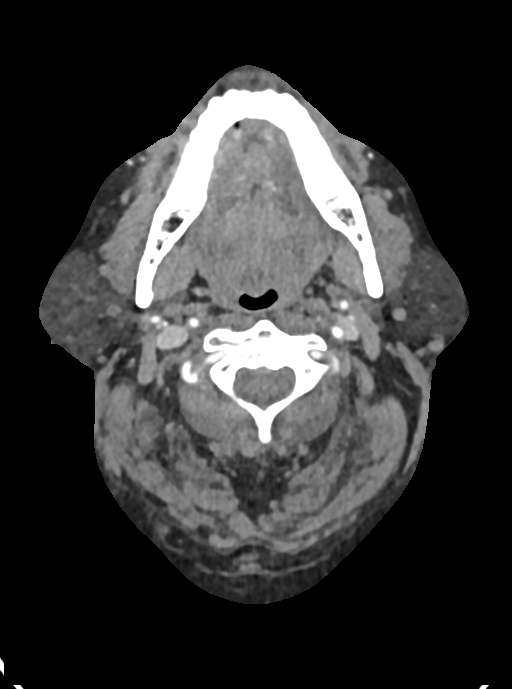
[im 247/371  bone]
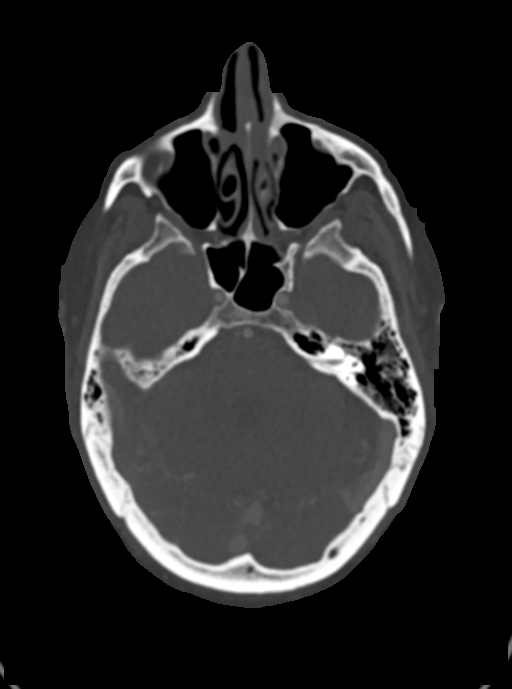
[im 309/371  soft-tissue]
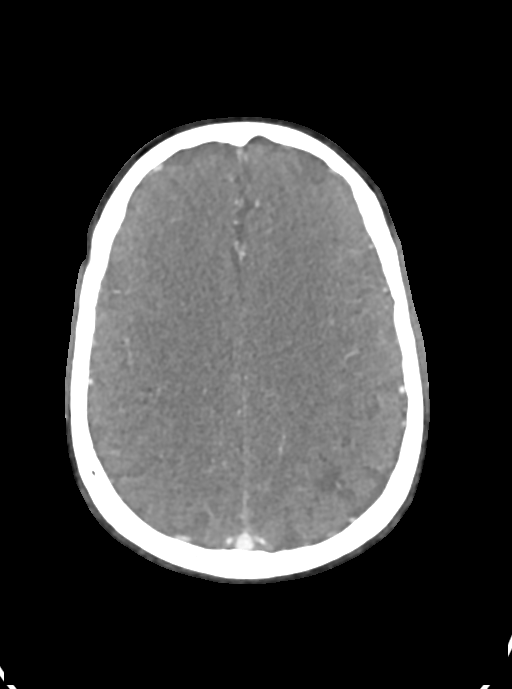

[5 of 33 positions shown; findings below may reference images not displayed]

RADIATION DOSE REDUCTION: This exam was performed according to the
departmental dose-optimization program which includes automated
exposure control, adjustment of the mA and/or kV according to
patient size and/or use of iterative reconstruction technique.

CONTRAST:  65mL OMNIPAQUE IOHEXOL 350 MG/ML SOLN
FINDINGS: CT HEAD FINDINGS

Brain: There is no mass, hemorrhage or extra-axial collection. The
size and configuration of the ventricles and extra-axial CSF spaces
are normal. There is no acute or chronic infarction. The brain
parenchyma is normal.

Skull: The visualized skull base, calvarium and extracranial soft
tissues are normal.

Sinuses/Orbits: No fluid levels or advanced mucosal thickening of
the visualized paranasal sinuses. No mastoid or middle ear effusion.
The orbits are normal.

CTA NECK FINDINGS

SKELETON: There is no bony spinal canal stenosis. No lytic or
blastic lesion.

OTHER NECK: Normal pharynx, larynx and major salivary glands. No
cervical lymphadenopathy. Unremarkable thyroid gland.

UPPER CHEST: Biapical emphysema.

AORTIC ARCH:

There is no calcific atherosclerosis of the aortic arch. There is no
aneurysm, dissection or hemodynamically significant stenosis of the
visualized portion of the aorta. Conventional 3 vessel aortic
branching pattern. The visualized proximal subclavian arteries are
widely patent.

RIGHT CAROTID SYSTEM: Normal without aneurysm, dissection or
stenosis.

LEFT CAROTID SYSTEM: Normal without aneurysm, dissection or
stenosis.

VERTEBRAL ARTERIES: Left dominant configuration. Both origins are
clearly patent. There is no dissection, occlusion or flow-limiting
stenosis to the skull base (V1-V3 segments).

CTA HEAD FINDINGS

POSTERIOR CIRCULATION:

--Vertebral arteries: Normal V4 segments.

--Inferior cerebellar arteries: Normal.

--Basilar artery: Normal.

--Superior cerebellar arteries: Normal.

--Posterior cerebral arteries (PCA): Normal.

ANTERIOR CIRCULATION:

--Intracranial internal carotid arteries: Normal.

--Anterior cerebral arteries (ACA): Normal. Both A1 segments are
present. Patent anterior communicating artery (a-comm).

--Middle cerebral arteries (MCA): Normal.

VENOUS SINUSES: As permitted by contrast timing, patent.

ANATOMIC VARIANTS: None

Review of the MIP images confirms the above findings.
IMPRESSION: 1. No emergent large vessel occlusion or high-grade stenosis of the
intracranial or cervical arteries.
2. Emphysema ([II]-[II]).

## 2021-10-11 NOTE — ED Provider Notes (Signed)
11:56 PM Assumed care from Dr. Armandina Gemma, please see their note for full history, physical and decision making until this point. In brief this is a 76 y.o. year old male who presented to the ED tonight with Altered Mental Status     Truncal ataxia, acute onset of 1600, with nausea. Worsened around 1900. Outside window for tpa. Plan for CTA and further workup as needed for posterior stroke.   Reevaluation patient without any focal neurologic deficits.  He is able to ambulate without difficulty.  He is able to stand with his eyes closed for 10 seconds without difficulty.  He is able to hold his arms up against gravity without difficulty.  He is able to speak normally per him and his wife's account.  No obvious facial droop.  EOM intact.  He is borderline hypoxic which does not make sense without history of COPD or other complaints he does have some right lower lobe crackles so we will add on a chest x-ray and talk to neurology about any other further work-up from neurology standpoint.   Neurology recommended MRI. This was unremarkable. Patient able to ambulate without deficit. The vitals show multiple episodes of low oxygen saturations. I believe these are related to laying down. On my exam when he is awake and speaking to me, he is consistently above 90. Not dyspneic. No DOE either. I wonder if he has some underlying OSA. I don't think he needs admitted for this and can follow up with PCP for same along with likely TIA workup.   Discharge instructions, including strict return precautions for new or worsening symptoms, given. Patient and/or family verbalized understanding and agreement with the plan as described.   Labs, studies and imaging reviewed by myself and considered in medical decision making if ordered. Imaging interpreted by radiology.  Labs Reviewed  CBC - Abnormal; Notable for the following components:      Result Value   WBC 16.8 (*)    All other components within normal limits   DIFFERENTIAL - Abnormal; Notable for the following components:   Neutro Abs 14.8 (*)    Monocytes Absolute 1.2 (*)    Abs Immature Granulocytes 0.08 (*)    All other components within normal limits  COMPREHENSIVE METABOLIC PANEL - Abnormal; Notable for the following components:   Glucose, Bld 179 (*)    Creatinine, Ser 1.36 (*)    GFR, Estimated 54 (*)    All other components within normal limits  RESP PANEL BY RT-PCR (FLU A&B, COVID) ARPGX2  ETHANOL  PROTIME-INR  APTT  RAPID URINE DRUG SCREEN, HOSP PERFORMED  URINALYSIS, ROUTINE W REFLEX MICROSCOPIC  I-STAT CHEM 8, ED    CT ANGIO HEAD NECK W WO CM    (Results Pending)    No follow-ups on file.    Nayef [employer], Corene Cornea, MD 10/12/21 [phone]

## 2021-10-11 NOTE — Telephone Encounter (Signed)
Records reviewed, labs were abstracted

## 2021-10-11 NOTE — Telephone Encounter (Signed)
-----   Message from Sueanne Margarita, MD sent at 10/10/2021  5:47 PM EDT ----- LDL still not at goal of less than 70.  Please find out if patient has missed any doses of atorvastatin.  If he has not then increase to 80 mg daily and repeat FLP and ALT in 6 weeks

## 2021-10-11 NOTE — Telephone Encounter (Signed)
Spoke with patient and discussed lab results.  Patient states his PCP Dr. Larose Kells had decreased his Atorvastatin to '20mg'$  QD about 2 weeks ago due to patient experiencing leg pain.   Patient states he has not noticed any improvement in leg pain with decreased dose of Atorvastatin. Patient has an appt with Dr. Larose Kells on 10/17/21.  Will forward to Dr. Radford Pax to review and advise.

## 2021-10-11 NOTE — ED Provider Notes (Signed)
[facility] EMERGENCY DEPARTMENT Provider Note   CSN: [id number] Arrival date & time: 10/11/21  2131     History  Chief Complaint  Patient presents with   Altered Mental Status    Carl Knapp is a 76 y.o. male.   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  The patient states that he was last normal around 4 PM when he began to feel somewhat disoriented and confused.  He states that he had sudden onset unsteadiness on his feet that worsened around 7 PM with associated nausea.  He denies a history of vertigo.  He states that the confusion lasted for around 30 to 45 minutes but still feels that he is somewhat unsteady on his feet and still feels slightly "out of it."  He denies any fevers or chills.  He denies any facial droop or aphasia.  He denies any dysphagia.  He denies any numbness or weakness in his extremities.  Home Medications Prior to Admission medications   Medication Sig Start Date End Date Taking? Authorizing Provider  acetaminophen (TYLENOL) 500 MG tablet Take 1,000 mg by mouth daily as needed for moderate pain or headache.    [provider]  aspirin EC 81 MG tablet Take 81 mg by mouth daily.    [provider]  atorvastatin (LIPITOR) 40 MG tablet Take 1 tablet (40 mg total) by mouth daily. 08/19/21   Sueanne Margarita, MD  ketoconazole (NIZORAL) 2 % cream Apply 1 application. topically 2 (two) times daily as needed for irritation (eczema).    Colon Branch, MD  loratadine (CLARITIN) 10 MG tablet Take 10 mg by mouth daily as needed for allergies.    [provider]  losartan (COZAAR) 100 MG tablet Take 1 tablet (100 mg total) by mouth daily. 08/19/21   Sueanne Margarita, MD  metFORMIN (GLUCOPHAGE) 1000 MG tablet Take 1,000 mg by mouth 2 (two) times daily with a meal.  05/04/15    [provider]  Multiple Vitamins-Minerals (PRESERVISION AREDS 2) CAPS Take 1 capsule by mouth in the morning and at bedtime.    [provider]  multivitamin (ONE-A-DAY MEN'S) TABS tablet Take 1 tablet by mouth daily.    [provider]  Omega-3 Fatty Acids (FISH OIL) 1000 MG CAPS Take 1 capsule by mouth in the morning and at bedtime.    [provider]  pantoprazole (PROTONIX) 40 MG tablet Take 1 tablet (40 mg total) by mouth daily before breakfast. 09/21/21   Colon Branch, MD  pioglitazone (ACTOS) 30 MG tablet Take 1 tablet (30 mg total) by mouth daily. 09/21/21   Colon Branch, MD  sildenafil (REVATIO) 20 MG tablet Take 40-60 mg by mouth daily as needed (erectile dysfunction).    [provider]  triamcinolone cream (KENALOG) 0.1 % Apply 1 application. topically 2 (two) times daily as needed (eczema).    Colon Branch, MD      Allergies    Ozempic (0.25 or 0.5 mg-dose) [semaglutide(0.25 or 0.'5mg'$ -dos)] and Simvastatin    Review of Systems   Review of Systems  All other systems reviewed and are negative.  Physical Exam Updated Vital Signs BP (!) 173/82   Pulse 86   Temp 99.8 F (37.7 C) (Oral)   Resp 14   SpO2 93%  Physical Exam Vitals  and nursing note reviewed.  Constitutional:      General: He is not in acute distress.    Appearance: He is well-developed.  HENT:     Head: Normocephalic and atraumatic.  Eyes:     Conjunctiva/sclera: Conjunctivae normal.  Cardiovascular:     Rate and Rhythm: Normal rate and regular rhythm.  Pulmonary:     Effort: Pulmonary effort is normal. No respiratory distress.     Breath sounds: Normal breath sounds.  Abdominal:     Palpations: Abdomen is soft.     Tenderness: There is no abdominal tenderness.  Musculoskeletal:        General: No swelling.     Cervical back: Neck supple.  Skin:    General: Skin is warm and dry.     Capillary Refill: Capillary refill takes less than 2 seconds.  Neurological:      Mental Status: He is alert.     Comments: MENTAL STATUS EXAM:    Orientation: Alert and oriented to person, place and time.  Memory: Cooperative, follows commands well.  Language: Speech is clear and language is normal.   CRANIAL NERVES:    CN 2 (Optic): Visual fields intact to confrontation.  CN 3,4,6 (EOM): Pupils equal and reactive to light. Full extraocular eye movement without nystagmus.  CN 5 (Trigeminal): Facial sensation is normal, no weakness of masticatory muscles.  CN 7 (Facial): No facial weakness or asymmetry.  CN 8 (Auditory): Auditory acuity grossly normal.  CN 9,10 (Glossophar): The uvula is midline, the palate elevates symmetrically.  CN 11 (spinal access): Normal sternocleidomastoid and trapezius strength.  CN 12 (Hypoglossal): The tongue is midline. No atrophy or fasciculations.Marland Kitchen   MOTOR:  Muscle Strength: 5/5RUE, 5/5LUE, 5/5RLE, 5/5LLE  REFLEXES: No clonus.   COORDINATION:   Intact finger-to-nose, no tremor, no pronator drift.   SENSATION:   Intact to light touch all four extremities.  GAIT: Truncal ataxia noted to the left   Psychiatric:        Mood and Affect: Mood normal.    ED Results / Procedures / Treatments   Labs (all labs ordered are listed, but only abnormal results are displayed) Labs Reviewed  CBC - Abnormal; Notable for the following components:      Result Value   WBC 16.8 (*)    All other components within normal limits  DIFFERENTIAL - Abnormal; Notable for the following components:   Neutro Abs 14.8 (*)    Monocytes Absolute 1.2 (*)    Abs Immature Granulocytes 0.08 (*)    All other components within normal limits  COMPREHENSIVE METABOLIC PANEL - Abnormal; Notable for the following components:   Glucose, Bld 179 (*)    Creatinine, Ser 1.36 (*)    GFR, Estimated 54 (*)    All other components within normal limits  RESP PANEL BY RT-PCR (FLU A&B, COVID) ARPGX2  ETHANOL  PROTIME-INR  APTT  RAPID URINE DRUG SCREEN, HOSP PERFORMED   URINALYSIS, ROUTINE W REFLEX MICROSCOPIC  I-STAT CHEM 8, ED    EKG EKG Interpretation  Date/Time:  Tuesday October 11 2021 22:19:45 EDT Ventricular Rate:  82 PR Interval:  147 QRS Duration: 100 QT Interval:  394 QTC Calculation: 461 R Axis:   -42 Text Interpretation: Sinus rhythm Left axis deviation Abnormal R-wave progression, late transition Confirmed by Regan Lemming ([id number]) on 10/11/2021 10:46:30 PM  Radiology No results found.  Procedures Procedures    Medications Ordered in ED Medications - No data to display  ED Course/ Medical  Decision Making/ A&P                           Medical Decision Making Amount and/or Complexity of Data Reviewed Labs: ordered. Radiology: ordered.   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  The patient states that he was last normal around 4 PM when he began to feel somewhat disoriented and confused.  He states that he had sudden onset unsteadiness on his feet that worsened around 7 PM with associated nausea.  He denies a history of vertigo.  He states that the confusion lasted for around 30 to 45 minutes but still feels that he is somewhat unsteady on his feet and still feels slightly "out of it."  He denies any fevers or chills.  He denies any facial droop or aphasia.  He denies any dysphagia.  He denies any numbness or weakness in his extremities.  On arrival, the patient was vitally stable.  Sinus rhythm noted on cardiac telemetry.  Physical exam significant for truncal ataxia with no other significant neurologic deficits noted.  Patient presenting with vertiginous symptoms with associated nausea and change in mental status/confusion.  Concern for posterior CVA.  The patient is outside the window for tPA therefore code stroke not called.  CTA imaging of the head and neck with contrast was ordered.  Stroke  laboratory work-up ordered.  Ethanol level was normal, CBC with a leukocytosis to 16.8, CBG 179 on CMP with an elevated creatinine of 1.36 which appears to be at baseline. UA pending. CTA of the head and neck pending. Plan for follow-up of CTA imaging, likely MRI imaging and neuro consultation. Signout given to Dr. Dayna Barker at [phone].    Final Clinical Impression(s) / ED Diagnoses Final diagnoses:  Confusion  Dizziness    Rx / DC Orders ED Discharge Orders     None         Regan Lemming, MD 10/11/21 2348

## 2021-10-11 NOTE — ED Triage Notes (Signed)
Pt comes via [facility] for home for, LSN at 4pm, woke up at 7pm with sudden onset of confusion, dizziness\, headache, and nausea and unsteady gait. Confusion lasted 30-45 minutes but has not resolved

## 2021-10-12 ENCOUNTER — Telehealth: Payer: Self-pay

## 2021-10-12 ENCOUNTER — Emergency Department (HOSPITAL_COMMUNITY): Payer: Medicare Other

## 2021-10-12 DIAGNOSIS — I639 Cerebral infarction, unspecified: Secondary | ICD-10-CM | POA: Diagnosis not present

## 2021-10-12 DIAGNOSIS — R42 Dizziness and giddiness: Secondary | ICD-10-CM | POA: Diagnosis not present

## 2021-10-12 DIAGNOSIS — R059 Cough, unspecified: Secondary | ICD-10-CM | POA: Diagnosis not present

## 2021-10-12 DIAGNOSIS — R410 Disorientation, unspecified: Secondary | ICD-10-CM | POA: Diagnosis not present

## 2021-10-12 DIAGNOSIS — R0902 Hypoxemia: Secondary | ICD-10-CM | POA: Diagnosis not present

## 2021-10-12 LAB — URINALYSIS, ROUTINE W REFLEX MICROSCOPIC
Bacteria, UA: NONE SEEN
Bilirubin Urine: NEGATIVE
Glucose, UA: 50 mg/dL — AB
Hgb urine dipstick: NEGATIVE
Ketones, ur: 5 mg/dL — AB
Leukocytes,Ua: NEGATIVE
Nitrite: NEGATIVE
Protein, ur: 100 mg/dL — AB
Specific Gravity, Urine: 1.017 (ref 1.005–1.030)
pH: 5 (ref 5.0–8.0)

## 2021-10-12 LAB — I-STAT CHEM 8, ED
BUN: 22 mg/dL (ref 8–23)
Calcium, Ion: 1.08 mmol/L — ABNORMAL LOW (ref 1.15–1.40)
Chloride: 102 mmol/L (ref 98–111)
Creatinine, Ser: 1.2 mg/dL (ref 0.61–1.24)
Glucose, Bld: 189 mg/dL — ABNORMAL HIGH (ref 70–99)
HCT: 40 % (ref 39.0–52.0)
Hemoglobin: 13.6 g/dL (ref 13.0–17.0)
Potassium: 3.5 mmol/L (ref 3.5–5.1)
Sodium: 139 mmol/L (ref 135–145)
TCO2: 22 mmol/L (ref 22–32)

## 2021-10-12 LAB — RAPID URINE DRUG SCREEN, HOSP PERFORMED
Amphetamines: NOT DETECTED
Barbiturates: NOT DETECTED
Benzodiazepines: NOT DETECTED
Cocaine: NOT DETECTED
Opiates: NOT DETECTED
Tetrahydrocannabinol: NOT DETECTED

## 2021-10-12 LAB — TROPONIN I (HIGH SENSITIVITY): Troponin I (High Sensitivity): 17 ng/L (ref <18)

## 2021-10-12 LAB — RESP PANEL BY RT-PCR (FLU A&B, COVID) ARPGX2
Influenza A by PCR: NEGATIVE
Influenza B by PCR: NEGATIVE
SARS Coronavirus 2 by RT PCR: NEGATIVE

## 2021-10-12 LAB — BRAIN NATRIURETIC PEPTIDE: B Natriuretic Peptide: 62.3 pg/mL (ref 0.0–100.0)

## 2021-10-12 MED ORDER — DOXYCYCLINE HYCLATE 100 MG PO CAPS: 100.0000 mg | ORAL_CAPSULE | Freq: Two times a day (BID) | ORAL | 0 refills | Status: DC

## 2021-10-12 MED ORDER — IOHEXOL 350 MG/ML SOLN
65.0000 mL | Freq: Once | INTRAVENOUS | Status: AC | PRN
  Administered 2021-10-12: 65 mL via INTRAVENOUS

## 2021-10-12 MED ORDER — DOXYCYCLINE HYCLATE 100 MG PO TABS
100.0000 mg | ORAL_TABLET | Freq: Once | ORAL | Status: AC
  Administered 2021-10-12: 100 mg via ORAL
  Filled 2021-10-12: qty 1

## 2021-10-12 MED ORDER — LORAZEPAM 2 MG/ML IJ SOLN: 1.0000 mg | Freq: Once | INTRAMUSCULAR | Status: DC | PRN

## 2021-10-12 NOTE — Addendum Note (Signed)
Addended by: Molli Barrows on: 10/12/2021 08:45 AM   Modules accepted: Orders

## 2021-10-12 NOTE — Telephone Encounter (Signed)
Left message for patient informing him that Dr. Radford Pax recommends he see our Pharmacist in the [facility] to explore other options for cholesterol-lowering medications.  Referral placed. Provided office number for callback if any questions or concerns.

## 2021-10-12 NOTE — Telephone Encounter (Signed)
Pt seen in ED 

## 2021-10-12 NOTE — Telephone Encounter (Signed)
Nurse Assessment Nurse: Quentin Cornwall, RN, Imari Date/Time (Eastern Time): 10/11/2021 8:19:09 PM Confirm and document reason for call. If symptomatic, describe symptoms. ---Caller states her husband was working at [employer] today and he was exhausted and went to be woke up at 7:30 pm and thought he it was morning, States not feeling well with bp of 188/81. Blood glucose was 154. Required assistance to get back in bed from bathroom. also is disoriented with a severe headache Does the patient have any new or worsening symptoms? ---Yes Will a triage be completed? ---Yes Related visit to physician within the last 2 weeks? ---Yes Does the PT have any chronic conditions? (i.e. diabetes, asthma, this includes High risk factors for pregnancy, etc.) ---Yes List chronic conditions. ---HTN, diabetes Is this a behavioral health or substance abuse call? ---No Guidelines Guideline Title Affirmed Question Affirmed Notes Nurse Date/Time (Eastern Time) Blood Pressure - High [7] Systolic BP >= 322 OR Diastolic >= 025 AND [4] cardiac (e.g., breathing difficulty, chest pain) or neurologic Quentin Cornwall, RN, Imari 10/11/2021 8:26:38 PM PLEASE NOTE: All timestamps contained within this report are represented as [place] Standard Time. CONFIDENTIALTY NOTICE: This fax transmission is intended only for the addressee. It contains information that is legally privileged, confidential or otherwise protected from use or disclosure. If you are not the intended recipient, you are strictly prohibited from reviewing, disclosing, copying using or disseminating any of this information or taking any action in reliance on or regarding this information. If you have received this fax in error, please notify us immediately by telephone so that we can arrange for its return to [place]. Phone: [phone], Toll-Free: [phone], Fax: [phone] Page: 2 of 2 Call Id: [id number] Guidelines Guideline Title Affirmed Question Affirmed  Notes Nurse Date/Time Eilene Ghazi Time) symptoms (e.g., new-onset blurred or double vision, unsteady gait) Disp. Time Eilene Ghazi Time) Disposition Final User 10/11/2021 8:27:58 PM Go to ED Now Yes Quentin Cornwall, RN, Imari Caller Disagree/Comply Comply Caller Understands Yes PreDisposition InappropriateToAsk Care Advice Given Per Guideline GO TO ED NOW: * You need to be seen in the Emergency Department. * Go to the ED at ___________ [place] now. Drive carefully. * Another adult should drive. * Patient should not delay going to the emergency department. NOTE TO TRIAGER - DRIVING: * Passes out or faints * Becomes confused * Becomes too weak to stand * You become worse CALL EMS 911 IF: CARE ADVICE given per High Blood Pressure (Adult) guideline. Comments User: Morton Stall, RN Date/Time Eilene Ghazi Time): 10/11/2021 8:24:52 PM is taking a new medication for diabetes and HTN, no longer taking potassium Referrals [facility] - ED

## 2021-10-16 ENCOUNTER — Emergency Department (HOSPITAL_COMMUNITY): Payer: Medicare Other

## 2021-10-16 ENCOUNTER — Other Ambulatory Visit: Payer: Self-pay

## 2021-10-16 ENCOUNTER — Emergency Department (HOSPITAL_COMMUNITY)
Admission: EM | Admit: 2021-10-16 | Discharge: 2021-10-17 | Disposition: A | Discharge status: Home / Self Care | Payer: Medicare Other | Attending: Emergency Medicine | Admitting: Emergency Medicine

## 2021-10-16 DIAGNOSIS — Z79899 Other long term (current) drug therapy: Secondary | ICD-10-CM | POA: Insufficient documentation

## 2021-10-16 DIAGNOSIS — E119 Type 2 diabetes mellitus without complications: Secondary | ICD-10-CM | POA: Diagnosis not present

## 2021-10-16 DIAGNOSIS — Z7984 Long term (current) use of oral hypoglycemic drugs: Secondary | ICD-10-CM | POA: Insufficient documentation

## 2021-10-16 DIAGNOSIS — I10 Essential (primary) hypertension: Secondary | ICD-10-CM | POA: Diagnosis not present

## 2021-10-16 DIAGNOSIS — G459 Transient cerebral ischemic attack, unspecified: Secondary | ICD-10-CM | POA: Diagnosis not present

## 2021-10-16 DIAGNOSIS — R519 Headache, unspecified: Secondary | ICD-10-CM | POA: Diagnosis not present

## 2021-10-16 DIAGNOSIS — I2510 Atherosclerotic heart disease of native coronary artery without angina pectoris: Secondary | ICD-10-CM | POA: Insufficient documentation

## 2021-10-16 DIAGNOSIS — Z7982 Long term (current) use of aspirin: Secondary | ICD-10-CM | POA: Insufficient documentation

## 2021-10-16 LAB — I-STAT CHEM 8, ED
BUN: 22 mg/dL (ref 8–23)
Calcium, Ion: 1.08 mmol/L — ABNORMAL LOW (ref 1.15–1.40)
Chloride: 104 mmol/L (ref 98–111)
Creatinine, Ser: 1.2 mg/dL (ref 0.61–1.24)
Glucose, Bld: 222 mg/dL — ABNORMAL HIGH (ref 70–99)
HCT: 38 % — ABNORMAL LOW (ref 39.0–52.0)
Hemoglobin: 12.9 g/dL — ABNORMAL LOW (ref 13.0–17.0)
Potassium: 3.5 mmol/L (ref 3.5–5.1)
Sodium: 141 mmol/L (ref 135–145)
TCO2: 24 mmol/L (ref 22–32)

## 2021-10-16 LAB — COMPREHENSIVE METABOLIC PANEL
ALT: 29 U/L (ref 0–44)
AST: 31 U/L (ref 15–41)
Albumin: 3.3 g/dL — ABNORMAL LOW (ref 3.5–5.0)
Alkaline Phosphatase: 55 U/L (ref 38–126)
Anion gap: 11 (ref 5–15)
BUN: 19 mg/dL (ref 8–23)
CO2: 23 mmol/L (ref 22–32)
Calcium: 8.6 mg/dL — ABNORMAL LOW (ref 8.9–10.3)
Chloride: 105 mmol/L (ref 98–111)
Creatinine, Ser: 1.18 mg/dL (ref 0.61–1.24)
GFR, Estimated: >60 mL/min (ref >60)
Glucose, Bld: 223 mg/dL — ABNORMAL HIGH (ref 70–99)
Potassium: 3.5 mmol/L (ref 3.5–5.1)
Sodium: 139 mmol/L (ref 135–145)
Total Bilirubin: 0.6 mg/dL (ref 0.3–1.2)
Total Protein: 6.7 g/dL (ref 6.5–8.1)

## 2021-10-16 LAB — DIFFERENTIAL
Abs Immature Granulocytes: 0.03 10*3/uL (ref 0.00–0.07)
Basophils Absolute: 0 10*3/uL (ref 0.0–0.1)
Basophils Relative: 0 %
Eosinophils Absolute: 0.6 10*3/uL — ABNORMAL HIGH (ref 0.0–0.5)
Eosinophils Relative: 7 %
Immature Granulocytes: 0 %
Lymphocytes Relative: 17 %
Lymphs Abs: 1.4 10*3/uL (ref 0.7–4.0)
Monocytes Absolute: 0.9 10*3/uL (ref 0.1–1.0)
Monocytes Relative: 11 %
Neutro Abs: 5.4 10*3/uL (ref 1.7–7.7)
Neutrophils Relative %: 65 %

## 2021-10-16 LAB — CBC
HCT: 39.5 % (ref 39.0–52.0)
Hemoglobin: 13.2 g/dL (ref 13.0–17.0)
MCH: 31.7 pg (ref 26.0–34.0)
MCHC: 33.4 g/dL (ref 30.0–36.0)
MCV: 94.7 fL (ref 80.0–100.0)
Platelets: 248 10*3/uL (ref 150–400)
RBC: 4.17 MIL/uL — ABNORMAL LOW (ref 4.22–5.81)
RDW: 12.5 % (ref 11.5–15.5)
WBC: 8.4 10*3/uL (ref 4.0–10.5)
nRBC: 0 % (ref 0.0–0.2)

## 2021-10-16 LAB — APTT: aPTT: 33 seconds (ref 24–36)

## 2021-10-16 LAB — PROTIME-INR
INR: 1.1 (ref 0.8–1.2)
Prothrombin Time: 14.2 seconds (ref 11.4–15.2)

## 2021-10-16 IMAGING — CT CT HEAD W/O CM
4 series · 16 of 47 positions shown, 18 images · non-contrast
Comparison: Head CT dated [DATE].

CLINICAL DATA: Transient ischemic attack.



[Series 3: head without · axial · non-contrast · 0.46mm/px · z∈[-61,+49]mm · 6 of 32 slices shown, 8 images]
[im 5/32  brain]
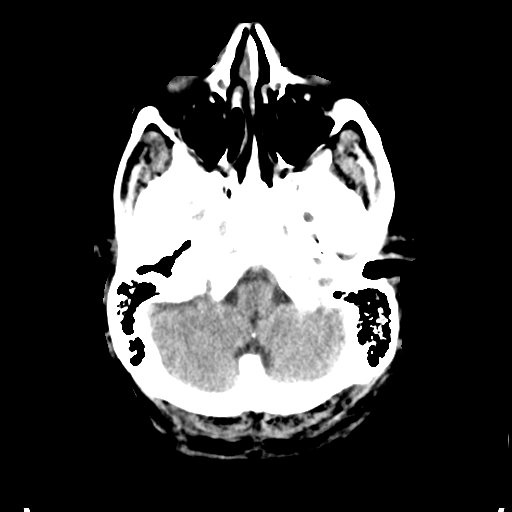
[im 5/32  bone]
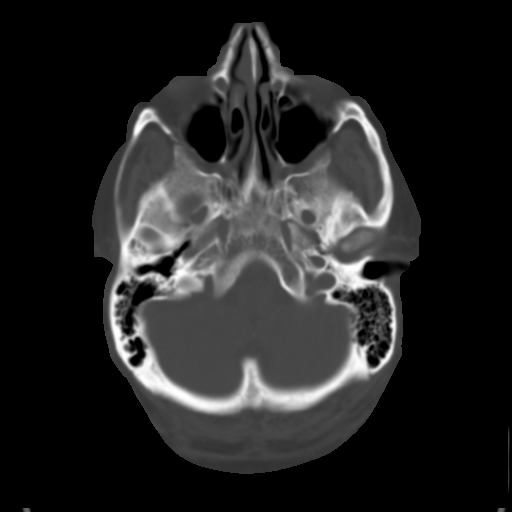
[im 9/32  brain]
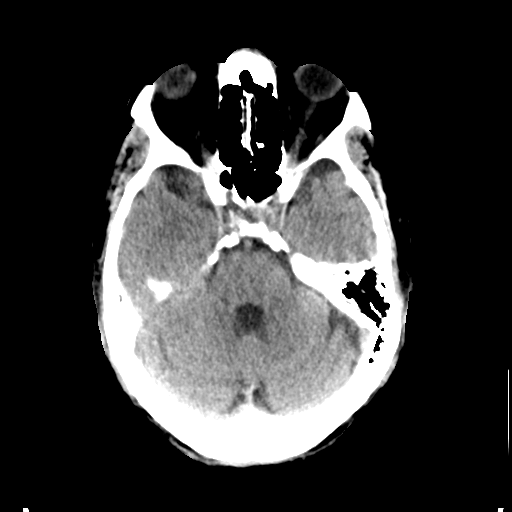
[im 14/32  brain]
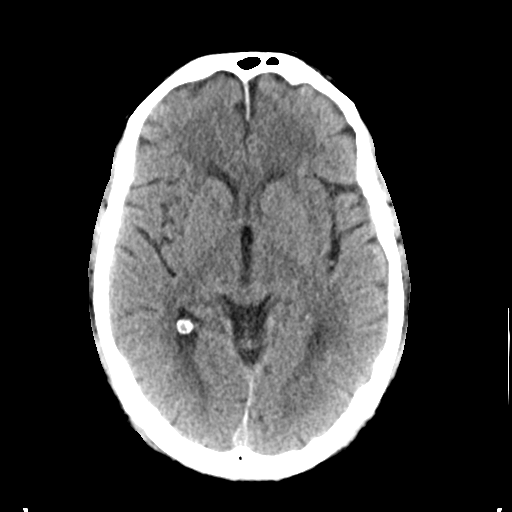
[im 18/32  brain]
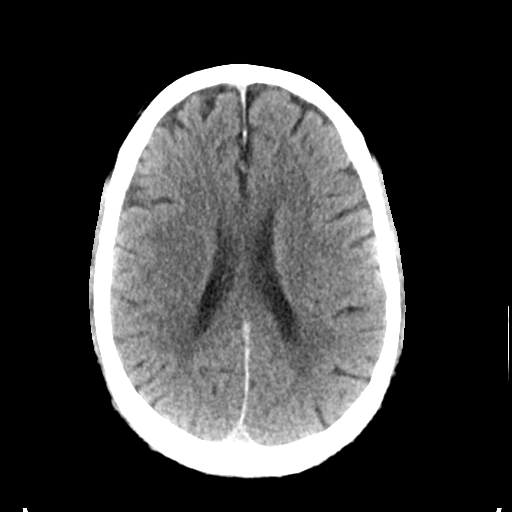
[im 23/32  brain]
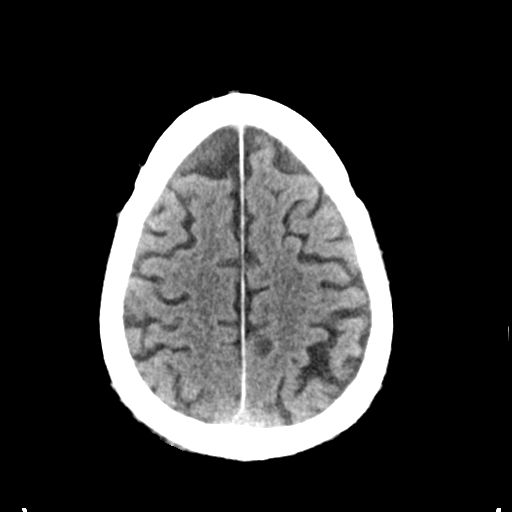
[im 23/32  bone]
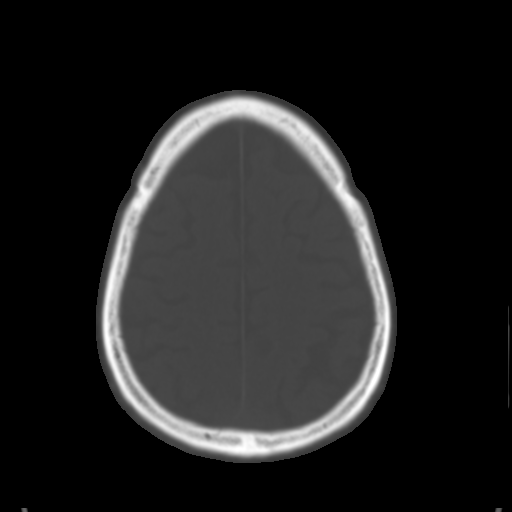
[im 27/32  brain]
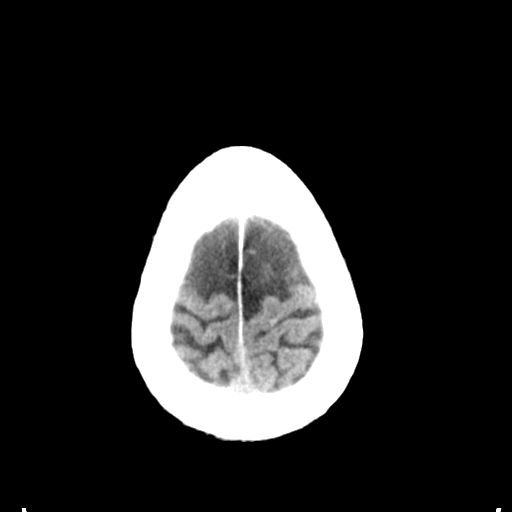

[Series 4: head bone · axial · 0.46mm/px · z∈[-67,-13]mm · 4 of 81 slices shown]
[im 8/81  bone]
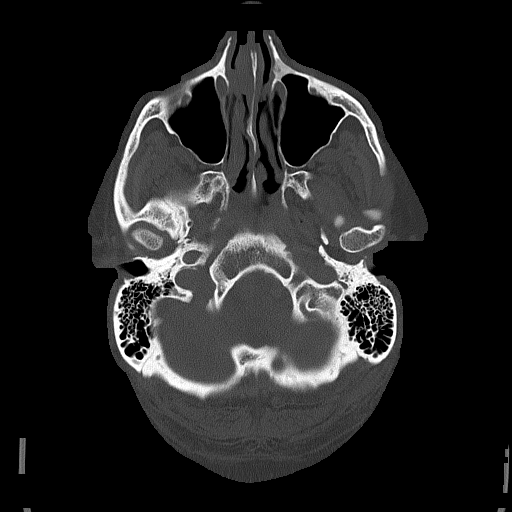
[im 16/81  bone]
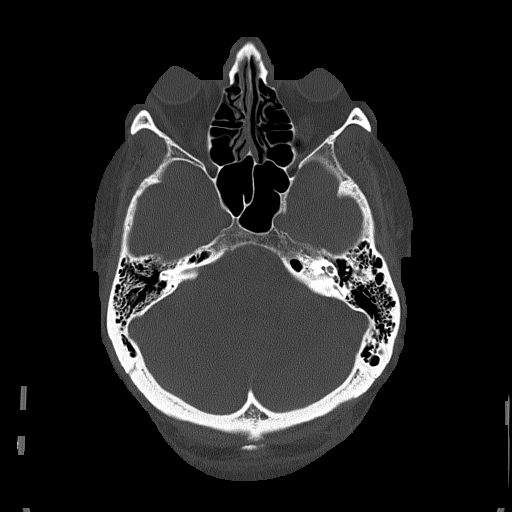
[im 27/81  bone]
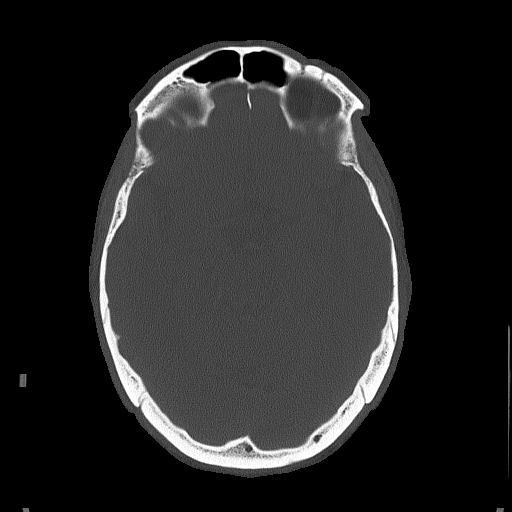
[im 35/81  bone]
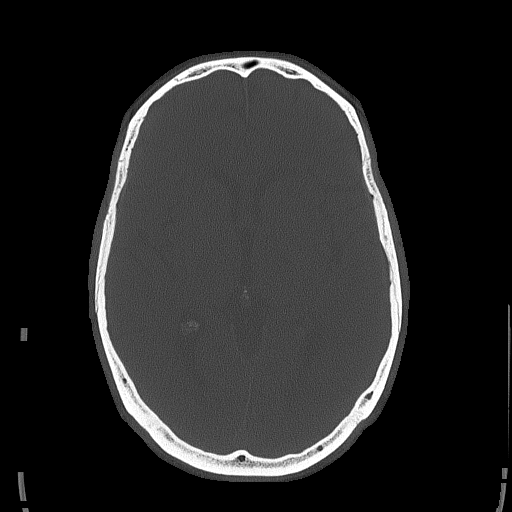

[Series 5: head without cor · coronal · non-contrast · 0.34mm/px · 3 of 73 slices shown]
[im 25/73  brain]
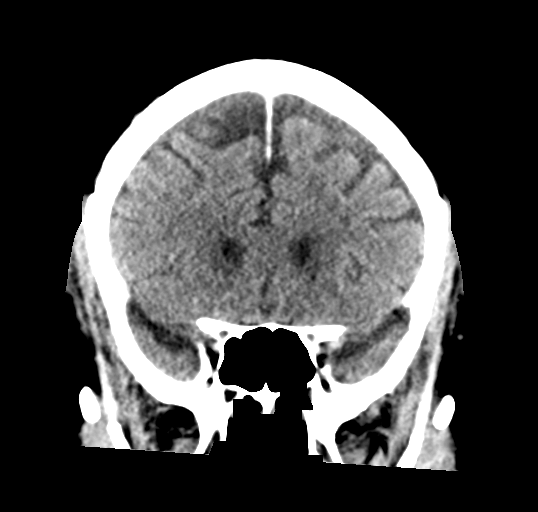
[im 33/73  brain]
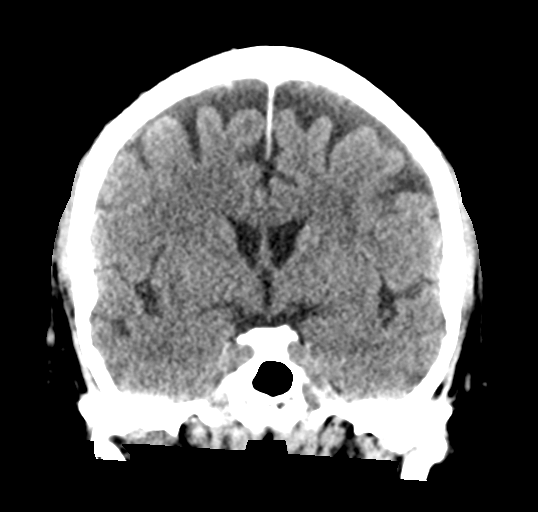
[im 41/73  brain]
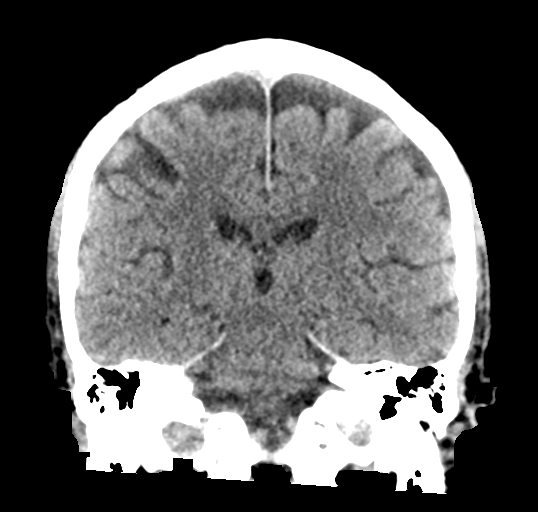

[Series 6: head without sag · sagittal · non-contrast · 0.34mm/px · 3 of 67 slices shown]
[im 23/67  brain]
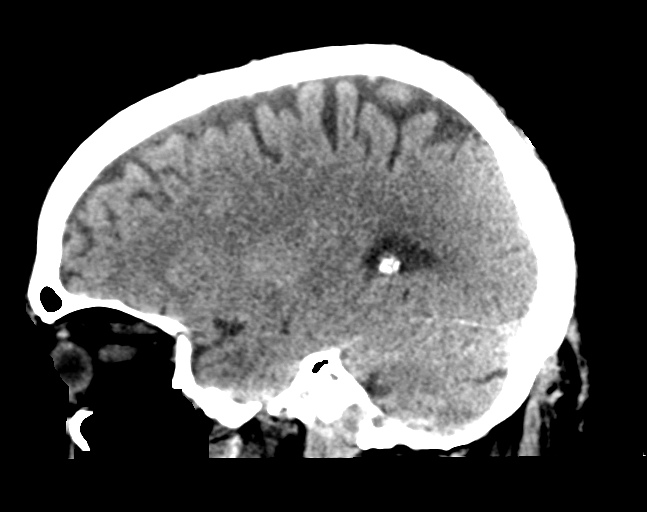
[im 34/67  brain]
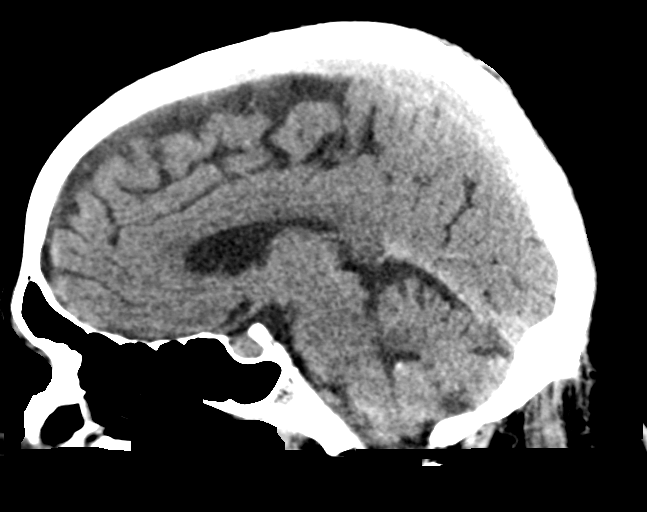
[im 45/67  brain]
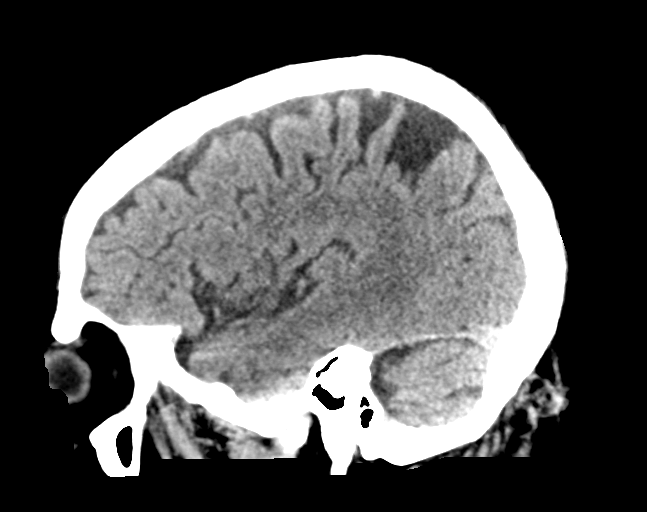

[16 of 47 positions shown; findings below may reference images not displayed]

FINDINGS: Brain: Mild age-related atrophy and chronic microvascular ischemic
changes. There is no acute intracranial hemorrhage. No mass effect
or midline shift. No extra-axial fluid collection.

Vascular: No hyperdense vessel or unexpected calcification.

Skull: Normal. Negative for fracture or focal lesion.

Sinuses/Orbits: The visualized paranasal sinuses and mastoid air
cells are clear. Mild bilateral exophthalmos.

Other: None
IMPRESSION: 1. No acute intracranial pathology.
2. Mild age-related atrophy and chronic microvascular ischemic
changes.

## 2021-10-16 MED ORDER — SODIUM CHLORIDE 0.9% FLUSH
3.0000 mL | Freq: Once | INTRAVENOUS | Status: AC
  Administered 2021-10-16: 3 mL via INTRAVENOUS

## 2021-10-16 MED ORDER — METOCLOPRAMIDE HCL 5 MG/ML IJ SOLN
5.0000 mg | INTRAMUSCULAR | Status: AC
  Administered 2021-10-16: 5 mg via INTRAVENOUS
  Filled 2021-10-16: qty 2

## 2021-10-16 MED ORDER — DIPHENHYDRAMINE HCL 50 MG/ML IJ SOLN
12.5000 mg | Freq: Once | INTRAMUSCULAR | Status: AC
  Administered 2021-10-16: 12.5 mg via INTRAVENOUS
  Filled 2021-10-16: qty 1

## 2021-10-16 NOTE — ED Notes (Signed)
Patient transported to CT 

## 2021-10-16 NOTE — ED Notes (Addendum)
Pt stated he is waiting to speak to an EDP before getting a CT because he stated he just had a MRI and CT scan appx 4 days ago per pt.

## 2021-10-16 NOTE — ED Provider Notes (Incomplete)
[facility] EMERGENCY DEPARTMENT Provider Note   CSN: [id number] Arrival date & time: 10/16/21  2056     History {Add pertinent medical, surgical, social history, OB history to HPI:1} Chief Complaint  Patient presents with   Hypertension    Carl Knapp is a 76 y.o. male.  The history is provided by the patient and medical records.  Hypertension       Home Medications Prior to Admission medications   Medication Sig Start Date End Date Taking? Authorizing Provider  acetaminophen (TYLENOL) 500 MG tablet Take 1,000 mg by mouth daily as needed for moderate pain or headache.    [provider]  aspirin EC 81 MG tablet Take 81 mg by mouth daily.    [provider]  atorvastatin (LIPITOR) 40 MG tablet Take 1 tablet (40 mg total) by mouth daily. 08/19/21   Sueanne Margarita, MD  doxycycline (VIBRAMYCIN) 100 MG capsule Take 1 capsule (100 mg total) by mouth 2 (two) times daily. One po bid x 7 days 10/12/21   Mesner, Corene Cornea, MD  ketoconazole (NIZORAL) 2 % cream Apply 1 application. topically 2 (two) times daily as needed for irritation (eczema).    Colon Branch, MD  loratadine (CLARITIN) 10 MG tablet Take 10 mg by mouth daily as needed for allergies.    [provider]  losartan (COZAAR) 100 MG tablet Take 1 tablet (100 mg total) by mouth daily. 08/19/21   Sueanne Margarita, MD  metFORMIN (GLUCOPHAGE) 1000 MG tablet Take 1,000 mg by mouth 2 (two) times daily with a meal.  05/04/15   [provider]  Multiple Vitamins-Minerals (PRESERVISION AREDS 2) CAPS Take 1 capsule by mouth in the morning and at bedtime.    [provider]  multivitamin (ONE-A-DAY MEN'S) TABS tablet Take 1 tablet by mouth daily.    [provider]  Omega-3 Fatty Acids (FISH OIL) 1000 MG CAPS Take 1 capsule by mouth in the morning and at bedtime.    [provider]  pantoprazole (PROTONIX) 40 MG tablet Take 1 tablet (40 mg total) by mouth daily  before breakfast. 09/21/21   Colon Branch, MD  pioglitazone (ACTOS) 30 MG tablet Take 1 tablet (30 mg total) by mouth daily. 09/21/21   Colon Branch, MD  sildenafil (REVATIO) 20 MG tablet Take 40-60 mg by mouth daily as needed (erectile dysfunction).    [provider]  triamcinolone cream (KENALOG) 0.1 % Apply 1 application. topically 2 (two) times daily as needed (eczema).    Colon Branch, MD      Allergies    Ozempic (0.25 or 0.5 mg-dose) [semaglutide(0.25 or 0.'5mg'$ -dos)] and Simvastatin    Review of Systems   Review of Systems  Physical Exam Updated Vital Signs BP (!) 181/76   Pulse 64   Temp 98 F (36.7 C)   Resp 19   SpO2 96%  Physical Exam  ED Results / Procedures / Treatments   Labs (all labs ordered are listed, but only abnormal results are displayed) Labs Reviewed  CBC - Abnormal; Notable for the following components:      Result Value   RBC 4.17 (*)    All other components within normal limits  DIFFERENTIAL - Abnormal; Notable for the following components:   Eosinophils Absolute 0.6 (*)    All other components within normal limits  I-STAT CHEM 8, ED - Abnormal; Notable for the following components:   Glucose, Bld 222 (*)  Calcium, Ion 1.08 (*)    Hemoglobin 12.9 (*)    HCT 38.0 (*)    All other components within normal limits  PROTIME-INR  APTT  COMPREHENSIVE METABOLIC PANEL  CBG MONITORING, ED    EKG None  Radiology No results found.  Procedures Procedures  {Document cardiac monitor, telemetry assessment procedure when appropriate:1}  Medications Ordered in ED Medications  sodium chloride flush (NS) 0.9 % injection 3 mL (3 mLs Intravenous Given 10/16/21 2158)    ED Course/ Medical Decision Making/ A&P                           Medical Decision Making Amount and/or Complexity of Data Reviewed Labs: ordered. Radiology: ordered.   ***  {Document critical care time when appropriate:1} {Document review of labs and clinical decision  tools ie heart score, Chads2Vasc2 etc:1}  {Document your independent review of radiology images, and any outside records:1} {Document your discussion with family members, caretakers, and with consultants:1} {Document social determinants of health affecting pt's care:1} {Document your decision making why or why not admission, treatments were needed:1} Final Clinical Impression(s) / ED Diagnoses Final diagnoses:  None    Rx / DC Orders ED Discharge Orders     None

## 2021-10-16 NOTE — ED Triage Notes (Addendum)
Pt reported to ED with c/o headache and elevated blood pressure. Pt states as been ongoing and he is complaining of headache and head fullness and at times he feels dizzy. States he had a similar event this past Tuesday and was diagnosed with TIA.

## 2021-10-16 NOTE — ED Provider Notes (Signed)
[facility] EMERGENCY DEPARTMENT Provider Note   CSN: [id number] Arrival date & time: 10/16/21  2056     History {Add pertinent medical, surgical, social history, OB history to HPI:1} Chief Complaint  Patient presents with   Hypertension    Carl Knapp is a 76 y.o. male.  The history is provided by the patient and medical records.  Hypertension Associated symptoms include headaches.   76 y.o. M with hx of adjustment disorder, CAD, HTN, macular degeneration, HLD, DM2, presenting to the ED for headache and high blood pressure.  Patient states all day today he has had a headache, more so localized along back of his head and along base of the skull.  He denies a throbbing type sensation but rather a dull, ache.  States initially he did have some blurred vision, however that is since resolved.  States he checked his blood pressure at home and was in the 200s.  He called nurse at his PCP office who urged him to have ER evaluation.  He denies any dizziness, confusion, numbness, or weakness.  He has not had any difficulty speaking, swallowing, or ambulating.  He was seen here 10/13/21 and had CTA head/neck along with MRI which were all reassuring and diagnosed with likely TIA.  He has arranged appt for OP follow-up but has not seen his doctor yet.  He took tylenol around dinner time without change in headache.  Home Medications Prior to Admission medications   Medication Sig Start Date End Date Taking? Authorizing Provider  acetaminophen (TYLENOL) 500 MG tablet Take 1,000 mg by mouth daily as needed for moderate pain or headache.    [provider]  aspirin EC 81 MG tablet Take 81 mg by mouth daily.    [provider]  atorvastatin (LIPITOR) 40 MG tablet Take 1 tablet (40 mg total) by mouth daily. 08/19/21   Sueanne Margarita, MD  doxycycline (VIBRAMYCIN) 100 MG capsule Take 1 capsule (100 mg total) by mouth 2 (two) times daily. One po bid x 7 days 10/12/21    Mesner, Corene Cornea, MD  ketoconazole (NIZORAL) 2 % cream Apply 1 application. topically 2 (two) times daily as needed for irritation (eczema).    Colon Branch, MD  loratadine (CLARITIN) 10 MG tablet Take 10 mg by mouth daily as needed for allergies.    [provider]  losartan (COZAAR) 100 MG tablet Take 1 tablet (100 mg total) by mouth daily. 08/19/21   Sueanne Margarita, MD  metFORMIN (GLUCOPHAGE) 1000 MG tablet Take 1,000 mg by mouth 2 (two) times daily with a meal.  05/04/15   [provider]  Multiple Vitamins-Minerals (PRESERVISION AREDS 2) CAPS Take 1 capsule by mouth in the morning and at bedtime.    [provider]  multivitamin (ONE-A-DAY MEN'S) TABS tablet Take 1 tablet by mouth daily.    [provider]  Omega-3 Fatty Acids (FISH OIL) 1000 MG CAPS Take 1 capsule by mouth in the morning and at bedtime.    [provider]  pantoprazole (PROTONIX) 40 MG tablet Take 1 tablet (40 mg total) by mouth daily before breakfast. 09/21/21   Colon Branch, MD  pioglitazone (ACTOS) 30 MG tablet Take 1 tablet (30 mg total) by mouth daily. 09/21/21   Colon Branch, MD  sildenafil (REVATIO) 20 MG tablet Take 40-60 mg by mouth daily as needed (erectile dysfunction).    [provider]  triamcinolone cream (KENALOG) 0.1 % Apply 1 application. topically  2 (two) times daily as needed (eczema).    Colon Branch, MD      Allergies    Ozempic (0.25 or 0.5 mg-dose) [semaglutide(0.25 or 0.'5mg'$ -dos)] and Simvastatin    Review of Systems   Review of Systems  Neurological:  Positive for headaches.  All other systems reviewed and are negative.   Physical Exam Updated Vital Signs BP (!) 181/76   Pulse 64   Temp 98 F (36.7 C)   Resp 19   SpO2 96%   Physical Exam Vitals and nursing note reviewed.  Constitutional:      General: He is not in acute distress.    Appearance: He is well-developed. He is not diaphoretic.  HENT:     Head: Normocephalic and atraumatic.      Right Ear: External ear normal.     Left Ear: External ear normal.  Eyes:     Conjunctiva/sclera: Conjunctivae normal.     Pupils: Pupils are equal, round, and reactive to light.     Comments: PERRL, EOMs intact, normal confrontation, no field cuts  Neck:     Comments: No rigidity, no meningismus Cardiovascular:     Rate and Rhythm: Normal rate and regular rhythm.     Heart sounds: Normal heart sounds. No murmur heard. Pulmonary:     Effort: Pulmonary effort is normal. No respiratory distress.     Breath sounds: Normal breath sounds. No wheezing or rhonchi.  Abdominal:     General: Bowel sounds are normal.     Palpations: Abdomen is soft.     Tenderness: There is no abdominal tenderness. There is no guarding.  Musculoskeletal:        General: Normal range of motion.     Cervical back: Full passive range of motion without pain, normal range of motion and neck supple. No rigidity.  Skin:    General: Skin is warm and dry.     Findings: No rash.  Neurological:     Mental Status: He is alert and oriented to person, place, and time.     Cranial Nerves: No cranial nerve deficit.     Sensory: No sensory deficit.     Motor: No tremor or seizure activity.     Comments: AAOx3, answering questions and following commands appropriately; equal strength UE and LE bilaterally; CN grossly intact; moves all extremities appropriately without ataxia; no dysmetria with finger-nose-finger bilaterally, no focal neuro deficits or facial asymmetry appreciated  Psychiatric:        Behavior: Behavior normal.        Thought Content: Thought content normal.     ED Results / Procedures / Treatments   Labs (all labs ordered are listed, but only abnormal results are displayed) Labs Reviewed  CBC - Abnormal; Notable for the following components:      Result Value   RBC 4.17 (*)    All other components within normal limits  DIFFERENTIAL - Abnormal; Notable for the following components:   Eosinophils  Absolute 0.6 (*)    All other components within normal limits  I-STAT CHEM 8, ED - Abnormal; Notable for the following components:   Glucose, Bld 222 (*)    Calcium, Ion 1.08 (*)    Hemoglobin 12.9 (*)    HCT 38.0 (*)    All other components within normal limits  PROTIME-INR  APTT  COMPREHENSIVE METABOLIC PANEL  CBG MONITORING, ED    EKG None  Radiology No results found.  Procedures Procedures  {Document cardiac monitor,  telemetry assessment procedure when appropriate:1}  Medications Ordered in ED Medications  sodium chloride flush (NS) 0.9 % injection 3 mL (3 mLs Intravenous Given 10/16/21 2158)    ED Course/ Medical Decision Making/ A&P                           Medical Decision Making Amount and/or Complexity of Data Reviewed Labs: ordered. Radiology: ordered.   ***  {Document critical care time when appropriate:1} {Document review of labs and clinical decision tools ie heart score, Chads2Vasc2 etc:1}  {Document your independent review of radiology images, and any outside records:1} {Document your discussion with family members, caretakers, and with consultants:1} {Document social determinants of health affecting pt's care:1} {Document your decision making why or why not admission, treatments were needed:1} Final Clinical Impression(s) / ED Diagnoses Final diagnoses:  None    Rx / DC Orders ED Discharge Orders     None

## 2021-10-17 ENCOUNTER — Ambulatory Visit (INDEPENDENT_AMBULATORY_CARE_PROVIDER_SITE_OTHER): Payer: Medicare Other | Admitting: Physician Assistant

## 2021-10-17 ENCOUNTER — Other Ambulatory Visit: Payer: Self-pay

## 2021-10-17 ENCOUNTER — Telehealth: Payer: Self-pay

## 2021-10-17 DIAGNOSIS — M5441 Lumbago with sciatica, right side: Secondary | ICD-10-CM

## 2021-10-17 DIAGNOSIS — G8929 Other chronic pain: Secondary | ICD-10-CM | POA: Diagnosis not present

## 2021-10-17 MED ORDER — TRAMADOL HCL 50 MG PO TABS: 50.0000 mg | ORAL_TABLET | Freq: Two times a day (BID) | ORAL | 2 refills | Status: DC | PRN

## 2021-10-17 MED ORDER — METHOCARBAMOL 500 MG PO TABS: 500.0000 mg | ORAL_TABLET | Freq: Two times a day (BID) | ORAL | 2 refills | Status: DC | PRN

## 2021-10-17 NOTE — Telephone Encounter (Signed)
Initial Comment Caller states his blood pressure just spiked to 207/99 and he recently had a TIA and it was high then as well. He wants to know what to do before he can make an appt. Translation No Nurse Assessment Nurse: Laurance Flatten, RN, Geni Bers Date/Time (Eastern Time): 10/16/2021 8:28:51 PM Confirm and document reason for call. If symptomatic, describe symptoms. ---Caller stated that last Tuesday he was in the ED for a possible TIA. He stated his BP is now at 207/99 and he has a bad headache. Does the patient have any new or worsening symptoms? ---Yes Will a triage be completed? ---Yes Related visit to physician within the last 2 weeks? ---No Does the PT have any chronic conditions? (i.e. diabetes, asthma, this includes High risk factors for pregnancy, etc.) ---Yes List chronic conditions. ---HTN, diabetes type 2 Is this a behavioral health or substance abuse call? ---No Guidelines Guideline Title Affirmed Question Affirmed Notes Nurse Date/Time (Eastern Time) Blood Pressure - High [9] Systolic BP >= 935 OR Diastolic >= 701 AND [7] cardiac (e.g., breathing difficulty, chest pain) or neurologic symptoms (e.g., new-onset blurred Laurance Flatten, RN, Geni Bers 10/16/2021 8:30:27 PM PLEASE NOTE: All timestamps contained within this report are represented as [place] Standard Time. CONFIDENTIALTY NOTICE: This fax transmission is intended only for the addressee. It contains information that is legally privileged, confidential or otherwise protected from use or disclosure. If you are not the intended recipient, you are strictly prohibited from reviewing, disclosing, copying using or disseminating any of this information or taking any action in reliance on or regarding this information. If you have received this fax in error, please notify us immediately by telephone so that we can arrange for its return to [place]. Phone: [phone], Toll-Free: [phone], Fax: [phone] Page: 2 of 2 Call  Id: [id number] Guidelines Guideline Title Affirmed Question Affirmed Notes Nurse Date/Time Eilene Ghazi Time) or double vision, unsteady gait) Disp. Time Eilene Ghazi Time) Disposition Final User 10/16/2021 8:26:59 PM Send to Urgent Queue Ding[place], Rip Harbour 10/16/2021 8:33:25 PM Go to ED Now Yes Laurance Flatten, RN, Althea Grimmer Disagree/Comply Comply Caller Understands Yes PreDisposition InappropriateToAsk Care Advice Given Per Guideline GO TO ED NOW: * You need to be seen in the Emergency Department. * Go to the ED at ___________ [place] now. Drive carefully. NOTE TO TRIAGER - DRIVING: * Another adult should drive. * Patient should not delay going to the emergency department. * If immediate transportation is not available via car, rideshare (e.g., Lyft, Uber), or taxi, then the patient should be instructed to call [id number]. CALL EMS 911 IF: * Passes out or faints * Becomes confused * Becomes too weak to stand * You become worse Referrals [facility] - ED

## 2021-10-17 NOTE — ED Notes (Signed)
Patient verbalizes understanding of d/c instructions. Opportunities for questions and answers were provided. Pt d/c from ED and ambulated to lobby with wife.  

## 2021-10-17 NOTE — Discharge Instructions (Signed)
I have placed referral for neurology visit.  If you do not hear from them in the next few days, please follow-up to make sure appointment gets scheduled. Please follow-up with your primary care doctor in the interim. Return here for any new or acute changes--extremity numbness, weakness, trouble speaking, trouble walking, etc.

## 2021-10-17 NOTE — Telephone Encounter (Signed)
Pt seen in ED 

## 2021-10-17 NOTE — Progress Notes (Signed)
Office Visit Note   Patient: Carl Knapp           Date of Birth: [birth year]           MRN: [id number] Visit Date: 10/17/2021              Requested by: Colon Branch, MD [address] [place],  [place] [address] PCP: Colon Branch, MD   Assessment & Plan: Visit Diagnoses:  1. Chronic right-sided low back pain with right-sided sciatica     Plan: Impression is chronic right-sided low back pain with right lower extremity radiculopathy.  Recent MRI findings consistent with the patient's symptoms.  I would like to make referral to Dr. Ernestina Patches [facility].  I have sent in refills of Robaxin and have sent in a prescription of tramadol for his current pain.  He has been dealing with hypertension recently so I have discussed not refilling the Sterapred.  He will follow-up with [place] as needed.  Follow-Up Instructions: Return if symptoms worsen or fail to improve.   Orders:  No orders of the defined types were placed in this encounter.  Meds ordered this encounter  Medications   methocarbamol (ROBAXIN) 500 MG tablet    Sig: Take 1 tablet (500 mg total) by mouth 2 (two) times daily as needed for muscle spasms.    Dispense:  20 tablet    Refill:  2   traMADol (ULTRAM) 50 MG tablet    Sig: Take 1 tablet (50 mg total) by mouth 2 (two) times daily as needed.    Dispense:  30 tablet    Refill:  2      Procedures: No procedures performed   Clinical Data: No additional findings.   Subjective: Chief Complaint  Patient presents with   Lower Back - Pain    HPI patient is a pleasant 76 year old gentleman who comes in today to discuss MRI results of his lumbar spine.  He has been seen by [place] multiple times over the past year or so with chronic right-sided low back pain and right lower extremity radiculopathy.  He has been to physical therapy and has tried steroids and muscle relaxers with good but temporary relief.  Subsequent MRI of the lumbar spine ordered which showed multilevel  degenerative changes worse at L4-5 with spinal stenosis throughout the lumbar spine from L2-S1.     Objective: Vital Signs: There were no vitals taken for this visit.    Ortho Exam stable lumbar spine exam  Specialty Comments:  No specialty comments available.  Imaging: CT HEAD WO CONTRAST  Result Date: 10/16/2021 CLINICAL DATA:  Transient ischemic attack. EXAM: CT HEAD WITHOUT CONTRAST TECHNIQUE: Contiguous axial images were obtained from the base of the skull through the vertex without intravenous contrast. RADIATION DOSE REDUCTION: This exam was performed according to the departmental dose-optimization program which includes automated exposure control, adjustment of the mA and/or kV according to patient size and/or use of iterative reconstruction technique. COMPARISON:  Head CT dated 10/11/2021. FINDINGS: Brain: Mild age-related atrophy and chronic microvascular ischemic changes. There is no acute intracranial hemorrhage. No mass effect or midline shift. No extra-axial fluid collection. Vascular: No hyperdense vessel or unexpected calcification. Skull: Normal. Negative for fracture or focal lesion. Sinuses/Orbits: The visualized paranasal sinuses and mastoid air cells are clear. Mild bilateral exophthalmos. Other: None IMPRESSION: 1. No acute intracranial pathology. 2. Mild age-related atrophy and chronic microvascular ischemic changes. Electronically Signed   By: Anner Crete  M.D.   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   Chronic kidney disease (CKD) stage G3a/A1, moderately decreased glomerular filtration rate (GFR)  between 45-59 mL/min/1.73 square meter and albuminuria creatinine ratio less than 30 mg/g (HCC) 09/14/2021   Adjustment disorder 09/14/2021   History of DVT (deep vein thrombosis) 09/14/2021   Atrial tachycardia, paroxysmal ([place]) 08/10/2021   PVC's (premature ventricular contractions) 08/10/2021   Type 2 diabetes mellitus ([place]) 09/29/2019   CAD (coronary artery disease) 01/20/2014   HLD (hyperlipidemia) 01/02/2014   Obstructive sleep apnea 05/07/2013   Obesity (BMI 30-39.9) 05/07/2013   Essential hypertension, benign 05/07/2013   Past Medical History:  Diagnosis Date   Allergy    Arthritis    Atrial tachycardia, paroxysmal ([place])    Noted to have up to 14 beats in a row of atrial tachycardia on event monitor for/2023   Chronic kidney disease    Coronary artery disease 12/2013   20-30% RCA by cath- states was good.   Diabetes mellitus without complication ([place])    type II   Drug-induced myopathy    DVT (deep venous thrombosis) (HCC)    bilateral legs-greater on left. -3-5 years ago-tx. coumadin x 1 year.   Eczema    Erectile dysfunction    GERD (gastroesophageal reflux disease)    Hx of cardiovascular stress test    ETT-Myoview (7/15):  ECG with ST depression; freq PVCs, + chest pain; normal perfusion   Hyperlipidemia    LDL goal<100   Hypertension    Macular degeneration    mild   OSA (obstructive sleep apnea)    Severe w AHI 37/hr now on CPAP at 10cm H2O   PVC's (premature ventricular contractions)    Isolated PVCs, ventricular couplets and triplets, bigeminal and trigeminal PVCs with PVC load less than 1% on event monitor 08/2021    Family History  Problem Relation Age of Onset   Colon cancer Mother    CAD Father    AAA (abdominal aortic aneurysm) Father    Heart attack Father    Arthritis Father    Lung cancer Father    Hypertension Father    Lung cancer Maternal Grandmother    Stroke Maternal Grandfather 66   Cancer Paternal Grandmother    Heart attack Paternal  Grandfather 33   Hypertension Other    Prostate cancer Neg Hx     Past Surgical History:  Procedure Laterality Date   APPENDECTOMY     open '68   HERNIA REPAIR Bilateral 1980   inguinal   INSERTION OF MESH N/A 06/14/2017   Procedure: INSERTION OF MESH;  Surgeon: Alphonsa Overall, MD;  Location: [facility];  Service: General;  Laterality: N/A;   LEFT HEART CATHETERIZATION WITH CORONARY ANGIOGRAM N/A 12/09/2013   Procedure: LEFT HEART CATHETERIZATION WITH CORONARY ANGIOGRAM;  Surgeon: Jettie Booze, MD;  Location: [facility] C[facility] LAB;  Service: Cardiovascular;  Laterality: N/A;   TONSILLECTOMY     UMBILICAL HERNIA REPAIR N/A 06/14/2017   Procedure: LAPAROSCOPIC UMBILICAL HERNIA REPAIR WITH MESH ERAS PATHWAY;  Surgeon: Alphonsa Overall, MD;  Location: [facility];  Service: General;  Laterality: N/A;  ERAS PATHWAY  VASECTOMY     Social History   Occupational History   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

## 2021-10-18 ENCOUNTER — Encounter: Payer: Self-pay | Admitting: Medical

## 2021-10-18 ENCOUNTER — Telehealth: Payer: Self-pay | Admitting: Medical

## 2021-10-18 ENCOUNTER — Ambulatory Visit (INDEPENDENT_AMBULATORY_CARE_PROVIDER_SITE_OTHER): Payer: Medicare Other | Admitting: Medical

## 2021-10-18 VITALS — BP 168/80 | HR 70 | Temp 97.9°F | Resp 16 | Ht 70.0 in | Wt 224.8 lb

## 2021-10-18 DIAGNOSIS — R739 Hyperglycemia, unspecified: Secondary | ICD-10-CM | POA: Diagnosis not present

## 2021-10-18 DIAGNOSIS — I10 Essential (primary) hypertension: Secondary | ICD-10-CM

## 2021-10-18 DIAGNOSIS — I2510 Atherosclerotic heart disease of native coronary artery without angina pectoris: Secondary | ICD-10-CM | POA: Diagnosis not present

## 2021-10-18 DIAGNOSIS — G459 Transient cerebral ischemic attack, unspecified: Secondary | ICD-10-CM | POA: Diagnosis not present

## 2021-10-18 DIAGNOSIS — D72829 Elevated white blood cell count, unspecified: Secondary | ICD-10-CM

## 2021-10-18 MED ORDER — AMLODIPINE BESYLATE 10 MG PO TABS: 10.0000 mg | ORAL_TABLET | Freq: Every day | ORAL | 0 refills | Status: DC

## 2021-10-18 NOTE — Progress Notes (Signed)
Subjective:    Patient ID: Carl Knapp, male    DOB: 20-Oct-1945, 76 y.o.   MRN: [id number]  HPI  Pt in for follow up from the ED.  Pt seen   "Hypertension Associated symptoms include headaches.    76 y.o. M with hx of adjustment disorder, CAD, HTN, macular degeneration, HLD, DM2, presenting to the ED for headache and high blood pressure.  Patient states all day today he has had a headache, more so localized along back of his head and along base of the skull.  He denies a throbbing type sensation but rather a dull, ache.  States initially he did have some blurred vision, however that is since resolved.  States he checked his blood pressure at home and was in the 200s.  He called nurse at his PCP office who urged him to have ER evaluation.  He denies any dizziness, confusion, numbness, or weakness.  He has not had any difficulty speaking, swallowing, or ambulating.  He was seen here 10/13/21 and had CTA head/neck along with MRI which were all reassuring and diagnosed with likely TIA.  He has arranged appt for OP follow-up but has not seen his doctor yet.  He took tylenol around dinner time without change in headache   Physical Exam Updated Vital Signs BP (!) 181/76   Pulse 64   Temp 98 F (36.7 C)   Resp 19   SpO2 96%    Physical Exam Vitals and nursing note reviewed.  Constitutional:      General: He is not in acute distress.    Appearance: He is well-developed. He is not diaphoretic.  HENT:     Head: Normocephalic and atraumatic.     Right Ear: External ear normal.     Left Ear: External ear normal.  Eyes:     Conjunctiva/sclera: Conjunctivae normal.     Pupils: Pupils are equal, round, and reactive to light.     Comments: PERRL, EOMs intact, normal confrontation, no field cuts  Neck:     Comments: No rigidity, no meningismus Cardiovascular:     Rate and Rhythm: Normal rate and regular rhythm.     Heart sounds: Normal heart sounds. No murmur heard. Pulmonary:     Effort:  Pulmonary effort is normal. No respiratory distress.     Breath sounds: Normal breath sounds. No wheezing or rhonchi.  Abdominal:     General: Bowel sounds are normal.     Palpations: Abdomen is soft.     Tenderness: There is no abdominal tenderness. There is no guarding.  Musculoskeletal:        General: Normal range of motion.     Cervical back: Full passive range of motion without pain, normal range of motion and neck supple. No rigidity.  Skin:    General: Skin is warm and dry.     Findings: No rash.  Neurological:     Mental Status: He is alert and oriented to person, place, and time.     Cranial Nerves: No cranial nerve deficit.     Sensory: No sensory deficit.     Motor: No tremor or seizure activity.     Comments: AAOx3, answering questions and following commands appropriately; equal strength UE and LE bilaterally; CN grossly intact; moves all extremities appropriately without ataxia; no dysmetria with finger-nose-finger bilaterally, no focal neuro deficits or facial asymmetry appreciated  Psychiatric:        Behavior: Behavior normal.  Thought Content: Thought content normal.   ED Course/ Medical Decision Making/ A&P                         Medical Decision Making Amount and/or Complexity of Data Reviewed Labs: ordered. Radiology: ordered and independent interpretation performed. ECG/medicine tests: ordered and independent interpretation performed.   Risk Prescription drug management.     76 year old male presenting to the ED with headache.  Dull, aching pain along posterior head and along base of skull.  He reports transient blurred vision that is since resolved.  No focal numbness or weakness.  BP at home was 220s, spoke with RN from PCP office and encouraged ER evaluation.  He is awake, alert, fully oriented on my exam.  He has no focal neurologic deficits.  His speech is clear and goal oriented.  BP has down trended from time of arrival without acute intervention.   Patient with recent ED visit for likely TIA, had negative CTA and MRI 10/11/21 (6 days ago).  Labs and screening head CT ordered from triage.  He chose to defer head CT until speaking with provider.  After discussing recommendation for repeat head CT given new headache, he is agreeable.  Labs are reassuring without profound anemia or significant electrolyte derangement.  Head CT pending.  Will give migraine cocktail and reassess.   12:52 AM On recheck, patient states he is feeling significantly better.  He remains without any focal neurologic deficit.  CT reviewed and is negative for any acute findings.  Given isolated headache today without period of confusion or other concerning symptoms, do not feel this represents acute TIA/CVA.  Feel he is stable for discharge home.  He will follow-up with PCP, also will provide referral to neurology.  Encouraged to return here for any new or acute changes."  On 10/11/2021 he had both CT head and mri. Both were negative. He was evaluated in the ED for iniitial confustion.   On review of most recent ED visit headache cocktail med given. No bp med adjustment given. Pt is on losartarn 100 mg dailly.  Presently low level ha 1/10 and fatigue.   Pt states last Tuesday dx with pneumonia.  On review of pt ED note on d6-10-2021 MD made mention of TIA work up. WBC 16.8.   Ct of neck   IMPRESSION:  1. No emergent large vessel occlusion or high-grade stenosis of the intracranial or cervical arteries. 2. Emphysema (ICD10-J43.9).   Pt is on 81 mg aspirin.  2 months ago. Lipid panel tightly controlled. On atorvastatin 40 mg daily.   Diabetes- on metformin and actos. Last a1c was 7.0. At home sugar levels 150.   On review pt bp was 175/80.  Pt saw Dr. Radford Pax in past. Pt used to be on amlodipine and lisinopril/hctz.  Review of Systems  Constitutional:  Negative for chills, fatigue and fever.  Respiratory:  Negative for choking, shortness of breath and wheezing.     Dr. Johnsie Cancel note in April note stated.   Feels worse with multiple med changes made previously including increasing lopressor to 25 mg and changing Lisinopril/HCTZ to losartan. Will continue for now see below.  Pt tells me is no longer on metoprolol. Pulse was dropping. So he stopped.    Objective:   Physical Exam   General Mental Status- Alert. General Appearance- Not in acute distress.   Skin General: Color- Normal Color. Moisture- Normal Moisture.  Neck Carotid Arteries- Normal color. Moisture-  Normal Moisture. No carotid bruits. No JVD.  Chest and Lung Exam Auscultation: Breath Sounds:-Normal.  Cardiovascular Auscultation:Rythm- Regular. Murmurs & Other Heart Sounds:Auscultation of the heart reveals- No Murmurs.  Abdomen Inspection:-Inspeection Normal. Palpation/Percussion:Note:No mass. Palpation and Percussion of the abdomen reveal- Non Tender, Non Distended + BS, no rebound or guarding.    Neurologic Cranial Nerve exam:- CN III-XII intact(No nystagmus), symmetric smile. Drift Test:- No drift. Romberg Exam:- Negative.  Heal to Toe Gait exam:-Normal. Finger to Nose:- Normal/Intact Strength:- 5/5 equal and symmetric strength both upper and lower extremities.      Assessment & Plan:   Patient Instructions  Very high blood pressures recently with a TIA left presentation last week in the emergency department.  Reviewed recent emergency department evaluation as well.  Presently clinically stable with normal neurologic exam and very low level headache at best 1 out of 10.  Advising to continue losartan 100 mg daily and prescribing amlodipine 10 mg daily.  Continue 81 mg aspirin.  Will send message to both Dr. Radford Pax and Dr. Johnsie Cancel who her cardiologist with the knee in the past.  Will see if they agree with using both losartan and amlodipine.    If you have any cardiac or neurologic signs and symptoms recommend being reevaluated in the emergency department.  White  count elevation last week when in the emergency department.  Given antibiotics last week for pneumonia.  Will repeat WBC level today.  Include metabolic panel as well.  For diabetes continue metformin and Actos.  Recommend low sugar diet.  Follow-up in 1 to 2 weeks with Dr. Abbey Chatters or sooner if needed.   Time spent with patient today was  45 minutes which consisted of chart review, discussing diagnosis, work up, treatment and documentation.

## 2021-10-18 NOTE — Patient Instructions (Signed)
Very high blood pressures recently with a TIA left presentation last week in the emergency department.  Reviewed recent emergency department evaluation as well.  Presently clinically stable with normal neurologic exam and very low level headache at best 1 out of 10.  Advising to continue losartan 100 mg daily and prescribing amlodipine 10 mg daily.  Continue 81 mg aspirin.  Will send message to both Dr. Radford Pax and Dr. Johnsie Cancel who her cardiologist with the knee in the past.  Will see if they agree with using both losartan and amlodipine.    If you have any cardiac or neurologic signs and symptoms recommend being reevaluated in the emergency department.  White count elevation last week when in the emergency department.  Given antibiotics last week for pneumonia.  Will repeat WBC level today.  Include metabolic panel as well.  For diabetes continue metformin and Actos.  Recommend low sugar diet.  Follow-up in 1 to 2 weeks with Dr. Abbey Chatters or sooner if needed.

## 2021-10-18 NOTE — Telephone Encounter (Signed)
Dr. Radford Pax and Dr. Johnsie Cancel,  [facility]. Dr. Larose Kells was not in office and I saw  pt for  2 recent emergency dept evaluation. One week ago had TIA like presentation then later in week had moderate ha. Over past 2 ED visits bp was in 170-180/80 range. He reports at home systolic up to 579. Bp check today in office 168/80. Pt was on losartan 100 mg. I added amlodipine 10 mg daily today. Formerly he was on metoprolol but per his report had low pulses. Also was on lisionpril hct in past. I chose amlodipine today and  want to keep you both informed. Pt mentioned Dr. Radford Pax was primary cardiologist  but Dr. Johnsie Cancel last saw. Are you ok with amlodipine as I saw he was also on that in the past.   Thanks for your help. Mackie Pai, PA-C

## 2021-10-19 LAB — COMPREHENSIVE METABOLIC PANEL
ALT: 26 U/L (ref 0–53)
AST: 25 U/L (ref 0–37)
Albumin: 3.8 g/dL (ref 3.5–5.2)
Alkaline Phosphatase: 61 U/L (ref 39–117)
BUN: 21 mg/dL (ref 6–23)
CO2: 26 mEq/L (ref 19–32)
Calcium: 9 mg/dL (ref 8.4–10.5)
Chloride: 103 mEq/L (ref 96–112)
Creatinine, Ser: 1.11 mg/dL (ref 0.40–1.50)
GFR: 65.15 mL/min (ref >60.00)
Glucose, Bld: 248 mg/dL — ABNORMAL HIGH (ref 70–99)
Potassium: 3.8 mEq/L (ref 3.5–5.1)
Sodium: 140 mEq/L (ref 135–145)
Total Bilirubin: 0.4 mg/dL (ref 0.2–1.2)
Total Protein: 6.6 g/dL (ref 6.0–8.3)

## 2021-10-19 LAB — CBC WITH DIFFERENTIAL/PLATELET
Basophils Absolute: 0.1 10*3/uL (ref 0.0–0.1)
Basophils Relative: 0.9 % (ref 0.0–3.0)
Eosinophils Absolute: 0.6 10*3/uL (ref 0.0–0.7)
Eosinophils Relative: 8.3 % — ABNORMAL HIGH (ref 0.0–5.0)
HCT: 38.5 % — ABNORMAL LOW (ref 39.0–52.0)
Hemoglobin: 13 g/dL (ref 13.0–17.0)
Lymphocytes Relative: 16.3 % (ref 12.0–46.0)
Lymphs Abs: 1.2 10*3/uL (ref 0.7–4.0)
MCHC: 33.7 g/dL (ref 30.0–36.0)
MCV: 93.4 fl (ref 78.0–100.0)
Monocytes Absolute: 0.6 10*3/uL (ref 0.1–1.0)
Monocytes Relative: 8.1 % (ref 3.0–12.0)
Neutro Abs: 5 10*3/uL (ref 1.4–7.7)
Neutrophils Relative %: 66.4 % (ref 43.0–77.0)
Platelets: 274 10*3/uL (ref 150.0–400.0)
RBC: 4.12 Mil/uL — ABNORMAL LOW (ref 4.22–5.81)
RDW: 13.1 % (ref 11.5–15.5)
WBC: 7.6 10*3/uL (ref 4.0–10.5)

## 2021-11-01 ENCOUNTER — Ambulatory Visit (INDEPENDENT_AMBULATORY_CARE_PROVIDER_SITE_OTHER): Payer: Medicare Other | Admitting: Internal Medicine

## 2021-11-01 ENCOUNTER — Encounter: Payer: Self-pay | Admitting: Internal Medicine

## 2021-11-01 VITALS — BP 130/64 | HR 48 | Temp 98.0°F | Resp 16 | Ht 70.0 in | Wt 223.1 lb

## 2021-11-01 DIAGNOSIS — I10 Essential (primary) hypertension: Secondary | ICD-10-CM

## 2021-11-01 DIAGNOSIS — R0902 Hypoxemia: Secondary | ICD-10-CM | POA: Diagnosis not present

## 2021-11-01 DIAGNOSIS — R410 Disorientation, unspecified: Secondary | ICD-10-CM | POA: Diagnosis not present

## 2021-11-01 DIAGNOSIS — E1169 Type 2 diabetes mellitus with other specified complication: Secondary | ICD-10-CM | POA: Diagnosis not present

## 2021-11-01 DIAGNOSIS — I2510 Atherosclerotic heart disease of native coronary artery without angina pectoris: Secondary | ICD-10-CM | POA: Diagnosis not present

## 2021-11-01 MED ORDER — AMLODIPINE BESYLATE 10 MG PO TABS: 10.0000 mg | ORAL_TABLET | Freq: Every day | ORAL | 2 refills | Status: DC

## 2021-11-01 MED ORDER — METFORMIN HCL 1000 MG PO TABS
1000.0000 mg | ORAL_TABLET | Freq: Two times a day (BID) | ORAL | 1 refills | Status: DC
Start: 2021-11-01 — End: 2022-09-18

## 2021-11-01 NOTE — Progress Notes (Signed)
Subjective:    Patient ID: Carl Knapp, male    DOB: 28-Jul-1945, 76 y.o.   MRN: [id number]  DOS:  11/01/2021 Type of visit - description: Follow-up, here with his wife.  New patient first seen 09/21/2021. Since then, several things happened, chart is reviewed:  10/11/2021:  Martin Majestic to the ER with mental status changes, was confused for about 45 minutes. The ER consulted with neurology over the phone.  Work-up included the brain MRI, it was no acute. Respiratory panel negative.  Blood sugar 179.  Creatinine slightly up at 1.3.  CBC showed a white count of 16.8.   CT angio head and neck with no large vessel occlusion or high grade stenosis.  Portable chest x-ray no acute. O2 sat with episodic desaturations.  PNM?  Was Rx antibiotic  10/16/2021 Went to the ER again complaining of severe headache.  Had transient blurred vision. BP at home in the 220s. Upon arrival at the ER BP was better spontaneously. Labs including blood sugar of 223, CBC this time showed a normal white count. CT head negative, was Rx metoclopramide and diphenhydramine, felt better, was discharged home..  10/18/2021: Was seen here by another provider  CBC was satisfactory, CMP showed a sugar of 248 BP was 168/80, amlodipine was added.   Review of Systems Currently feeling well. No further mental status changes. He denies any dizziness, diplopia, slurred speech or motor deficits  Had low O2 sat at the ER few weeks ago however he denies chest pain or difficulty breathing. Has not checked his oxygen at home. Was suspected to have pneumonia but never had cough.  Finish antibiotics as prescribed by the ER.  DM: Ambulatory CBGs in the 130, 145.  HTN: Better since he started amlodipine.    Past Medical History:  Diagnosis Date   Allergy    Arthritis    Atrial tachycardia, paroxysmal (HCC)    Noted to have up to 14 beats in a row of atrial tachycardia on event monitor for/2023   Chronic kidney disease     Coronary artery disease 12/2013   20-30% RCA by cath- states was good.   Diabetes mellitus without complication ([place])    type II   Drug-induced myopathy    DVT (deep venous thrombosis) (HCC)    bilateral legs-greater on left. -3-5 years ago-tx. coumadin x 1 year.   Eczema    Erectile dysfunction    GERD (gastroesophageal reflux disease)    Hx of cardiovascular stress test    ETT-Myoview (7/15):  ECG with ST depression; freq PVCs, + chest pain; normal perfusion   Hyperlipidemia    LDL goal<100   Hypertension    Macular degeneration    mild   OSA (obstructive sleep apnea)    Severe w AHI 37/hr now on CPAP at 10cm H2O   PVC's (premature ventricular contractions)    Isolated PVCs, ventricular couplets and triplets, bigeminal and trigeminal PVCs with PVC load less than 1% on event monitor 08/2021    Past Surgical History:  Procedure Laterality Date   APPENDECTOMY     open '68   HERNIA REPAIR Bilateral 1980   inguinal   INSERTION OF MESH N/A 06/14/2017   Procedure: INSERTION OF MESH;  Surgeon: Alphonsa Overall, MD;  Location: [facility];  Service: General;  Laterality: N/A;   LEFT HEART CATHETERIZATION WITH CORONARY ANGIOGRAM N/A 12/09/2013   Procedure: LEFT HEART CATHETERIZATION WITH CORONARY ANGIOGRAM;  Surgeon: Jettie Booze, MD;  Location: [facility] CATH LAB;  Service: Cardiovascular;  Laterality: N/A;   TONSILLECTOMY     UMBILICAL HERNIA REPAIR N/A 06/14/2017   Procedure: LAPAROSCOPIC UMBILICAL HERNIA REPAIR WITH MESH ERAS PATHWAY;  Surgeon: Alphonsa Overall, MD;  Location: [facility];  Service: General;  Laterality: N/A;  ERAS PATHWAY   VASECTOMY      Current Outpatient Medications  Medication Instructions   acetaminophen (TYLENOL) 1,000 mg, Oral, Daily PRN   amLODipine (NORVASC) 10 mg, Oral, Daily   aspirin EC 81 mg, Daily   atorvastatin (LIPITOR) 40 mg, Oral, Daily   ketoconazole (NIZORAL) 2 % cream 1 application , Topical, 2 times daily PRN   loratadine (CLARITIN) 10 mg, Oral, Daily  PRN   losartan (COZAAR) 100 mg, Oral, Daily   metFORMIN (GLUCOPHAGE) 1,000 mg, Oral, 2 times daily with meals   methocarbamol (ROBAXIN) 500 mg, Oral, 2 times daily PRN   Multiple Vitamins-Minerals (PRESERVISION AREDS 2) CAPS 1 capsule, Oral, 2 times daily   multivitamin (ONE-A-DAY MEN'S) TABS tablet 1 tablet, Oral, Daily   Omega-3 Fatty Acids (FISH OIL) 1000 MG CAPS 1 capsule, Oral, 2 times daily   pantoprazole (PROTONIX) 40 mg, Oral, Daily before breakfast   pioglitazone (ACTOS) 30 mg, Oral, Daily   sildenafil (REVATIO) 40-60 mg, Oral, Daily PRN   traMADol (ULTRAM) 50 mg, Oral, 2 times daily PRN   triamcinolone cream (KENALOG) 0.1 % 1 application , Topical, 2 times daily PRN       Objective:   Physical Exam BP 130/64   Pulse (!) 48   Temp 98 F (36.7 C) (Oral)   Resp 16   Ht '5\' 10"'$  (1.778 m)   Wt 223 lb 2 oz (101.2 kg)   SpO2 96%   BMI 32.02 kg/m  General:   Well developed, NAD, BMI noted.  HEENT:  Normocephalic . Face symmetric, atraumatic Lungs:  CTA B Normal respiratory effort, no intercostal retractions, no accessory muscle use. Heart: RRR,  no murmur.  Abdomen:  Not distended, soft, non-tender. No rebound or rigidity.   Skin: Not pale. Not jaundice Lower extremities: no pretibial edema bilaterally  Neurologic:  alert & oriented X3.  Speech normal, gait appropriate for age and unassisted Psych--  Cognition and judgment appear intact.  Cooperative with normal attention span and concentration.  Behavior appropriate. No anxious or depressed appearing.     Assessment    Assessment  (new patient 09/2021, previous PCP retiring) DM dx ~ 2010 HTN High cholesterol CKD CAD GERD (remotely) Supraventricular tachycardia OSA, Cpap Macular degeneration H/o DVT  PLAN Mental status changes: Recently seen at the ER, see HPI for summary of the work-up.  No further symptoms.  Has an appointment pending with neurology for next month.  Recommend observation for now and  keep neurology follow-up Hypoxia: Had some hypoxia when he was seen at the ER, etiology unclear, question of pneumonia but did not have chest pain, cough or fever.  Was Rx ABX, O2 sat today is very good, recommend to check at home.  Call if less than 95%. DM: Last A1c a month ago was 7.0, recent CBGs have been elevated when she was evaluated at the ER however home CBGs run in the 130, 140.  Currently on metformin, Actos.  We agreed to continue checking ambulatory CBGs and reassess in 2 months. HTN: On losartan, recently amlodipine added.  BP now is very good.  Recommend to continue monitoring. Multiple refills sent. RTC 2 months.  Time spent: 35 minutes with the patient and his wife.  I  summarized for them what was done at the ER x2, we talk about his chronic medical problems, we debated about adjusting her diabetes medicine but we agreed not to.  Refills sent.

## 2021-11-02 ENCOUNTER — Ambulatory Visit: Payer: Medicare Other | Admitting: Internal Medicine

## 2021-11-02 NOTE — Assessment & Plan Note (Signed)
Mental status changes: Recently seen at the ER, see HPI for summary of the work-up.  No further symptoms.  Has an appointment pending with neurology for next month.  Recommend observation for now and keep neurology follow-up Hypoxia: Had some hypoxia when he was seen at the ER, etiology unclear, question of pneumonia but did not have chest pain, cough or fever.  Was Rx ABX, O2 sat today is very good, recommend to check at home.  Call if less than 95%. DM: Last A1c a month ago was 7.0, recent CBGs have been elevated when she was evaluated at the ER however home CBGs run in the 130, 140.  Currently on metformin, Actos.  We agreed to continue checking ambulatory CBGs and reassess in 2 months. HTN: On losartan, recently amlodipine added.  BP now is very good.  Recommend to continue monitoring. Multiple refills sent. RTC 2 months.

## 2021-11-14 ENCOUNTER — Ambulatory Visit (INDEPENDENT_AMBULATORY_CARE_PROVIDER_SITE_OTHER): Payer: Medicare Other | Admitting: Pharmacist

## 2021-11-14 VITALS — BP 158/62 | HR 68

## 2021-11-14 DIAGNOSIS — I2510 Atherosclerotic heart disease of native coronary artery without angina pectoris: Secondary | ICD-10-CM | POA: Diagnosis not present

## 2021-11-14 DIAGNOSIS — E7800 Pure hypercholesterolemia, unspecified: Secondary | ICD-10-CM

## 2021-11-14 DIAGNOSIS — I10 Essential (primary) hypertension: Secondary | ICD-10-CM | POA: Diagnosis not present

## 2021-11-14 NOTE — Patient Instructions (Addendum)
Your LDL cholesterol was 80 most recently, your goal is < 64 Dr Larose Kells can recheck this in August. If your LDL remains above 70, would recommend either increasing atorvastatin to '80mg'$  daily or replacing it with rosuvastatin '40mg'$  daily. Adding on ezetimibe is a back up option as well if LDL remains above 70 after the statin change.  Continue to monitor your blood pressure. Your goal is < 130/51mHg. If your readings stay consistently elevated above this, would recommend changing your losartan to losartan-HCTZ combo product (you took a similar medication in the past, the lisinopril-HCTZ)

## 2021-11-14 NOTE — Progress Notes (Signed)
Patient ID: Carl Knapp                 DOB: 27-Nov-1945                    MRN: [id number]     HPI: Carl Knapp is a 76 y.o. male patient referred to lipid clinic by Dr Radford Pax. PMH is significant for severe OSA on CPAP, DM, HTN, and CAD with coronary CTA showing calcium score of 0 but 25-49% noncalcified plaque in the mid RCA and mid LAD with medical management recommended. CT FFR showed possible flow limiting lesion in the mid to distal LAD but felt likely related to distal tapering of the vessel and not from hemodynamically flow limiting lesion. PCP previously decreased atorvastatin from '40mg'$  to '20mg'$  daily due to leg pain. Pt did not notice improvement in symptoms and was referred to lipid clinic.  Pt presents today for follow up. Reports he increased his atorvastatin back to '40mg'$  daily 3-4 weeks ago. Previously took simvastatin in the past, weaned off before changing to atorvastatin, never noticed any change in his leg pain which has been ongoing for about a year. Has arthritis in his back and believes the pain is coming from that. Scheduled for a cortisone injection in his back next week. Also seeing neuro soon due to questionable TIA.   Has also been dealing with elevated BP readings recently. Typically would run 130/60s but had increased up to systolic 616 recently and was in the ER a few times. Currently taking and tolerating amlodipine '10mg'$  daily and losartan '100mg'$  daily. Previously took lisinopril-HCTZ in the past, had no issues tolerating it. Has had more stress lately - in the process of moving, stepson passed away recently, work-related stress as well which may be contributing to recent elevation in BP. Has previously brought home BP cuff to PCP office and reports it measured accurately. Checks his readings using appropriate technique at home. Took his BP meds this AM about 2 hours ago.  Current Medications: atorvastatin '40mg'$  daily Intolerances: simvastatin '20mg'$  daily, atorvastatin 20-'40mg'$   daily - leg pain Risk Factors: CAD, DM, HTN LDL goal: '70mg'$ /dL  Family History: AAA (abdominal aortic aneurysm) in his father; CAD in his father; Heart attack in his father; Hypertension in an other family member.  Social History: Former tobacco use 0.5 PPD for 20 years, quit in 2012, occasional alcohol, no drug use.  Labs: 10/10/21: TC 138, TG 92, HDL 41, LDL 80 (atorvastatin '20mg'$  daily) 08/18/21: TC 135, TG 84, HDL 43, LDL 76 (atorvastatin '20mg'$  daily)  Past Medical History:  Diagnosis Date   Allergy    Arthritis    Atrial tachycardia, paroxysmal (HCC)    Noted to have up to 14 beats in a row of atrial tachycardia on event monitor for/2023   Chronic kidney disease    Coronary artery disease 12/2013   20-30% RCA by cath- states was good.   Diabetes mellitus without complication ([place])    type II   Drug-induced myopathy    DVT (deep venous thrombosis) (HCC)    bilateral legs-greater on left. -3-5 years ago-tx. coumadin x 1 year.   Eczema    Erectile dysfunction    GERD (gastroesophageal reflux disease)    Hx of cardiovascular stress test    ETT-Myoview (7/15):  ECG with ST depression; freq PVCs, + chest pain; normal perfusion   Hyperlipidemia    LDL goal<100   Hypertension    Macular degeneration  mild   OSA (obstructive sleep apnea)    Severe w AHI 37/hr now on CPAP at 10cm H2O   PVC's (premature ventricular contractions)    Isolated PVCs, ventricular couplets and triplets, bigeminal and trigeminal PVCs with PVC load less than 1% on event monitor 08/2021    Current Outpatient Medications on File Prior to Visit  Medication Sig Dispense Refill   acetaminophen (TYLENOL) 500 MG tablet Take 1,000 mg by mouth daily as needed for moderate pain or headache.     amLODipine (NORVASC) 10 MG tablet Take 1 tablet (10 mg total) by mouth daily. 90 tablet 2   aspirin EC 81 MG tablet Take 81 mg by mouth daily.     atorvastatin (LIPITOR) 40 MG tablet Take 1 tablet (40 mg total) by mouth  daily. 90 tablet 3   ketoconazole (NIZORAL) 2 % cream Apply 1 application. topically 2 (two) times daily as needed for irritation (eczema).     loratadine (CLARITIN) 10 MG tablet Take 10 mg by mouth daily as needed for allergies.     losartan (COZAAR) 100 MG tablet Take 1 tablet (100 mg total) by mouth daily. 90 tablet 3   metFORMIN (GLUCOPHAGE) 1000 MG tablet Take 1 tablet (1,000 mg total) by mouth 2 (two) times daily with a meal. 180 tablet 1   methocarbamol (ROBAXIN) 500 MG tablet Take 1 tablet (500 mg total) by mouth 2 (two) times daily as needed for muscle spasms. 20 tablet 2   Multiple Vitamins-Minerals (PRESERVISION AREDS 2) CAPS Take 1 capsule by mouth in the morning and at bedtime.     multivitamin (ONE-A-DAY MEN'S) TABS tablet Take 1 tablet by mouth daily.     Omega-3 Fatty Acids (FISH OIL) 1000 MG CAPS Take 1 capsule by mouth in the morning and at bedtime.     pantoprazole (PROTONIX) 40 MG tablet Take 1 tablet (40 mg total) by mouth daily before breakfast. 90 tablet 1   pioglitazone (ACTOS) 30 MG tablet Take 1 tablet (30 mg total) by mouth daily. 90 tablet 1   sildenafil (REVATIO) 20 MG tablet Take 40-60 mg by mouth daily as needed (erectile dysfunction).     traMADol (ULTRAM) 50 MG tablet Take 1 tablet (50 mg total) by mouth 2 (two) times daily as needed. (Patient not taking: Reported on 11/01/2021) 30 tablet 2   triamcinolone cream (KENALOG) 0.1 % Apply 1 application. topically 2 (two) times daily as needed (eczema).     No current facility-administered medications on file prior to visit.    Allergies  Allergen Reactions   Ozempic (0.25 Or 0.5 Mg-Dose) [Semaglutide(0.25 Or 0.'5mg'$ -Dos)]     Was RX 2018 in the context of a research study. Caused irreg heart beat, stomach issues. No rash-itching-tongue swelling    Simvastatin     Other reaction(s): myalgias    Assessment/Plan:  1. Hyperlipidemia - LDL most recently 80 above goal < 70 due to nonobstructive CAD and DM. Labs  reflect pt on atorvastatin '20mg'$  daily, he has since increased back to '40mg'$  daily 3-4 weeks ago and attributes his leg pain to his arthritis rather than statins as pain has been unchanged despite change in statin therapy over the past year. Seeing ortho for steroid injection in his back next week. Pt prefers to have labs rechecked on atorvastatin '40mg'$  daily before making med changes. Not fasting today, prefers for his PCP to recheck labs when he sees him in August. Prefers for PCP to adjust meds, provided him with possible recs  today if future LDL remains elevated, including increasing atorvastatin to '80mg'$  daily or changing to rosuvastatin '40mg'$  daily. Ezetimibe could be added on in the future if needed as well.  2. HTN - BP elevated in office today above goal <007/[id number] with systolic in the 354T, consistent with his home readings over the past few days. Recently well controlled at PCP office on 6/27 but elevated there on 6/13. Pt tolerating amlodipine '10mg'$  daily and losartan '100mg'$  daily. Discussed changing losartan to losartan-HCTZ combo pill, BMET is stable (he previously took lisinopril-HCTZ in the past which he tolerated well). He prefers for PCP to manage his BP as well. Advised him to continue monitoring his readings at home and contact PCP if readings remain consistently elevated above goal.  Edit Ricciardelli E. Solenne Manwarren, PharmD, BCACP, [facility] [address] [place], [place] [address] Phone: [phone]; Fax: [phone] 11/14/2021 10:12 AM

## 2021-11-23 ENCOUNTER — Encounter: Payer: Self-pay | Admitting: Physical Medicine and Rehabilitation

## 2021-11-23 ENCOUNTER — Ambulatory Visit: Payer: Self-pay

## 2021-11-23 ENCOUNTER — Ambulatory Visit (INDEPENDENT_AMBULATORY_CARE_PROVIDER_SITE_OTHER): Payer: Medicare Other | Admitting: Physical Medicine and Rehabilitation

## 2021-11-23 DIAGNOSIS — M5416 Radiculopathy, lumbar region: Secondary | ICD-10-CM | POA: Diagnosis not present

## 2021-11-23 MED ORDER — METHYLPREDNISOLONE ACETATE 80 MG/ML IJ SUSP
80.0000 mg | Freq: Once | INTRAMUSCULAR | Status: AC
  Administered 2021-11-23: 80 mg

## 2021-11-23 NOTE — Progress Notes (Signed)
Pt state lower back that travels down both legs, mostly his right leg. Pt state sitting and standing makes the pain worse. Pt state he uses heat and ice and takes over the counter pain meds to help ease his pain.  Numeric Pain Rating Scale and Functional Assessment Average Pain 2   In the last MONTH (on 0-10 scale) has pain interfered with the following?  1. General activity like being  able to carry out your everyday physical activities such as walking, climbing stairs, carrying groceries, or moving a chair?  Rating(8)   +Driver, -BT, -Dye Allergies.

## 2021-11-23 NOTE — Patient Instructions (Signed)

## 2021-11-24 NOTE — Progress Notes (Signed)
Referring:  Larene Pickett, PA-C [place],  [place] [address]  PCP: Colon Branch, MD  Neurology was asked to evaluate Carl Knapp, a 76 year old male for a chief complaint of headaches and confusion.  Our recommendations of care will be communicated by shared medical record.    CC:  headaches and confusion  History provided from self  HPI:  Medical co-morbidities: HTN, CAD, OSA, GERD, HLD, DM2, history of DVT, macular degeneration, ?TIA  The patient presents for evaluation of headaches and ?TIA.  On 10/11/21 he presented to the ED with confusion and ataxia. States he could understand what people were saying to him but was not processing information well. He could not say his wife's name. Also had a severe headache with photophobia and nausea which lasted ~12 hours. No weakness or numbness. Presented to the ED where BP was found to be elevated. He was also found to be borderline hypoxic. CTA head/neck and MRI were unremarkable. Had a TTE in March which showed normal LV function with mild left atrial enlargement. Recent cardiac monitor showed PVCs and atrial tachycardia without afib. LDL 80, A1c 7.0. He is taking ASA 81 mg daily and lipitor 40 mg daily. He was recommended to increase this to 80 mg daily, but wanted to run this by his PCP before changing it.   Presented to the ED again 10/16/21. Had a severe headache at the time which was described as bitemporal pain with associated photophobia and nausea. He did see flashing lights in his vision as well. Did not have any confusion this time. SBP was 220. CTH was unremarkable. He was started on amlodipine a couple of days later, and BP has been well-controlled since then without recurrence of headaches or confusion.  He does have a history of severe headaches several years ago. He was started on lisinopril-HCTZ which had resolved his headaches until recently.   Headache days per month: 2 Headache free days per month:  28  Current Treatment: Abortive none  Preventative none  Prior Therapies                                 Losartan lisinopril Metoprolol - fatigue  LABS: CBC    Component Value Date/Time   WBC 7.6 10/18/2021 1529   RBC 4.12 (L) 10/18/2021 1529   HGB 13.0 10/18/2021 1529   HCT 38.5 (L) 10/18/2021 1529   PLT 274.0 10/18/2021 1529   MCV 93.4 10/18/2021 1529   MCH 31.7 10/16/2021 2112   MCHC 33.7 10/18/2021 1529   RDW 13.1 10/18/2021 1529   LYMPHSABS 1.2 10/18/2021 1529   MONOABS 0.6 10/18/2021 1529   EOSABS 0.6 10/18/2021 1529   BASOSABS 0.1 10/18/2021 1529      Latest Ref Rng & Units 10/18/2021    3:29 PM 10/16/2021    9:38 PM 10/16/2021    9:12 PM  CMP  Glucose 70 - 99 mg/dL 248  222  223   BUN 6 - 23 mg/dL '21  22  19   '$ Creatinine 0.40 - 1.50 mg/dL 1.11  1.20  1.18   Sodium 135 - 145 mEq/L 140  141  139   Potassium 3.5 - 5.1 mEq/L 3.8  3.5  3.5   Chloride 96 - 112 mEq/L 103  104  105   CO2 19 - 32 mEq/L 26   23   Calcium 8.4 - 10.5 mg/dL 9.0  8.6   Total Protein 6.0 - 8.3 g/dL 6.6   6.7   Total Bilirubin 0.2 - 1.2 mg/dL 0.4   0.6   Alkaline Phos 39 - 117 U/L 61   55   AST 0 - 37 U/L 25   31   ALT 0 - 53 U/L 26   29    LDL 80, A1c 7.0  IMAGING:  CTH 10/16/21:  1. No acute intracranial pathology. 2. Mild age-related atrophy and chronic microvascular ischemic changes.  MRI brain 10/12/21: Mild chronic small vessel ischemia.   Imaging independently reviewed on November 28, 2021   Current Outpatient Medications on File Prior to Visit  Medication Sig Dispense Refill   acetaminophen (TYLENOL) 500 MG tablet Take 1,000 mg by mouth daily as needed for moderate pain or headache.     amLODipine (NORVASC) 10 MG tablet Take 1 tablet (10 mg total) by mouth daily. 90 tablet 2   aspirin EC 81 MG tablet Take 81 mg by mouth daily.     atorvastatin (LIPITOR) 40 MG tablet Take 1 tablet (40 mg total) by mouth daily. 90 tablet 3   ketoconazole (NIZORAL) 2 % cream Apply 1  application. topically 2 (two) times daily as needed for irritation (eczema).     loratadine (CLARITIN) 10 MG tablet Take 10 mg by mouth daily as needed for allergies.     losartan (COZAAR) 100 MG tablet Take 1 tablet (100 mg total) by mouth daily. 90 tablet 3   metFORMIN (GLUCOPHAGE) 1000 MG tablet Take 1 tablet (1,000 mg total) by mouth 2 (two) times daily with a meal. 180 tablet 1   methocarbamol (ROBAXIN) 500 MG tablet Take 1 tablet (500 mg total) by mouth 2 (two) times daily as needed for muscle spasms. 20 tablet 2   Multiple Vitamins-Minerals (PRESERVISION AREDS 2) CAPS Take 1 capsule by mouth in the morning and at bedtime.     multivitamin (ONE-A-DAY MEN'S) TABS tablet Take 1 tablet by mouth daily.     Omega-3 Fatty Acids (FISH OIL) 1000 MG CAPS Take 1 capsule by mouth in the morning and at bedtime.     pantoprazole (PROTONIX) 40 MG tablet Take 1 tablet (40 mg total) by mouth daily before breakfast. 90 tablet 1   pioglitazone (ACTOS) 30 MG tablet Take 1 tablet (30 mg total) by mouth daily. 90 tablet 1   sildenafil (REVATIO) 20 MG tablet Take 100 mg by mouth daily as needed (erectile dysfunction).     traMADol (ULTRAM) 50 MG tablet Take 1 tablet (50 mg total) by mouth 2 (two) times daily as needed. 30 tablet 2   triamcinolone cream (KENALOG) 0.1 % Apply 1 application. topically 2 (two) times daily as needed (eczema).     Current Facility-Administered Medications on File Prior to Visit  Medication Dose Route Frequency Provider Last Rate Last Admin   methylPREDNISolone acetate (DEPO-MEDROL) injection 80 mg  80 mg Other Once Magnus Sinning, MD         Allergies: Allergies  Allergen Reactions   Ozempic (0.25 Or 0.5 Mg-Dose) [Semaglutide(0.25 Or 0.'5mg'$ -Dos)]     Was RX 2018 in the context of a research study. Caused irreg heart beat, stomach issues. No rash-itching-tongue swelling    Simvastatin     Other reaction(s): myalgias    Family History: Migraine or other headaches in the  family:  no Aneurysms in a first degree relative:  no Brain tumors in the family:  no Other neurological illness in the family:  no  Past Medical History: Past Medical History:  Diagnosis Date   Allergy    Arthritis    Atrial tachycardia, paroxysmal (HCC)    Noted to have up to 14 beats in a row of atrial tachycardia on event monitor for/2023   Chronic kidney disease    Coronary artery disease 12/2013   20-30% RCA by cath- states was good.   Diabetes mellitus without complication ([place])    type II   Drug-induced myopathy    DVT (deep venous thrombosis) (HCC)    bilateral legs-greater on left. -3-5 years ago-tx. coumadin x 1 year.   Eczema    Erectile dysfunction    GERD (gastroesophageal reflux disease)    Hx of cardiovascular stress test    ETT-Myoview (7/15):  ECG with ST depression; freq PVCs, + chest pain; normal perfusion   Hyperlipidemia    LDL goal<100   Hypertension    Macular degeneration    mild   OSA (obstructive sleep apnea)    Severe w AHI 37/hr now on CPAP at 10cm H2O   PVC's (premature ventricular contractions)    Isolated PVCs, ventricular couplets and triplets, bigeminal and trigeminal PVCs with PVC load less than 1% on event monitor 08/2021    Past Surgical History Past Surgical History:  Procedure Laterality Date   APPENDECTOMY     open '68   HERNIA REPAIR Bilateral 1980   inguinal   INSERTION OF MESH N/A 06/14/2017   Procedure: INSERTION OF MESH;  Surgeon: Alphonsa Overall, MD;  Location: [facility];  Service: General;  Laterality: N/A;   LEFT HEART CATHETERIZATION WITH CORONARY ANGIOGRAM N/A 12/09/2013   Procedure: LEFT HEART CATHETERIZATION WITH CORONARY ANGIOGRAM;  Surgeon: Jettie Booze, MD;  Location: [facility] CATH LAB;  Service: Cardiovascular;  Laterality: N/A;   TONSILLECTOMY     UMBILICAL HERNIA REPAIR N/A 06/14/2017   Procedure: LAPAROSCOPIC UMBILICAL HERNIA REPAIR WITH MESH ERAS PATHWAY;  Surgeon: Alphonsa Overall, MD;  Location: [facility];  Service:  General;  Laterality: N/A;  ERAS PATHWAY   VASECTOMY      Social History: Social History   Tobacco Use   Smoking status: Former    Packs/day: 0.50    Years: 20.00    Total pack years: 10.00    Types: Cigarettes    Quit date: 06/08/2010    Years since quitting: 11.4   Smokeless tobacco: Never   Tobacco comments:    < 1/2   Vaping Use   Vaping Use: Never used  Substance Use Topics   Alcohol use: Yes    Alcohol/week: 1.0 - 3.0 standard drink of alcohol    Types: 1 Glasses of wine per week    Comment: rare beer or glass of wine   Drug use: No    ROS: Negative for fevers, chills. Positive for headaches, confusion. All other systems reviewed and negative unless stated otherwise in HPI.   Physical Exam:   Vital Signs: BP (!) 155/76   Pulse 70   Ht '5\' 10"'$  (1.778 m)   Wt 221 lb 4 oz (100.4 kg)   BMI 31.75 kg/m  GENERAL: well appearing,in no acute distress,alert SKIN:  Color, texture, turgor normal. No rashes or lesions HEAD:  Normocephalic/atraumatic. CV:  RRR RESP: Normal respiratory effort MSK: no tenderness to palpation over occiput, neck, or shoulders  NEUROLOGICAL: Mental Status: Alert, oriented to person, place and time,Follows commands Cranial Nerves: PERRL, visual fields intact to confrontation, extraocular movements intact, facial sensation intact, no facial droop or  ptosis, hearing grossly intact, no dysarthria Motor: muscle strength 5/5 both upper and lower extremities,no drift, normal tone Reflexes: 2+ throughout Sensation: intact to light touch all 4 extremities Coordination: Finger-to- nose-finger intact bilaterally Gait: normal-based   IMPRESSION: 76 year old male with a history of HTN, CAD, OSA, GERD, HLD, DM2, history of DVT, macular degeneration, ?TIA who presents for episodes of headaches and confusion associated with high blood pressure. MRI and CTA head/neck were unremarkable. Unclear if this episode represented a TIA vs hypertensive encephalopathy  or migraine as symptoms did improve with migraine cocktail/BP reduction. His headache during the episode is consistent with migraine with aura, and he reports a history of severe headaches triggered by high blood pressure. Has not had any episodes since addition of amlodipine to his regimen. Agree with increasing atorvastatin to 80 mg to optimize his vascular risk factors. Offered rescue medication for migraines which he declined at this time.  PLAN: -Recommend increasing atorvastatin to 80 mg daily -Continue ASA 81 mg daily -consider PRN gepant for migraine rescue if headaches return  I spent a total of 42 minutes chart reviewing and counseling the patient. Discussed medication side effects, adverse reactions and drug interactions. Written educational materials and patient instructions outlining all of the above were given.  Follow-up: 1 year or sooner if needed   Genia Harold, MD 11/28/2021   10:58 AM

## 2021-11-28 ENCOUNTER — Ambulatory Visit (INDEPENDENT_AMBULATORY_CARE_PROVIDER_SITE_OTHER): Payer: Medicare Other | Admitting: Psychiatry

## 2021-11-28 ENCOUNTER — Encounter: Payer: Self-pay | Admitting: Psychiatry

## 2021-11-28 VITALS — BP 155/76 | HR 70 | Ht 70.0 in | Wt 221.2 lb

## 2021-11-28 DIAGNOSIS — G43109 Migraine with aura, not intractable, without status migrainosus: Secondary | ICD-10-CM | POA: Diagnosis not present

## 2021-11-28 DIAGNOSIS — G459 Transient cerebral ischemic attack, unspecified: Secondary | ICD-10-CM | POA: Diagnosis not present

## 2021-11-28 NOTE — Patient Instructions (Signed)
Recommend increasing lipitor to 80 mg daily for stroke prevention

## 2021-12-05 NOTE — Progress Notes (Signed)
Carl Knapp - 76 y.o. male MRN [id number]  Date of birth: [birth year]  Office Visit Note: Visit Date: 11/23/2021 PCP: Colon Branch, MD Referred by: Colon Branch, MD  Subjective: Chief Complaint  Patient presents with   Lower Back - Pain   Right Leg - Pain   Left Leg - Pain   HPI:  Carl Knapp is a 76 y.o. male who comes in today at the request of Tawanna Cooler, PA-C for planned Right L5-S1 Lumbar Interlaminar epidural steroid injection with fluoroscopic guidance.  The patient has failed conservative care including home exercise, medications, time and activity modification.  This injection will be diagnostic and hopefully therapeutic.  Please see requesting physician notes for further details and justification.   ROS Otherwise per HPI.  Assessment & Plan: Visit Diagnoses:    ICD-10-CM   1. Lumbar radiculopathy  M54.16 XR C-ARM NO REPORT    Epidural Steroid injection    methylPREDNISolone acetate (DEPO-MEDROL) injection 80 mg      Plan: No additional findings.   Meds & Orders:  Meds ordered this encounter  Medications   methylPREDNISolone acetate (DEPO-MEDROL) injection 80 mg    Orders Placed This Encounter  Procedures   XR C-ARM NO REPORT   Epidural Steroid injection    Follow-up: Return for visit to requesting provider as needed.   Procedures: No procedures performed  Lumbar Epidural Steroid Injection - Interlaminar Approach with Fluoroscopic Guidance  Patient: Carl Knapp      Date of Birth: 1945/12/24 MRN: [id number] PCP: Colon Branch, MD      Visit Date: 11/23/2021   Universal Protocol:     Consent Given By: the patient  Position: PRONE  Additional Comments: Vital signs were monitored before and after the procedure. Patient was prepped and draped in the usual sterile fashion. The correct patient, procedure, and site was verified.   Injection Procedure Details:   Procedure diagnoses: Lumbar radiculopathy [M54.16]   Meds Administered:  Meds  ordered this encounter  Medications   methylPREDNISolone acetate (DEPO-MEDROL) injection 80 mg     Laterality: Right  Location/Site:  L5-S1  Needle: 3.5 in., 20 ga. Tuohy  Needle Placement: Paramedian epidural  Findings:   -Comments: Excellent flow of contrast into the epidural space.  Procedure Details: Using a paramedian approach from the side mentioned above, the region overlying the inferior lamina was localized under fluoroscopic visualization and the soft tissues overlying this structure were infiltrated with 4 ml. of 1% Lidocaine without Epinephrine. The Tuohy needle was inserted into the epidural space using a paramedian approach.   The epidural space was localized using loss of resistance along with counter oblique bi-planar fluoroscopic views.  After negative aspirate for air, blood, and CSF, a 2 ml. volume of Isovue-250 was injected into the epidural space and the flow of contrast was observed. Radiographs were obtained for documentation purposes.    The injectate was administered into the level noted above.   Additional Comments:  The patient tolerated the procedure well Dressing: 2 x 2 sterile gauze and Band-Aid    Post-procedure details: Patient was observed during the procedure. Post-procedure instructions were reviewed.  Patient left the clinic in stable condition.   Clinical History: MRI LUMBAR SPINE WITHOUT CONTRAST   TECHNIQUE: Multiplanar, multisequence MR imaging of the lumbar spine was performed. No intravenous contrast was administered.   COMPARISON:  Lumbar spine radiograph 09/27/2021   FINDINGS: Segmentation:  Standard.   Alignment:  Physiologic.   Vertebrae: No  fracture, evidence of discitis, or aggressive bone lesion.   Conus medullaris and cauda equina: Conus extends to the L1-L2 level. Conus and cauda equina appear normal.   Paraspinal and other soft tissues: Negative   Disc levels:   T12-L1: No significant spinal canal or neural  foraminal narrowing.   L1-L2: No significant spinal canal or neural foraminal narrowing.   L2-L3: Mild disc bulging, ligament flavum hypertrophy mild facet arthropathy results in mild spinal canal and bilateral subarticular stenosis. No significant neural foraminal stenosis.   L3-L4: Mild disc bulging, ligament flavum hypertrophy mild facet arthropathy. Mild spinal canal and right greater than left subarticular stenosis, encroaching the descending right L4 nerve root. No significant neural foraminal stenosis.   L4-L5: Broad-based disc bulging, ligament flavum hypertrophy and mild facet arthropathy results in moderate spinal canal stenosis with high-grade subarticular stenosis bilaterally potentially impinging the descending L5 nerve roots. Mild bilateral neural foraminal stenosis.   L5-S1: Minimal disc bulging. There is advanced bilateral facet arthropathy. No spinal canal stenosis. There is mild left-sided neural foraminal stenosis. No right neural foraminal stenosis.   IMPRESSION: Multilevel degenerative changes of the lumbar spine, worst at L4-L5, where there is spinal canal stenosis with high-grade subarticular stenosis bilaterally, potentially impinging the descending L5 nerve roots. Mild bilateral neural foraminal stenosis at this level.   Mild spinal canal and bilateral subarticular stenosis at L2-L3.   Mild spinal canal and right greater than left subarticular stenosis at L3-L4, encroaching the descending right L4 nerve root.   Mild left-sided neural foraminal stenosis at L5-S1.     Electronically Signed   By: Maurine Simmering M.D.   On: 10/10/2021 14:31     Objective:  VS:  HT:    WT:   BMI:     BP:   HR: bpm  TEMP: ( )  RESP:  Physical Exam Vitals and nursing note reviewed.  Constitutional:      General: He is not in acute distress.    Appearance: Normal appearance. He is not ill-appearing.  HENT:     Head: Normocephalic and atraumatic.     Right Ear:  External ear normal.     Left Ear: External ear normal.     Nose: No congestion.  Eyes:     Extraocular Movements: Extraocular movements intact.  Cardiovascular:     Rate and Rhythm: Normal rate.     Pulses: Normal pulses.  Pulmonary:     Effort: Pulmonary effort is normal. No respiratory distress.  Abdominal:     General: There is no distension.     Palpations: Abdomen is soft.  Musculoskeletal:        General: No tenderness or signs of injury.     Cervical back: Neck supple.     Right lower leg: No edema.     Left lower leg: No edema.     Comments: Patient has good distal strength without clonus.  Skin:    Findings: No erythema or rash.  Neurological:     General: No focal deficit present.     Mental Status: He is alert and oriented to person, place, and time.     Sensory: No sensory deficit.     Motor: No weakness or abnormal muscle tone.     Coordination: Coordination normal.  Psychiatric:        Mood and Affect: Mood normal.        Behavior: Behavior normal.      Imaging: No results found.

## 2021-12-05 NOTE — Procedures (Signed)
Lumbar Epidural Steroid Injection - Interlaminar Approach with Fluoroscopic Guidance  Patient: Carl Knapp      Date of Birth: Nov 23, 1945 MRN: [id number] PCP: Colon Branch, MD      Visit Date: 11/23/2021   Universal Protocol:     Consent Given By: the patient  Position: PRONE  Additional Comments: Vital signs were monitored before and after the procedure. Patient was prepped and draped in the usual sterile fashion. The correct patient, procedure, and site was verified.   Injection Procedure Details:   Procedure diagnoses: Lumbar radiculopathy [M54.16]   Meds Administered:  Meds ordered this encounter  Medications   methylPREDNISolone acetate (DEPO-MEDROL) injection 80 mg     Laterality: Right  Location/Site:  L5-S1  Needle: 3.5 in., 20 ga. Tuohy  Needle Placement: Paramedian epidural  Findings:   -Comments: Excellent flow of contrast into the epidural space.  Procedure Details: Using a paramedian approach from the side mentioned above, the region overlying the inferior lamina was localized under fluoroscopic visualization and the soft tissues overlying this structure were infiltrated with 4 ml. of 1% Lidocaine without Epinephrine. The Tuohy needle was inserted into the epidural space using a paramedian approach.   The epidural space was localized using loss of resistance along with counter oblique bi-planar fluoroscopic views.  After negative aspirate for air, blood, and CSF, a 2 ml. volume of Isovue-250 was injected into the epidural space and the flow of contrast was observed. Radiographs were obtained for documentation purposes.    The injectate was administered into the level noted above.   Additional Comments:  The patient tolerated the procedure well Dressing: 2 x 2 sterile gauze and Band-Aid    Post-procedure details: Patient was observed during the procedure. Post-procedure instructions were reviewed.  Patient left the clinic in stable condition.

## 2021-12-14 ENCOUNTER — Telehealth: Payer: Self-pay

## 2021-12-14 ENCOUNTER — Encounter: Payer: Self-pay | Admitting: Physical Medicine and Rehabilitation

## 2021-12-14 ENCOUNTER — Ambulatory Visit (INDEPENDENT_AMBULATORY_CARE_PROVIDER_SITE_OTHER): Payer: Medicare Other | Admitting: Physical Medicine and Rehabilitation

## 2021-12-14 VITALS — BP 149/65 | HR 73

## 2021-12-14 DIAGNOSIS — M4726 Other spondylosis with radiculopathy, lumbar region: Secondary | ICD-10-CM

## 2021-12-14 DIAGNOSIS — M5441 Lumbago with sciatica, right side: Secondary | ICD-10-CM | POA: Diagnosis not present

## 2021-12-14 DIAGNOSIS — M5416 Radiculopathy, lumbar region: Secondary | ICD-10-CM

## 2021-12-14 DIAGNOSIS — G8929 Other chronic pain: Secondary | ICD-10-CM

## 2021-12-14 DIAGNOSIS — I2510 Atherosclerotic heart disease of native coronary artery without angina pectoris: Secondary | ICD-10-CM

## 2021-12-14 DIAGNOSIS — M25561 Pain in right knee: Secondary | ICD-10-CM | POA: Diagnosis not present

## 2021-12-14 NOTE — Telephone Encounter (Signed)
Patient called stating that he is in a lot of pain with right knee and would like to know what can be done until his appointment on Friday, 12/16/2021.  Stated that he had some Tramadol, but it is not helping at all.  CB# [phone].  Please advise.  Thank you.

## 2021-12-14 NOTE — Progress Notes (Signed)
Pt has hx of inj on 11/23/21 right L5-S1 [place], pt state it helped. Pt state he has pain in his right knee and leg. Pt state walking and standing makes the pain worse.  Numeric Pain Rating Scale and Functional Assessment Average Pain 10 Pain Right Now 8 My pain is constant, sharp, and aching Pain is worse with: walking, sitting, standing, and some activites Pain improves with: heat/ice, medication, and injections   In the last MONTH (on 0-10 scale) has pain interfered with the following?  1. General activity like being  able to carry out your everyday physical activities such as walking, climbing stairs, carrying groceries, or moving a chair?  Rating(6)  2. Relation with others like being able to carry out your usual social activities and roles such as  activities at home, at work and in your community. Rating(7)  3. Enjoyment of life such that you have  been bothered by emotional problems such as feeling anxious, depressed or irritable?  Rating(7)

## 2021-12-14 NOTE — Progress Notes (Signed)
Carl Knapp - 76 y.o. male MRN [id number]  Date of birth: October 10, 1945  Office Visit Note: Visit Date: 12/14/2021 PCP: Colon Branch, MD Referred by: Colon Branch, MD  Subjective: Chief Complaint  Patient presents with   Right Leg - Pain   Right Knee - Pain   HPI: Carl Knapp is a 76 y.o. male who comes in today for evaluation of chronic right sided lower back pain radiating down right lateral leg to foot. Pain ongoing for several months and is exacerbated by movement and activity. He describes pain as a sore, sharp and shooting sensation, currently rates as 1 out of 10. Patient reports some relief of pain with home exercise regimen, rest and use of Tylenol. Patient also reports he attends water therapy and exercise classes at the [facility] multiple times a week. Also reports history of formal physical therapy with Museum/gallery exhibitions officer at [facility], some relief of pain with these treatments. Patients recent lumbar MRI imaging exhibits multilevel degenerative changes, most severe at L4-L5 where there is moderate spinal canal stenosis with high-grade subarticular stenosis bilaterally potentially impinging the descending L5 nerve roots. Patient recently underwent right L5-S1 interlaminar epidural steroid injection in our office and reports greater than 80% relief of right sided lower back and radicular symptoms. Patient states increased functionality post injection, reports he was able to ambulate without walking stick. Patient denies focal weakness, numbness and tingling. Patient denies recent trauma or falls.   Patients greatest concern today is chronic, worsening and severe right sided knee pain. Went to mountains this past weekend with his wife, reports lots of walking and carrying heavy luggage. Now states increased pain and swelling to right knee with activity and walking. Some relief with Tylenol. 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 No history of steroid injections/surgery. Patient reports right knee pain is now so severe he requires walking stick to ambulate. Patient was initially seen in our practice by Elmyra Ricks, PA whom referred patient to [place] for chronic lower back issues.    Review of Systems  Musculoskeletal:  Positive for back pain and joint pain.  Neurological:  Negative for tingling, sensory change, focal weakness and weakness.  All other systems reviewed and are negative.  Otherwise per HPI.  Assessment & Plan: Visit Diagnoses:    ICD-10-CM   1. Lumbar radiculopathy  M54.16     2. Chronic right-sided low back pain with right-sided sciatica  M54.41    G89.29     3. Other spondylosis with radiculopathy, lumbar region  M47.26     4. Chronic pain of right knee  M25.561    G89.29        Plan: Findings:  1. Chronic right sided lower back pain radiating down right lateral leg to foot. Patient continues with conservative therapies such as home exercise regimen, rest and use of medications. Significant and sustained relief of pain with recent right L5-S1 interlaminar epidural steroid injection. Next step is to continue to monitor, I did discuss possibility of repeating injection infrequently as warranted. Patient instructed to let us know if his pain returns. No red flag symptoms noted upon exam today.   2. Chronic, worsening and severe right sided knee pain. Patient continues to have severe pain despite good conservative therapies such as home exercise regimen, rest and use of medications. Right knee swelling and effusion noted  upon exam today. Next step is to have patient follow up with [facility]/Dr. Erlinda Hong to evaluate right knee. I will help patient get this appointment scheduled.    2.    Meds & Orders: No orders of the defined types were placed in this encounter.  No orders of the defined types were placed in this  encounter.   Follow-up: Return if symptoms worsen or fail to improve.   Procedures: No procedures performed      Clinical History: MRI LUMBAR SPINE WITHOUT CONTRAST   TECHNIQUE: Multiplanar, multisequence MR imaging of the lumbar spine was performed. No intravenous contrast was administered.   COMPARISON:  Lumbar spine radiograph 09/27/2021   FINDINGS: Segmentation:  Standard.   Alignment:  Physiologic.   Vertebrae: No fracture, evidence of discitis, or aggressive bone lesion.   Conus medullaris and cauda equina: Conus extends to the L1-L2 level. Conus and cauda equina appear normal.   Paraspinal and other soft tissues: Negative   Disc levels:   T12-L1: No significant spinal canal or neural foraminal narrowing.   L1-L2: No significant spinal canal or neural foraminal narrowing.   L2-L3: Mild disc bulging, ligament flavum hypertrophy mild facet arthropathy results in mild spinal canal and bilateral subarticular stenosis. No significant neural foraminal stenosis.   L3-L4: Mild disc bulging, ligament flavum hypertrophy mild facet arthropathy. Mild spinal canal and right greater than left subarticular stenosis, encroaching the descending right L4 nerve root. No significant neural foraminal stenosis.   L4-L5: Broad-based disc bulging, ligament flavum hypertrophy and mild facet arthropathy results in moderate spinal canal stenosis with high-grade subarticular stenosis bilaterally potentially impinging the descending L5 nerve roots. Mild bilateral neural foraminal stenosis.   L5-S1: Minimal disc bulging. There is advanced bilateral facet arthropathy. No spinal canal stenosis. There is mild left-sided neural foraminal stenosis. No right neural foraminal stenosis.   IMPRESSION: Multilevel degenerative changes of the lumbar spine, worst at L4-L5, where there is spinal canal stenosis with high-grade subarticular stenosis bilaterally, potentially impinging the descending  L5 nerve roots. Mild bilateral neural foraminal stenosis at this level.   Mild spinal canal and bilateral subarticular stenosis at L2-L3.   Mild spinal canal and right greater than left subarticular stenosis at L3-L4, encroaching the descending right L4 nerve root.   Mild left-sided neural foraminal stenosis at L5-S1.     Electronically Signed   By: Maurine Simmering M.D.   On: 10/10/2021 14:31   He reports that he quit smoking about 11 years ago. His smoking use included cigarettes. He has a 10.00 pack-year smoking history. He has never used smokeless tobacco.  Recent Labs    12/22/20 0000 06/23/21 0000 09/21/21 0907  HGBA1C 7.2 7.8 7.0*    Objective:  VS:  HT:    WT:   BMI:     BP: (!) 149/65  HR:73bpm  TEMP: ( )  RESP:  Physical Exam Vitals and nursing note reviewed.  HENT:     Head: Normocephalic and atraumatic.     Right Ear: External ear normal.     Left Ear: External ear normal.     Nose: Nose normal.     Mouth/Throat:     Mouth: Mucous membranes are moist.  Eyes:     Extraocular Movements: Extraocular movements intact.  Cardiovascular:     Rate and Rhythm: Normal rate.     Pulses: Normal pulses.  Pulmonary:     Effort: Pulmonary effort is normal.  Abdominal:     General: Abdomen is flat. There is  no distension.  Musculoskeletal:        General: Tenderness present.     Cervical back: Normal range of motion.     Comments: Pt rises from seated position to standing without difficulty. Good lumbar range of motion. Strong distal strength without clonus, no pain upon palpation of greater trochanters. Sensation intact bilaterally. Ambulates with walking stick, gait slow. Right knee swelling and effusion noted.  Skin:    General: Skin is warm and dry.     Capillary Refill: Capillary refill takes less than 2 seconds.  Neurological:     Mental Status: He is alert and oriented to person, place, and time.     Gait: Gait abnormal.  Psychiatric:        Mood and Affect:  Mood normal.        Behavior: Behavior normal.     Ortho Exam  Imaging: No results found.  Past Medical/Family/Surgical/Social History: Medications & Allergies reviewed per EMR, new medications updated. 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   Diverticular disease of colon 09/14/2021   Chronic kidney disease (CKD) stage G3a/A1, moderately decreased glomerular filtration rate (GFR) between 45-59 mL/min/1.73 square meter and albuminuria creatinine ratio less than 30 mg/g (HCC) 09/14/2021   Adjustment disorder 09/14/2021   History of DVT (deep vein thrombosis) 09/14/2021   Atrial tachycardia, paroxysmal ([place]) 08/10/2021   PVC's (premature ventricular contractions) 08/10/2021   Type 2 diabetes mellitus ([place]) 09/29/2019   CAD (coronary artery disease) 01/20/2014   HLD (hyperlipidemia) 01/02/2014   Obstructive sleep apnea 05/07/2013   Obesity (BMI 30-39.9) 05/07/2013   Essential hypertension, benign 05/07/2013   Past Medical History:  Diagnosis Date   Allergy    Arthritis    Atrial tachycardia, paroxysmal ([place])    Noted to have up to 14 beats in a row of atrial tachycardia on event monitor for/2023   Chronic kidney disease    Coronary artery disease 12/2013   20-30% RCA by cath- states was good.   Diabetes mellitus without complication ([place])    type II   Drug-induced myopathy    DVT (deep venous thrombosis) (HCC)    bilateral legs-greater on left. -3-5 years ago-tx. coumadin x 1 year.   Eczema    Erectile dysfunction    GERD (gastroesophageal reflux disease)    Hx of cardiovascular stress test    ETT-Myoview (7/15):  ECG with ST depression; freq PVCs, + chest pain; normal perfusion   Hyperlipidemia     LDL goal<100   Hypertension    Macular degeneration    mild   OSA (obstructive sleep apnea)    Severe w AHI 37/hr now on CPAP at 10cm H2O   PVC's (premature ventricular contractions)    Isolated PVCs, ventricular couplets and triplets, bigeminal and trigeminal PVCs with PVC load less than 1% on event monitor 08/2021   Family History  Problem Relation Age of Onset   Colon cancer Mother    CAD Father    AAA (abdominal aortic aneurysm) Father    Heart attack Father    Arthritis Father    Lung cancer Father    Hypertension Father    Lung cancer Maternal Grandmother    Stroke Maternal Grandfather 64   Cancer Paternal Grandmother  Heart attack Paternal Grandfather 49   Hypertension Other    Prostate cancer Neg Hx    Past Surgical History:  Procedure Laterality Date   APPENDECTOMY     open '68   HERNIA REPAIR Bilateral 1980   inguinal   INSERTION OF MESH N/A 06/14/2017   Procedure: INSERTION OF MESH;  Surgeon: Alphonsa Overall, MD;  Location: [facility];  Service: General;  Laterality: N/A;   LEFT HEART CATHETERIZATION WITH CORONARY ANGIOGRAM N/A 12/09/2013   Procedure: LEFT HEART CATHETERIZATION WITH CORONARY ANGIOGRAM;  Surgeon: Jettie Booze, MD;  Location: [facility] CATH LAB;  Service: Cardiovascular;  Laterality: N/A;   TONSILLECTOMY     UMBILICAL HERNIA REPAIR N/A 06/14/2017   Procedure: LAPAROSCOPIC UMBILICAL HERNIA REPAIR WITH MESH ERAS PATHWAY;  Surgeon: Alphonsa Overall, MD;  Location: [facility];  Service: General;  Laterality: N/A;  ERAS PATHWAY   VASECTOMY     Social History   Occupational History   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

## 2021-12-15 ENCOUNTER — Ambulatory Visit: Payer: Medicare Other | Admitting: Orthopaedic Surgery

## 2021-12-15 ENCOUNTER — Other Ambulatory Visit: Payer: Self-pay | Admitting: Physician Assistant

## 2021-12-15 MED ORDER — HYDROCODONE-ACETAMINOPHEN 5-325 MG PO TABS: 1.0000 | ORAL_TABLET | Freq: Three times a day (TID) | ORAL | 0 refills | Status: DC | PRN

## 2021-12-15 NOTE — Telephone Encounter (Signed)
I sent in norco.  Please make sure  no fevers or chills or other signs of infection

## 2021-12-16 ENCOUNTER — Ambulatory Visit (INDEPENDENT_AMBULATORY_CARE_PROVIDER_SITE_OTHER): Payer: Medicare Other | Admitting: Orthopaedic Surgery

## 2021-12-16 ENCOUNTER — Encounter: Payer: Self-pay | Admitting: Orthopaedic Surgery

## 2021-12-16 ENCOUNTER — Ambulatory Visit (INDEPENDENT_AMBULATORY_CARE_PROVIDER_SITE_OTHER): Payer: Medicare Other

## 2021-12-16 DIAGNOSIS — M25561 Pain in right knee: Secondary | ICD-10-CM | POA: Diagnosis not present

## 2021-12-16 DIAGNOSIS — I2510 Atherosclerotic heart disease of native coronary artery without angina pectoris: Secondary | ICD-10-CM

## 2021-12-16 MED ORDER — HYDROCODONE-ACETAMINOPHEN 5-325 MG PO TABS: 1.0000 | ORAL_TABLET | Freq: Four times a day (QID) | ORAL | 0 refills | Status: DC | PRN

## 2021-12-16 MED ORDER — LIDOCAINE HCL 1 % IJ SOLN
2.0000 mL | INTRAMUSCULAR | Status: AC | PRN
  Administered 2021-12-16: 2 mL

## 2021-12-16 MED ORDER — BUPIVACAINE HCL 0.25 % IJ SOLN
2.0000 mL | INTRAMUSCULAR | Status: AC | PRN
  Administered 2021-12-16: 2 mL via INTRA_ARTICULAR

## 2021-12-16 NOTE — Progress Notes (Signed)
Office Visit Note   Patient: Carl Knapp           Date of Birth: June 17, 1945           MRN: [id number] Visit Date: 12/16/2021              Requested by: Colon Branch, MD [address] [place],  [place] [address] PCP: Colon Branch, MD   Assessment & Plan: Visit Diagnoses:  1. Acute pain of right knee     Plan: Impression is acute right knee pain and effusion.  He currently does not have any fevers, chills or other constitutional symptoms, however due to the immediate onset of pain, severity and effusion I am concerned for possible infection.  He does not have a history of gout, but that is also a possibility.  Today, I aspirated approximately 95 cc of straw-colored fluid from the right knee.  This was slightly murky.  We have sent this off for cell count, crystals and culture.  I discussed holding off on cortisone injection as had like to rule out infection first.  We will call him with the results and should this be negative for infection he will come back in for cortisone injection.  I have refilled his Norco.  Should he develop any fevers, chills or any other worsening symptoms he will let us know.  In regards to the inability to fully extend his knee, it is hard to tell if this is from the knee or possibly from his underlying lumbar pathology.  We will keep a close eye on this as well.  Total face to face encounter time was greater than 25 minutes and over half of this time was spent in counseling and/or coordination of care.  Follow-Up Instructions: Return if symptoms worsen or fail to improve.   Orders:  Orders Placed This Encounter  Procedures   Anaerobic and Aerobic Culture   XR KNEE 3 VIEW RIGHT   Synovial Fluid Analysis, Complete   Synovial fluid, crystal   Meds ordered this encounter  Medications   HYDROcodone-acetaminophen (NORCO/VICODIN) 5-325 MG tablet    Sig: Take 1 tablet by mouth every 6 (six) hours as needed for moderate pain.    Dispense:  20 tablet     Refill:  0      Procedures: Large Joint Inj: R knee on 12/16/2021 9:38 AM Indications: pain Details: 22 G needle, anterolateral approach Medications: 2 mL lidocaine 1 %; 2 mL bupivacaine 0.25 %      Clinical Data: No additional findings.   Subjective: Chief Complaint  Patient presents with   Right Knee - Pain    HPI patient is a pleasant 76 year old gentleman who comes in today with right knee pain for the past 2 days.  He denies any injury but does note he took a trip to the mountains last week where he did some walking as well as had to caring suitcases up a flight of stairs.  The pain he has is to the entire knee and into the distal anterior thigh.  He describes this as a constant pain worse with bearing weight.  He has trouble actively extending his leg more from weakness rather than pain.  He has been taking Norco which is significantly helped.  He denies any fevers or chills or any other constitutional symptoms.  No history of gout.  No recent infection or antibiotic use.  Of note, he has a history of right-sided lumbar radiculopathy  for which she was seen by Dr. Ernestina Patches about a month ago where cortisone injection was performed.  He notes that this is significantly helped.  Review of Systems as detailed in HPI.  All others reviewed and are negative.   Objective: Vital Signs: There were no vitals taken for this visit.  Physical Exam well-developed well-nourished gentleman in no acute distress.  Alert and oriented x 3.  Ortho Exam right knee exam shows a moderate to large effusion.  No warmth no erythema.  He has active range of motion from approximately 60 to 90 degrees.  I am able to passively extend him to approximately 10 degrees without pain.  He does have slight medial joint line tenderness.  No evidence of dropfoot.  He is neurovascularly intact distally.  Specialty Comments:  MRI LUMBAR SPINE WITHOUT CONTRAST   TECHNIQUE: Multiplanar, multisequence MR imaging of  the lumbar spine was performed. No intravenous contrast was administered.   COMPARISON:  Lumbar spine radiograph 09/27/2021   FINDINGS: Segmentation:  Standard.   Alignment:  Physiologic.   Vertebrae: No fracture, evidence of discitis, or aggressive bone lesion.   Conus medullaris and cauda equina: Conus extends to the L1-L2 level. Conus and cauda equina appear normal.   Paraspinal and other soft tissues: Negative   Disc levels:   T12-L1: No significant spinal canal or neural foraminal narrowing.   L1-L2: No significant spinal canal or neural foraminal narrowing.   L2-L3: Mild disc bulging, ligament flavum hypertrophy mild facet arthropathy results in mild spinal canal and bilateral subarticular stenosis. No significant neural foraminal stenosis.   L3-L4: Mild disc bulging, ligament flavum hypertrophy mild facet arthropathy. Mild spinal canal and right greater than left subarticular stenosis, encroaching the descending right L4 nerve root. No significant neural foraminal stenosis.   L4-L5: Broad-based disc bulging, ligament flavum hypertrophy and mild facet arthropathy results in moderate spinal canal stenosis with high-grade subarticular stenosis bilaterally potentially impinging the descending L5 nerve roots. Mild bilateral neural foraminal stenosis.   L5-S1: Minimal disc bulging. There is advanced bilateral facet arthropathy. No spinal canal stenosis. There is mild left-sided neural foraminal stenosis. No right neural foraminal stenosis.   IMPRESSION: Multilevel degenerative changes of the lumbar spine, worst at L4-L5, where there is spinal canal stenosis with high-grade subarticular stenosis bilaterally, potentially impinging the descending L5 nerve roots. Mild bilateral neural foraminal stenosis at this level.   Mild spinal canal and bilateral subarticular stenosis at L2-L3.   Mild spinal canal and right greater than left subarticular stenosis at L3-L4,  encroaching the descending right L4 nerve root.   Mild left-sided neural foraminal stenosis at L5-S1.     Electronically Signed   By: Maurine Simmering M.D.   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   Chronic kidney disease (CKD) stage G3a/A1, moderately decreased glomerular filtration rate (GFR) between 45-59 mL/min/1.73 square meter and albuminuria creatinine ratio less than 30 mg/g (HCC) 09/14/2021   Adjustment disorder 09/14/2021   History of DVT (deep vein thrombosis) 09/14/2021   Atrial tachycardia, paroxysmal ([place])  08/10/2021   PVC's (premature ventricular contractions) 08/10/2021   Type 2 diabetes mellitus ([place]) 09/29/2019   CAD (coronary artery disease) 01/20/2014   HLD (hyperlipidemia) 01/02/2014   Obstructive sleep apnea 05/07/2013   Obesity (BMI 30-39.9) 05/07/2013   Essential hypertension, benign 05/07/2013   Past Medical History:  Diagnosis Date   Allergy    Arthritis    Atrial tachycardia, paroxysmal (HCC)    Noted to have up to 14 beats in a row of atrial tachycardia on event monitor for/2023   Chronic kidney disease    Coronary artery disease 12/2013   20-30% RCA by cath- states was good.   Diabetes mellitus without complication ([place])    type II   Drug-induced myopathy    DVT (deep venous thrombosis) (HCC)    bilateral legs-greater on left. -3-5 years ago-tx. coumadin x 1 year.   Eczema    Erectile dysfunction    GERD (gastroesophageal reflux disease)    Hx of  cardiovascular stress test    ETT-Myoview (7/15):  ECG with ST depression; freq PVCs, + chest pain; normal perfusion   Hyperlipidemia    LDL goal<100   Hypertension    Macular degeneration    mild   OSA (obstructive sleep apnea)    Severe w AHI 37/hr now on CPAP at 10cm H2O   PVC's (premature ventricular contractions)    Isolated PVCs, ventricular couplets and triplets, bigeminal and trigeminal PVCs with PVC load less than 1% on event monitor 08/2021    Family History  Problem Relation Age of Onset   Colon cancer Mother    CAD Father    AAA (abdominal aortic aneurysm) Father    Heart attack Father    Arthritis Father    Lung cancer Father    Hypertension Father    Lung cancer Maternal Grandmother    Stroke Maternal Grandfather 84   Cancer Paternal Grandmother    Heart attack Paternal Grandfather 19   Hypertension Other    Prostate cancer Neg Hx     Past Surgical History:  Procedure Laterality Date   APPENDECTOMY     open '68   HERNIA REPAIR Bilateral 1980   inguinal   INSERTION OF MESH N/A 06/14/2017   Procedure: INSERTION OF MESH;  Surgeon: Alphonsa Overall, MD;  Location: [facility];  Service: General;  Laterality: N/A;   LEFT HEART CATHETERIZATION WITH CORONARY ANGIOGRAM N/A 12/09/2013   Procedure: LEFT HEART CATHETERIZATION WITH CORONARY ANGIOGRAM;  Surgeon: Jettie Booze, MD;  [facility];  Service: Cardiovascular;  Laterality: N/A;   TONSILLECTOMY     UMBILICAL HERNIA REPAIR N/A 06/14/2017   Procedure: LAPAROSCOPIC UMBILICAL HERNIA REPAIR WITH MESH ERAS PATHWAY;  Surgeon: Alphonsa Overall, MD;  Location: [facility];  Service: General;  Laterality: N/A;  ERAS PATHWAY   VASECTOMY     Social History   Occupational History   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

## 2021-12-16 NOTE — Telephone Encounter (Signed)
Pt was seen in office this morning, will close out message.

## 2021-12-21 ENCOUNTER — Ambulatory Visit (INDEPENDENT_AMBULATORY_CARE_PROVIDER_SITE_OTHER): Payer: Medicare Other | Admitting: Internal Medicine

## 2021-12-21 ENCOUNTER — Encounter: Payer: Self-pay | Admitting: Internal Medicine

## 2021-12-21 VITALS — BP 138/70 | HR 71 | Temp 97.9°F | Resp 18 | Ht 70.0 in | Wt 221.5 lb

## 2021-12-21 DIAGNOSIS — E7800 Pure hypercholesterolemia, unspecified: Secondary | ICD-10-CM | POA: Diagnosis not present

## 2021-12-21 DIAGNOSIS — E1169 Type 2 diabetes mellitus with other specified complication: Secondary | ICD-10-CM

## 2021-12-21 DIAGNOSIS — I2510 Atherosclerotic heart disease of native coronary artery without angina pectoris: Secondary | ICD-10-CM | POA: Diagnosis not present

## 2021-12-21 DIAGNOSIS — G459 Transient cerebral ischemic attack, unspecified: Secondary | ICD-10-CM | POA: Diagnosis not present

## 2021-12-21 DIAGNOSIS — I10 Essential (primary) hypertension: Secondary | ICD-10-CM | POA: Diagnosis not present

## 2021-12-21 MED ORDER — PIOGLITAZONE HCL 45 MG PO TABS: 45.0000 mg | ORAL_TABLET | Freq: Every morning | ORAL | 1 refills | Status: DC

## 2021-12-21 NOTE — Progress Notes (Signed)
Subjective:    Patient ID: Carl Knapp, male    DOB: April 01, 1946, 76 y.o.   MRN: [id number]  DOS:  12/21/2021 Type of visit - description: Follow-up  Since the last office visit, saw neurology for history of mental status changes and headache.  Note reviewed.  Since then, no further problems. Reports ambulatory CBGs in the 130s. Normal ambulatory BPs He is trying to eat healthier, is trying to be active.  Unfortunately he developed right knee pain after hiking @ the mountains.  Denies fever chills No chest pain or difficulty breathing O2 sat at home typically in the mid 90s.  Review of Systems See above   Past Medical History:  Diagnosis Date   Allergy    Arthritis    Atrial tachycardia, paroxysmal (HCC)    Noted to have up to 14 beats in a row of atrial tachycardia on event monitor for/2023   Chronic kidney disease    Coronary artery disease 12/2013   20-30% RCA by cath- states was good.   Diabetes mellitus without complication ([place])    type II   Drug-induced myopathy    DVT (deep venous thrombosis) (HCC)    bilateral legs-greater on left. -3-5 years ago-tx. coumadin x 1 year.   Eczema    Erectile dysfunction    GERD (gastroesophageal reflux disease)    Hx of cardiovascular stress test    ETT-Myoview (7/15):  ECG with ST depression; freq PVCs, + chest pain; normal perfusion   Hyperlipidemia    LDL goal<100   Hypertension    Macular degeneration    mild   OSA (obstructive sleep apnea)    Severe w AHI 37/hr now on CPAP at 10cm H2O   PVC's (premature ventricular contractions)    Isolated PVCs, ventricular couplets and triplets, bigeminal and trigeminal PVCs with PVC load less than 1% on event monitor 08/2021    Past Surgical History:  Procedure Laterality Date   APPENDECTOMY     open '68   HERNIA REPAIR Bilateral 1980   inguinal   INSERTION OF MESH N/A 06/14/2017   Procedure: INSERTION OF MESH;  Surgeon: Alphonsa Overall, MD;  Location: [facility];  Service: General;   Laterality: N/A;   LEFT HEART CATHETERIZATION WITH CORONARY ANGIOGRAM N/A 12/09/2013   Procedure: LEFT HEART CATHETERIZATION WITH CORONARY ANGIOGRAM;  Surgeon: Jettie Booze, MD;  Location: [facility] CATH LAB;  Service: Cardiovascular;  Laterality: N/A;   TONSILLECTOMY     UMBILICAL HERNIA REPAIR N/A 06/14/2017   Procedure: LAPAROSCOPIC UMBILICAL HERNIA REPAIR WITH MESH ERAS PATHWAY;  Surgeon: Alphonsa Overall, MD;  Location: [facility];  Service: General;  Laterality: N/A;  ERAS PATHWAY   VASECTOMY      Current Outpatient Medications  Medication Instructions   acetaminophen (TYLENOL) 1,000 mg, Oral, Daily PRN   amLODipine (NORVASC) 10 mg, Oral, Daily   aspirin EC 81 mg, Daily   atorvastatin (LIPITOR) 40 mg, Oral, Daily   HYDROcodone-acetaminophen (NORCO/VICODIN) 5-325 MG tablet 1 tablet, Oral, Every 6 hours PRN   ketoconazole (NIZORAL) 2 % cream 1 application , Topical, 2 times daily PRN   loratadine (CLARITIN) 10 mg, Oral, Daily PRN   losartan (COZAAR) 100 mg, Oral, Daily   metFORMIN (GLUCOPHAGE) 1,000 mg, Oral, 2 times daily with meals   methocarbamol (ROBAXIN) 500 mg, Oral, 2 times daily PRN   Multiple Vitamins-Minerals (PRESERVISION AREDS 2) CAPS 1 capsule, Oral, 2 times daily   multivitamin (ONE-A-DAY MEN'S) TABS tablet 1 tablet, Oral, Daily   Omega-3  Fatty Acids (FISH OIL) 1000 MG CAPS 1 capsule, Oral, 2 times daily   pantoprazole (PROTONIX) 40 mg, Oral, Daily before breakfast   pioglitazone (ACTOS) 45 mg, Oral, Every morning   sildenafil (REVATIO) 100 mg, Oral, Daily PRN   traMADol (ULTRAM) 50 mg, Oral, 2 times daily PRN   triamcinolone cream (KENALOG) 0.1 % 1 application , Topical, 2 times daily PRN       Objective:   Physical Exam BP 138/70   Pulse 71   Temp 97.9 F (36.6 C) (Oral)   Resp 18   Ht '5\' 10"'$  (1.778 m)   Wt 221 lb 8 oz (100.5 kg)   SpO2 93%   BMI 31.78 kg/m  General:   Well developed, NAD, BMI noted. HEENT:  Normocephalic . Face symmetric, atraumatic DM  foot exam: No edema, good pedal pulses, pinprick examination normal. Skin: Not pale. Not jaundice Neurologic:  alert & oriented X3.  Speech normal, gait: Slightly limited by knee pain, uses a cane. Psych--  Cognition and judgment appear intact.  Cooperative with normal attention span and concentration.  Behavior appropriate. No anxious or depressed appearing.      Assessment     Assessment  (new patient 09/2021, previous PCP retiring) DM dx ~ 2010 HTN High cholesterol CKD CV: - CAD - TIA (?)  June 2023 GERD (remotely) Supraventricular tachycardia OSA, Cpap Macular degeneration H/o DVT  PLAN DM:  on metformin, since LOV pioglitazone was increased to 45 mg daily, Rx sent.  Ambulatory CBGs in the 130s, he is trying to be more active.  Check A1c. HTN: Since the last visit he is under much better control, currently on amlodipine, losartan.  Check CMP High cholesterol: Last LDL 10/10/2021: 80, goal < 70.  He increase atorvastatin to 40 mg about 2 months ago, recheck FLP, if not at goal we will adjust medication. Mental status changes, headaches: Saw neurology 11/28/2021 for further evaluation.  Impression was possible TIA versus hypertensive encephalopathy versus migraines Plan: Optimize cardiovascular risk reduction. Consider as needed migraine treatment. TIA: See above Hypoxia: See last visit, resolved, at home when checked, oxygen is in the mid 90s range.   MSK:  - Lumbar radiculopathy: s/p local injection >> helped  -R knee injury: Under the care of Ortho. RTC 4 months

## 2021-12-21 NOTE — Patient Instructions (Addendum)
Please schedule patient for fasting  blood work early tomorrow morning  (12-22-2021)  Recommend to proceed with the following vaccines at your pharmacy:  Covid booster (bivalent) Flu shot this fall  Check the  blood pressure regularly BP GOAL is between 110/65 and  135/85. If it is consistently higher or lower, let me know    GO TO THE FRONT DESK, [place] back for   a checkup in 4 months

## 2021-12-21 NOTE — Assessment & Plan Note (Signed)
DM:  on metformin, since LOV pioglitazone was increased to 45 mg daily, Rx sent.  Ambulatory CBGs in the 130s, he is trying to be more active.  Check A1c. HTN: Since the last visit he is under much better control, currently on amlodipine, losartan.  Check CMP High cholesterol: Last LDL 10/10/2021: 80, goal < 70.  He increase atorvastatin to 40 mg about 2 months ago, recheck FLP, if not at goal we will adjust medication. Mental status changes, headaches: Saw neurology 11/28/2021 for further evaluation.  Impression was possible TIA versus hypertensive encephalopathy versus migraines Plan: Optimize cardiovascular risk reduction. Consider as needed migraine treatment. TIA: See above Hypoxia: See last visit, resolved, at home when checked, oxygen is in the mid 90s range.   MSK:  - Lumbar radiculopathy: s/p local injection >> helped  -R knee injury: Under the care of Ortho. RTC 4 months

## 2021-12-22 ENCOUNTER — Other Ambulatory Visit (INDEPENDENT_AMBULATORY_CARE_PROVIDER_SITE_OTHER): Payer: Medicare Other

## 2021-12-22 DIAGNOSIS — E1169 Type 2 diabetes mellitus with other specified complication: Secondary | ICD-10-CM | POA: Diagnosis not present

## 2021-12-22 DIAGNOSIS — I10 Essential (primary) hypertension: Secondary | ICD-10-CM

## 2021-12-22 DIAGNOSIS — E7800 Pure hypercholesterolemia, unspecified: Secondary | ICD-10-CM

## 2021-12-22 LAB — ANAEROBIC AND AEROBIC CULTURE
AER RESULT:: NO GROWTH
MICRO NUMBER:: 13767878
MICRO NUMBER:: 13767879
SPECIMEN QUALITY:: ADEQUATE
SPECIMEN QUALITY:: ADEQUATE

## 2021-12-22 LAB — COMPREHENSIVE METABOLIC PANEL
ALT: 32 U/L (ref 0–53)
AST: 26 U/L (ref 0–37)
Albumin: 3.9 g/dL (ref 3.5–5.2)
Alkaline Phosphatase: 62 U/L (ref 39–117)
BUN: 23 mg/dL (ref 6–23)
CO2: 29 mEq/L (ref 19–32)
Calcium: 8.8 mg/dL (ref 8.4–10.5)
Chloride: 102 mEq/L (ref 96–112)
Creatinine, Ser: 1.09 mg/dL (ref 0.40–1.50)
GFR: 66.51 mL/min (ref >60.00)
Glucose, Bld: 134 mg/dL — ABNORMAL HIGH (ref 70–99)
Potassium: 4.4 mEq/L (ref 3.5–5.1)
Sodium: 140 mEq/L (ref 135–145)
Total Bilirubin: 0.4 mg/dL (ref 0.2–1.2)
Total Protein: 6.8 g/dL (ref 6.0–8.3)

## 2021-12-22 LAB — SYNOVIAL FLUID ANALYSIS, COMPLETE
Basophils, %: 0 %
Eosinophils-Synovial: 0 % (ref 0–2)
Lymphocytes-Synovial Fld: 5 % (ref 0–74)
Monocyte/Macrophage: 9 % (ref 0–69)
Neutrophil, Synovial: 86 % — ABNORMAL HIGH (ref 0–24)
Synoviocytes, %: 0 % (ref 0–15)
WBC, Synovial: 19280 cells/uL — ABNORMAL HIGH (ref <150)

## 2021-12-22 LAB — LIPID PANEL
Cholesterol: 136 mg/dL (ref 0–200)
HDL: 35.1 mg/dL — ABNORMAL LOW (ref >39.00)
LDL Cholesterol: 75 mg/dL (ref 0–99)
NonHDL: 101.08
Total CHOL/HDL Ratio: 4
Triglycerides: 132 mg/dL (ref 0.0–149.0)
VLDL: 26.4 mg/dL (ref 0.0–40.0)

## 2021-12-22 LAB — HEMOGLOBIN A1C: Hgb A1c MFr Bld: 8.6 % — ABNORMAL HIGH (ref 4.6–6.5)

## 2021-12-23 ENCOUNTER — Other Ambulatory Visit: Payer: Self-pay | Admitting: Internal Medicine

## 2021-12-23 ENCOUNTER — Telehealth: Payer: Self-pay | Admitting: Internal Medicine

## 2021-12-23 MED ORDER — EMPAGLIFLOZIN 10 MG PO TABS: 10.0000 mg | ORAL_TABLET | Freq: Every day | ORAL | 0 refills | Status: DC

## 2021-12-23 MED ORDER — ATORVASTATIN CALCIUM 40 MG PO TABS: 60.0000 mg | ORAL_TABLET | Freq: Every day | ORAL | 1 refills | Status: DC

## 2021-12-23 MED ORDER — PANTOPRAZOLE SODIUM 40 MG PO TBEC: 40.0000 mg | DELAYED_RELEASE_TABLET | Freq: Every day | ORAL | 1 refills | Status: DC

## 2021-12-23 NOTE — Addendum Note (Signed)
Addended byDamita Dunnings D on: 12/23/2021 08:18 AM   Modules accepted: Orders

## 2021-12-23 NOTE — Telephone Encounter (Signed)
PA approved. Effective 12/23/21 until further notice.

## 2021-12-23 NOTE — Telephone Encounter (Signed)
PA initiated via Covermymeds; KEY: [id number]. Awaiting determination.

## 2022-01-27 DIAGNOSIS — Z23 Encounter for immunization: Secondary | ICD-10-CM | POA: Diagnosis not present

## 2022-02-03 ENCOUNTER — Other Ambulatory Visit (INDEPENDENT_AMBULATORY_CARE_PROVIDER_SITE_OTHER): Payer: Medicare Other

## 2022-02-03 DIAGNOSIS — E1169 Type 2 diabetes mellitus with other specified complication: Secondary | ICD-10-CM | POA: Diagnosis not present

## 2022-02-03 LAB — BASIC METABOLIC PANEL
BUN: 23 mg/dL (ref 6–23)
CO2: 26 mEq/L (ref 19–32)
Calcium: 9.1 mg/dL (ref 8.4–10.5)
Chloride: 102 mEq/L (ref 96–112)
Creatinine, Ser: 1.11 mg/dL (ref 0.40–1.50)
GFR: 65.02 mL/min (ref >60.00)
Glucose, Bld: 107 mg/dL — ABNORMAL HIGH (ref 70–99)
Potassium: 3.7 mEq/L (ref 3.5–5.1)
Sodium: 140 mEq/L (ref 135–145)

## 2022-04-24 ENCOUNTER — Ambulatory Visit (INDEPENDENT_AMBULATORY_CARE_PROVIDER_SITE_OTHER): Payer: Medicare Other | Admitting: Internal Medicine

## 2022-04-24 ENCOUNTER — Encounter: Payer: Self-pay | Admitting: Internal Medicine

## 2022-04-24 VITALS — BP 138/62 | HR 67 | Temp 98.1°F | Resp 18 | Ht 70.0 in | Wt 222.0 lb

## 2022-04-24 DIAGNOSIS — E7800 Pure hypercholesterolemia, unspecified: Secondary | ICD-10-CM

## 2022-04-24 DIAGNOSIS — I10 Essential (primary) hypertension: Secondary | ICD-10-CM

## 2022-04-24 DIAGNOSIS — E1169 Type 2 diabetes mellitus with other specified complication: Secondary | ICD-10-CM

## 2022-04-24 DIAGNOSIS — N529 Male erectile dysfunction, unspecified: Secondary | ICD-10-CM

## 2022-04-24 DIAGNOSIS — I2510 Atherosclerotic heart disease of native coronary artery without angina pectoris: Secondary | ICD-10-CM | POA: Diagnosis not present

## 2022-04-24 LAB — LIPID PANEL
Cholesterol: 145 mg/dL (ref 0–200)
HDL: 46.4 mg/dL (ref >39.00)
LDL Cholesterol: 82 mg/dL (ref 0–99)
NonHDL: 98.94
Total CHOL/HDL Ratio: 3
Triglycerides: 84 mg/dL (ref 0.0–149.0)
VLDL: 16.8 mg/dL (ref 0.0–40.0)

## 2022-04-24 LAB — AST: AST: 22 U/L (ref 0–37)

## 2022-04-24 LAB — ALT: ALT: 27 U/L (ref 0–53)

## 2022-04-24 LAB — HEMOGLOBIN A1C: Hgb A1c MFr Bld: 7.3 % — ABNORMAL HIGH (ref 4.6–6.5)

## 2022-04-24 MED ORDER — TADALAFIL 20 MG PO TABS: 10.0000 mg | ORAL_TABLET | ORAL | 2 refills | Status: DC | PRN

## 2022-04-24 NOTE — Patient Instructions (Addendum)
Stop sildenafil, try Cialis 1 or 2 tablets a day, no more frequent at every 2 days.  Continue taking Jardiance 10 mg half tablet daily, depending on your results I may ask you to take half tablet twice daily     GO TO THE LAB : Get the blood work     [place], [place] back for   a checkup in 3 months   Per our records you are due for your diabetic eye exam. Please contact your eye doctor to schedule an appointment. Please have them send copies of your office visit notes to [place]. Our fax number is [phone]. If you need a referral to an eye doctor please let us know.

## 2022-04-24 NOTE — Progress Notes (Signed)
Subjective:    Patient ID: Carl Knapp, male    DOB: 1946/04/01, 76 y.o.   MRN: [id number]  DOS:  04/24/2022 Type of visit - description: f/u  Routine follow-up Today we talk about diabetes, CAD, ED, PPIs. He started [place], denies any rash.  CBGs in the 115. Good compliance with increased dose of atorvastatin  Denies chest pain or difficulty breathing No nausea vomiting No acid reflux symptoms.  Stop PPIs?  Review of Systems See above   Past Medical History:  Diagnosis Date   Allergy    Arthritis    Atrial tachycardia, paroxysmal    Noted to have up to 14 beats in a row of atrial tachycardia on event monitor for/2023   Chronic kidney disease    Coronary artery disease 12/2013   20-30% RCA by cath- states was good.   Diabetes mellitus without complication ([place])    type II   Drug-induced myopathy    DVT (deep venous thrombosis) (HCC)    bilateral legs-greater on left. -3-5 years ago-tx. coumadin x 1 year.   Eczema    Erectile dysfunction    GERD (gastroesophageal reflux disease)    Hx of cardiovascular stress test    ETT-Myoview (7/15):  ECG with ST depression; freq PVCs, + chest pain; normal perfusion   Hyperlipidemia    LDL goal<100   Hypertension    Macular degeneration    mild   OSA (obstructive sleep apnea)    Severe w AHI 37/hr now on CPAP at 10cm H2O   PVC's (premature ventricular contractions)    Isolated PVCs, ventricular couplets and triplets, bigeminal and trigeminal PVCs with PVC load less than 1% on event monitor 08/2021    Past Surgical History:  Procedure Laterality Date   APPENDECTOMY     open '68   HERNIA REPAIR Bilateral 1980   inguinal   INSERTION OF MESH N/A 06/14/2017   Procedure: INSERTION OF MESH;  Surgeon: Alphonsa Overall, MD;  Location: [facility];  Service: General;  Laterality: N/A;   LEFT HEART CATHETERIZATION WITH CORONARY ANGIOGRAM N/A 12/09/2013   Procedure: LEFT HEART CATHETERIZATION WITH CORONARY ANGIOGRAM;  Surgeon: Jettie Booze, MD;  Location: [facility] CATH LAB;  Service: Cardiovascular;  Laterality: N/A;   TONSILLECTOMY     UMBILICAL HERNIA REPAIR N/A 06/14/2017   Procedure: LAPAROSCOPIC UMBILICAL HERNIA REPAIR WITH MESH ERAS PATHWAY;  Surgeon: Alphonsa Overall, MD;  Location: [facility];  Service: General;  Laterality: N/A;  ERAS PATHWAY   VASECTOMY      Current Outpatient Medications  Medication Instructions   acetaminophen (TYLENOL) 1,000 mg, Oral, Daily PRN   amLODipine (NORVASC) 10 mg, Oral, Daily   aspirin EC 81 mg, Daily   atorvastatin (LIPITOR) 60 mg, Oral, Daily   empagliflozin (JARDIANCE) 10 mg, Oral, Daily before breakfast   ketoconazole (NIZORAL) 2 % cream 1 application , Topical, 2 times daily PRN   loratadine (CLARITIN) 10 mg, Oral, Daily PRN   losartan (COZAAR) 100 mg, Oral, Daily   metFORMIN (GLUCOPHAGE) 1,000 mg, Oral, 2 times daily with meals   methocarbamol (ROBAXIN) 500 mg, Oral, 2 times daily PRN   Multiple Vitamins-Minerals (PRESERVISION AREDS 2) CAPS 1 capsule, Oral, 2 times daily   multivitamin (ONE-A-DAY MEN'S) TABS tablet 1 tablet, Oral, Daily   Omega-3 Fatty Acids (FISH OIL) 1000 MG CAPS 1 capsule, Oral, 2 times daily   pantoprazole (PROTONIX) 40 mg, Oral, Daily before breakfast   pioglitazone (ACTOS) 45 mg, Oral, Every morning   tadalafil (  CIALIS) 10-20 mg, Oral, Every 48 hours PRN   triamcinolone cream (KENALOG) 0.1 % 1 application , Topical, 2 times daily PRN       Objective:   Physical Exam BP 138/62   Pulse 67   Temp 98.1 F (36.7 C) (Oral)   Resp 18   Ht '5\' 10"'$  (1.778 m)   Wt 222 lb (100.7 kg)   SpO2 98%   BMI 31.85 kg/m  General:   Well developed, NAD, BMI noted. HEENT:  Normocephalic . Face symmetric, atraumatic Lungs:  CTA B Normal respiratory effort, no intercostal retractions, no accessory muscle use. Heart: RRR,  no murmur.  Lower extremities: no pretibial edema bilaterally  Skin: Not pale. Not jaundice Neurologic:  alert & oriented X3.  Speech normal,  gait appropriate for age and unassisted Psych--  Cognition and judgment appear intact.  Cooperative with normal attention span and concentration.  Behavior appropriate. No anxious or depressed appearing.      Assessment     Assessment  (new patient 09/2021, previous PCP retiring) DM dx ~ 2010 HTN High cholesterol CKD CV: - CAD, dr turner  - TIA (?)  June 2023 GERD (remotely) Supraventricular tachycardia OSA, Cpap, Dr Radford Pax  Macular degeneration H/o DVT  PLAN DM: Last A1c 8.6, Jardiance 10 mg  was added  ( f/u BMP okay), he is only able to tolerate half tablet daily due to GI cramps.  He also takes metformin and Actos. ambulatory CBGs in the 115. Reports his diet is relatively healthy and he swims 3 times a week.   Plan: Check A1c, further advised with results, consider increase Jardiance to half tablet twice daily.  Advised to see about improving his diet if possible. HTN:BP looks very good, continue losartan, amlodipine. High cholesterol: Last LDL 75, goal less than 70, atorvastatin increased from 40 to 60 mg.  Checking labs Chest pain: No further chest pain, PPIs were starteded thinking that  CP could be GI related, he is completely asymptomatic now, we agreed to wean off PPIs. ED: Sildenafil is not effective, we agreed to try Cialis as needed.  Next step could be to see urology. Preventive care reviewed. RTC 3 months  Td 2019. PNM 13 2015 PNM 23: 2013, 2023. Shingrix x 2 RSV- had one already [place]- UTD per pt  Had a flu shot 12/24/2020: Colonoscopy, polypoid colonic mucosa, no further colonoscopies Prostate cancer screening: PSA 0.16 June 2021     ====  DM:  on metformin, since LOV pioglitazone was increased to 45 mg daily, Rx sent.  Ambulatory CBGs in the 130s, he is trying to be more active.  Check A1c. HTN: Since the last visit he is under much better control, currently on amlodipine, losartan.  Check CMP High cholesterol: Last LDL 10/10/2021: 80, goal <  70.  He increase atorvastatin to 40 mg about 2 months ago, recheck FLP, if not at goal we will adjust medication. Mental status changes, headaches: Saw neurology 11/28/2021 for further evaluation.  Impression was possible TIA versus hypertensive encephalopathy versus migraines Plan: Optimize cardiovascular risk reduction. Consider as needed migraine treatment. TIA: See above Hypoxia: See last visit, resolved, at home when checked, oxygen is in the mid 90s range.   MSK:  - Lumbar radiculopathy: s/p local injection >> helped  -R knee injury: Under the care of Ortho. RTC 4 months

## 2022-04-24 NOTE — Assessment & Plan Note (Signed)
DM: Last A1c 8.6, Jardiance 10 mg  was added  ( f/u BMP okay), he is only able to tolerate half tablet daily due to GI cramps.  He also takes metformin and Actos. ambulatory CBGs in the 115. Reports his diet is relatively healthy and he swims 3 times a week.   Plan: Check A1c, further advised with results, consider increase Jardiance to half tablet twice daily.  Advised to see about improving his diet if possible. HTN:BP looks very good, continue losartan, amlodipine. High cholesterol: Last LDL 75, goal less than 70, atorvastatin increased from 40 to 60 mg.  Checking labs Chest pain: No further chest pain, PPIs were starteded thinking that  CP could be GI related, he is completely asymptomatic now, we agreed to wean off PPIs. ED: Sildenafil is not effective, we agreed to try Cialis as needed.  Next step could be to see urology. Preventive care reviewed. RTC 3 months

## 2022-04-25 ENCOUNTER — Telehealth: Payer: Self-pay | Admitting: Internal Medicine

## 2022-04-25 MED ORDER — TADALAFIL 20 MG PO TABS: 10.0000 mg | ORAL_TABLET | ORAL | 2 refills | Status: DC | PRN

## 2022-04-25 NOTE — Telephone Encounter (Signed)
Pt would like to have the following Rx rerouted to the following pharmacy and have it sent there for future refills:  Prescription Request  04/25/2022  Is this a "Controlled Substance" medicine? No  LOV: 04/24/2022  What is the name of the medication or equipment?   tadalafil (CIALIS) 20 MG tablet [[id number]]   Have you contacted your pharmacy to request a refill? No   Which pharmacy would you like this sent to?    [facility], [place], [place] [address] P: [phone]  Patient notified that their request is being sent to the clinical staff for review and that they should receive a response within 2 business days.   Please advise at Mobile [phone] (mobile)

## 2022-04-25 NOTE — Telephone Encounter (Signed)
Rx re-sent to [facility]

## 2022-04-26 ENCOUNTER — Other Ambulatory Visit: Payer: Self-pay | Admitting: Internal Medicine

## 2022-04-26 DIAGNOSIS — Z0000 Encounter for general adult medical examination without abnormal findings: Secondary | ICD-10-CM | POA: Insufficient documentation

## 2022-04-26 MED ORDER — ATORVASTATIN CALCIUM 80 MG PO TABS: 80.0000 mg | ORAL_TABLET | Freq: Every day | ORAL | 1 refills | Status: DC

## 2022-04-26 NOTE — Assessment & Plan Note (Signed)
Preventive care reviewed: Td 2019. PNM 13 2015 PNM 23: 2013, 2023. Shingrix x 2 RSV- had one already [place]- UTD per pt  Had a flu shot 12/24/2020: Colonoscopy, polypoid colonic mucosa, no further colonoscopies Prostate cancer screening: PSA 0.16 June 2021

## 2022-04-26 NOTE — Addendum Note (Signed)
Addended byDamita Dunnings D on: 04/26/2022 01:08 PM   Modules accepted: Orders

## 2022-05-03 ENCOUNTER — Telehealth: Payer: Self-pay | Admitting: Internal Medicine

## 2022-05-03 DIAGNOSIS — M4306 Spondylolysis, lumbar region: Secondary | ICD-10-CM | POA: Diagnosis not present

## 2022-05-03 DIAGNOSIS — M5410 Radiculopathy, site unspecified: Secondary | ICD-10-CM

## 2022-05-03 NOTE — Telephone Encounter (Signed)
Pt would like to see physical therapist for some back pain and called to make an appt - was told he needs a referral. He saw you last week and discussed his back pain.  [facility] and fax is [phone].

## 2022-05-03 NOTE — Telephone Encounter (Signed)
PT referral placed.

## 2022-05-04 ENCOUNTER — Telehealth: Payer: Self-pay

## 2022-05-04 NOTE — Telephone Encounter (Signed)
PT plan of care signed and faxed back to [facility] at [phone]. Form sent for scanning

## 2022-05-12 ENCOUNTER — Ambulatory Visit (INDEPENDENT_AMBULATORY_CARE_PROVIDER_SITE_OTHER): Payer: Medicare Other | Admitting: Orthopaedic Surgery

## 2022-05-12 ENCOUNTER — Encounter: Payer: Self-pay | Admitting: Orthopaedic Surgery

## 2022-05-12 ENCOUNTER — Ambulatory Visit (INDEPENDENT_AMBULATORY_CARE_PROVIDER_SITE_OTHER): Payer: Medicare Other

## 2022-05-12 DIAGNOSIS — G8929 Other chronic pain: Secondary | ICD-10-CM

## 2022-05-12 DIAGNOSIS — M545 Low back pain, unspecified: Secondary | ICD-10-CM | POA: Diagnosis not present

## 2022-05-12 MED ORDER — METHOCARBAMOL 750 MG PO TABS: 750.0000 mg | ORAL_TABLET | Freq: Two times a day (BID) | ORAL | 2 refills | Status: AC | PRN

## 2022-05-12 NOTE — Progress Notes (Signed)
Office Visit Note   Patient: Carl Knapp           Date of Birth: 1946-04-13           MRN: [id number] Visit Date: 05/12/2022              Requested by: Colon Branch, MD [address] [place],   [id number] PCP: Colon Branch, MD   Assessment & Plan: Visit Diagnoses:  1. Chronic left-sided low back pain without sciatica     Plan: Impression is left lower back pain likely from underlying spinal stenosis nerve impingement.  The patient symptoms have improved since starting dry needling recently.  He would however like a refill on Robaxin which have provided.  He will also continue his lumbar stretching.  If his symptoms do not significantly improve or happen to worsen he will let me know and we will make referral to Dr. Ernestina Patches for [facility].  Otherwise follow-up as needed.  Follow-Up Instructions: No follow-ups on file.   Orders:  Orders Placed This Encounter  Procedures   XR Lumbar Spine 2-3 Views   Meds ordered this encounter  Medications   methocarbamol (ROBAXIN-750) 750 MG tablet    Sig: Take 1 tablet (750 mg total) by mouth 2 (two) times daily as needed for muscle spasms.    Dispense:  20 tablet    Refill:  2      Procedures: No procedures performed   Clinical Data: No additional findings.   Subjective: Chief Complaint  Patient presents with   Left Hip - Pain    HPI patient is a pleasant 77 year old gentleman who comes in today with left lower back pain for the past 2 weeks.  He denies any injury or change in activity.  Symptoms he has occur primarily with going from seated to standing position, standing to seated position lumbar flexion and rotation as well as with lying supine.  He has been taking Tylenol with some relief.  He denies any paresthesias to the left lower extremity.  No bowel or bladder change.  He has been seeing a physical therapist where he has been getting dry needling which has helped.  He is unable to take NSAIDs due to kidney  disease.  Of note, he was seen by [place] for right lower back pain where he was referred to Dr. Ernestina Patches for [facility] based on spinal stenosis L2 S1.  He notes this is significantly helped.  Review of Systems as detailed in HPI.  All others reviewed and are negative.   Objective: Vital Signs: There were no vitals taken for this visit.  Physical Exam well-developed well-nourished gentleman  Ortho Exam Exam unchanged Specialty Comments:  MRI LUMBAR SPINE WITHOUT CONTRAST   TECHNIQUE: Multiplanar, multisequence MR imaging of the lumbar spine was performed. No intravenous contrast was administered.   COMPARISON:  Lumbar spine radiograph 09/27/2021   FINDINGS: Segmentation:  Standard.   Alignment:  Physiologic.   Vertebrae: No fracture, evidence of discitis, or aggressive bone lesion.   Conus medullaris and cauda equina: Conus extends to the L1-L2 level. Conus and cauda equina appear normal.   Paraspinal and other soft tissues: Negative   Disc levels:   T12-L1: No significant spinal canal or neural foraminal narrowing.   L1-L2: No significant spinal canal or neural foraminal narrowing.   L2-L3: Mild disc bulging, ligament flavum hypertrophy mild facet arthropathy results in mild spinal canal and bilateral subarticular stenosis. No significant neural foraminal stenosis.  L3-L4: Mild disc bulging, ligament flavum hypertrophy mild facet arthropathy. Mild spinal canal and right greater than left subarticular stenosis, encroaching the descending right L4 nerve root. No significant neural foraminal stenosis.   L4-L5: Broad-based disc bulging, ligament flavum hypertrophy and mild facet arthropathy results in moderate spinal canal stenosis with high-grade subarticular stenosis bilaterally potentially impinging the descending L5 nerve roots. Mild bilateral neural foraminal stenosis.   L5-S1: Minimal disc bulging. There is advanced bilateral facet arthropathy. No spinal canal stenosis.  There is mild left-sided neural foraminal stenosis. No right neural foraminal stenosis.   IMPRESSION: Multilevel degenerative changes of the lumbar spine, worst at L4-L5, where there is spinal canal stenosis with high-grade subarticular stenosis bilaterally, potentially impinging the descending L5 nerve roots. Mild bilateral neural foraminal stenosis at this level.   Mild spinal canal and bilateral subarticular stenosis at L2-L3.   Mild spinal canal and right greater than left subarticular stenosis at L3-L4, encroaching the descending right L4 nerve root.   Mild left-sided neural foraminal stenosis at L5-S1.     Electronically Signed   By: Maurine Simmering M.D.   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   Chronic kidney disease (CKD) stage G3a/A1, moderately decreased glomerular filtration rate (GFR) between 45-59 mL/min/1.73 square meter and albuminuria creatinine ratio less than 30 mg/g (HCC) 09/14/2021   Adjustment disorder 09/14/2021   History of DVT (deep vein thrombosis) 09/14/2021   Atrial tachycardia, paroxysmal 08/10/2021   PVC's (premature ventricular contractions) 08/10/2021   Type 2 diabetes mellitus ([place]) 09/29/2019   CAD (coronary artery disease) 01/20/2014   HLD (hyperlipidemia) 01/02/2014   Obstructive sleep apnea 05/07/2013   Obesity (BMI 30-39.9) 05/07/2013   Essential  hypertension, benign 05/07/2013   Past Medical History:  Diagnosis Date   Allergy    Arthritis    Atrial tachycardia, paroxysmal    Noted to have up to 14 beats in a row of atrial tachycardia on event monitor for/2023   Chronic kidney disease    Coronary artery disease 12/2013   20-30% RCA by cath- states was good.   Diabetes mellitus without complication ([place])    type II   Drug-induced myopathy    DVT (deep venous thrombosis) (HCC)    bilateral legs-greater on left. -3-5 years ago-tx. coumadin x 1 year.   Eczema    Erectile dysfunction    GERD (gastroesophageal reflux disease)    Hx of cardiovascular stress test    ETT-Myoview (7/15):  ECG with ST depression; freq PVCs, + chest pain; normal perfusion   Hyperlipidemia    LDL goal<100   Hypertension    Macular degeneration    mild   OSA (obstructive sleep apnea)    Severe w AHI 37/hr now on CPAP at 10cm H2O   PVC's (premature ventricular contractions)    Isolated PVCs, ventricular couplets and triplets, bigeminal and trigeminal PVCs with PVC load less than 1% on event monitor 08/2021    Family History  Problem Relation Age of Onset   Colon cancer Mother    CAD Father    AAA (abdominal aortic aneurysm) Father    Heart attack Father    Arthritis Father    Lung cancer Father    Hypertension Father    Lung cancer Maternal Grandmother    Stroke Maternal Grandfather 40   Cancer Paternal Grandmother    Heart attack Paternal Grandfather 27   Hypertension Other    Prostate cancer Neg Hx     Past Surgical History:  Procedure Laterality Date   APPENDECTOMY     open '68   HERNIA REPAIR Bilateral 1980   inguinal   INSERTION OF MESH N/A 06/14/2017   Procedure: INSERTION OF MESH;  Surgeon: Alphonsa Overall, MD;  Location: [facility];  Service: General;  Laterality: N/A;   LEFT HEART CATHETERIZATION WITH CORONARY ANGIOGRAM N/A 12/09/2013   Procedure: LEFT HEART CATHETERIZATION WITH CORONARY ANGIOGRAM;  Surgeon: Jettie Booze, MD;   Location: [facility] CATH LAB;  Service: Cardiovascular;  Laterality: N/A;   TONSILLECTOMY     UMBILICAL HERNIA REPAIR N/A 06/14/2017   Procedure: LAPAROSCOPIC UMBILICAL HERNIA REPAIR WITH MESH ERAS PATHWAY;  Surgeon: Alphonsa Overall, MD;  Location: [facility];  Service: General;  Laterality: N/A;  ERAS PATHWAY   VASECTOMY     Social History   Occupational History   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

## 2022-05-16 DIAGNOSIS — M4306 Spondylolysis, lumbar region: Secondary | ICD-10-CM | POA: Diagnosis not present

## 2022-06-06 DIAGNOSIS — E11319 Type 2 diabetes mellitus with unspecified diabetic retinopathy without macular edema: Secondary | ICD-10-CM | POA: Diagnosis not present

## 2022-06-06 LAB — HM DIABETES EYE EXAM

## 2022-07-24 ENCOUNTER — Encounter: Payer: Self-pay | Admitting: Internal Medicine

## 2022-07-24 ENCOUNTER — Ambulatory Visit: Payer: Medicare Other | Admitting: Internal Medicine

## 2022-07-27 ENCOUNTER — Ambulatory Visit (INDEPENDENT_AMBULATORY_CARE_PROVIDER_SITE_OTHER): Payer: Medicare Other | Admitting: Internal Medicine

## 2022-07-27 ENCOUNTER — Encounter: Payer: Self-pay | Admitting: Internal Medicine

## 2022-07-27 VITALS — BP 144/62 | HR 66 | Temp 98.0°F | Resp 18 | Ht 70.0 in | Wt 227.5 lb

## 2022-07-27 DIAGNOSIS — N189 Chronic kidney disease, unspecified: Secondary | ICD-10-CM

## 2022-07-27 DIAGNOSIS — R809 Proteinuria, unspecified: Secondary | ICD-10-CM

## 2022-07-27 DIAGNOSIS — E1169 Type 2 diabetes mellitus with other specified complication: Secondary | ICD-10-CM | POA: Diagnosis not present

## 2022-07-27 DIAGNOSIS — I10 Essential (primary) hypertension: Secondary | ICD-10-CM

## 2022-07-27 DIAGNOSIS — E7800 Pure hypercholesterolemia, unspecified: Secondary | ICD-10-CM

## 2022-07-27 LAB — LIPID PANEL
Cholesterol: 110 mg/dL (ref 0–200)
HDL: 42.9 mg/dL (ref >39.00)
LDL Cholesterol: 35 mg/dL (ref 0–99)
NonHDL: 67.26
Total CHOL/HDL Ratio: 3
Triglycerides: 161 mg/dL — ABNORMAL HIGH (ref 0.0–149.0)
VLDL: 32.2 mg/dL (ref 0.0–40.0)

## 2022-07-27 LAB — MICROALBUMIN / CREATININE URINE RATIO
Creatinine,U: 100.3 mg/dL
Microalb Creat Ratio: 48.6 mg/g — ABNORMAL HIGH (ref 0.0–30.0)
Microalb, Ur: 48.8 mg/dL — ABNORMAL HIGH (ref 0.0–1.9)

## 2022-07-27 LAB — BASIC METABOLIC PANEL
BUN: 24 mg/dL — ABNORMAL HIGH (ref 6–23)
CO2: 28 mEq/L (ref 19–32)
Calcium: 9 mg/dL (ref 8.4–10.5)
Chloride: 107 mEq/L (ref 96–112)
Creatinine, Ser: 1.25 mg/dL (ref 0.40–1.50)
GFR: 56.19 mL/min — ABNORMAL LOW (ref >60.00)
Glucose, Bld: 121 mg/dL — ABNORMAL HIGH (ref 70–99)
Potassium: 4.1 mEq/L (ref 3.5–5.1)
Sodium: 143 mEq/L (ref 135–145)

## 2022-07-27 LAB — HEMOGLOBIN A1C: Hgb A1c MFr Bld: 6.8 % — ABNORMAL HIGH (ref 4.6–6.5)

## 2022-07-27 MED ORDER — TRIAMCINOLONE ACETONIDE 0.1 % EX CREA: 1.0000 | TOPICAL_CREAM | Freq: Two times a day (BID) | CUTANEOUS | 2 refills | Status: DC | PRN

## 2022-07-27 NOTE — Patient Instructions (Addendum)
Check the  blood pressure regularly ?BP GOAL is between 110/65 and  135/85. ?If it is consistently higher or lower, let me know ?  ? ? ?GO TO THE LAB : Get the blood work   ? ? ?GO TO THE FRONT DESK, PLEASE SCHEDULE YOUR APPOINTMENTS ?Come back for  a check up in 4 months  ?

## 2022-07-27 NOTE — Progress Notes (Signed)
Subjective:    Patient ID: Carl Knapp, male    DOB: Dec 15, 1945, 77 y.o.   MRN: [id number]  DOS:  07/27/2022 Type of visit - description: f/u  Today we talk about his chronic medical problems. Good med compliance except Jardiance, unable to take 1 whole tablet every day.  Ambulatory CBGs are very good. He remains active Denies chest pain or difficulty breathing  BP Readings from Last 3 Encounters:  07/27/22 (!) 144/62  04/24/22 138/62  12/21/21 138/70    Review of Systems See above   Past Medical History:  Diagnosis Date   Allergy    Arthritis    Atrial tachycardia, paroxysmal    Noted to have up to 14 beats in a row of atrial tachycardia on event monitor for/2023   Chronic kidney disease    Coronary artery disease 12/2013   20-30% RCA by cath- states was good.   Diabetes mellitus without complication ([place])    type II   Drug-induced myopathy    DVT (deep venous thrombosis) (HCC)    bilateral legs-greater on left. -3-5 years ago-tx. coumadin x 1 year.   Eczema    Erectile dysfunction    GERD (gastroesophageal reflux disease)    Hx of cardiovascular stress test    ETT-Myoview (7/15):  ECG with ST depression; freq PVCs, + chest pain; normal perfusion   Hyperlipidemia    LDL goal<100   Hypertension    Macular degeneration    mild   OSA (obstructive sleep apnea)    Severe w AHI 37/hr now on CPAP at 10cm H2O   PVC's (premature ventricular contractions)    Isolated PVCs, ventricular couplets and triplets, bigeminal and trigeminal PVCs with PVC load less than 1% on event monitor 08/2021    Past Surgical History:  Procedure Laterality Date   APPENDECTOMY     open '68   HERNIA REPAIR Bilateral 1980   inguinal   INSERTION OF MESH N/A 06/14/2017   Procedure: INSERTION OF MESH;  Surgeon: Alphonsa Overall, MD;  Location: [facility];  Service: General;  Laterality: N/A;   LEFT HEART CATHETERIZATION WITH CORONARY ANGIOGRAM N/A 12/09/2013   Procedure: LEFT HEART CATHETERIZATION  WITH CORONARY ANGIOGRAM;  Surgeon: Jettie Booze, MD;  Location: [facility] CATH LAB;  Service: Cardiovascular;  Laterality: N/A;   TONSILLECTOMY     UMBILICAL HERNIA REPAIR N/A 06/14/2017   Procedure: LAPAROSCOPIC UMBILICAL HERNIA REPAIR WITH MESH ERAS PATHWAY;  Surgeon: Alphonsa Overall, MD;  Location: [facility];  Service: General;  Laterality: N/A;  ERAS PATHWAY   VASECTOMY      Current Outpatient Medications  Medication Instructions   acetaminophen (TYLENOL) 1,000 mg, Oral, Daily PRN   amLODipine (NORVASC) 10 mg, Oral, Daily   aspirin EC 81 mg, Daily   atorvastatin (LIPITOR) 80 mg, Oral, Daily at bedtime   empagliflozin (JARDIANCE) 10 mg, Oral, Daily before breakfast   ketoconazole (NIZORAL) 2 % cream 1 application , Topical, 2 times daily PRN   loratadine (CLARITIN) 10 mg, Oral, Daily PRN   losartan (COZAAR) 100 mg, Oral, Daily   metFORMIN (GLUCOPHAGE) 1,000 mg, Oral, 2 times daily with meals   methocarbamol (ROBAXIN-750) 750 mg, Oral, 2 times daily PRN   Multiple Vitamins-Minerals (PRESERVISION AREDS 2) CAPS 1 capsule, Oral, 2 times daily   multivitamin (ONE-A-DAY MEN'S) TABS tablet 1 tablet, Oral, Daily   Omega-3 Fatty Acids (FISH OIL) 1000 MG CAPS 1 capsule, Oral, 2 times daily   pantoprazole (PROTONIX) 40 mg, Oral, Daily before breakfast  pioglitazone (ACTOS) 45 mg, Oral, Every morning   tadalafil (CIALIS) 10-20 mg, Oral, Every 48 hours PRN   triamcinolone cream (KENALOG) 0.1 % 1 Application, Topical, 2 times daily PRN       Objective:   Physical Exam BP (!) 144/62   Pulse 66   Temp 98 F (36.7 C) (Oral)   Resp 18   Ht 5\' 10"  (1.778 m)   Wt 227 lb 8 oz (103.2 kg)   SpO2 93%   BMI 32.64 kg/m  General:   Well developed, NAD, BMI noted. HEENT:  Normocephalic . Face symmetric, atraumatic Lungs:  CTA B Normal respiratory effort, no intercostal retractions, no accessory muscle use. Heart: RRR,  no murmur.  Lower extremities: no pretibial edema bilaterally  Skin: Not  pale. Not jaundice Neurologic:  alert & oriented X3.  Speech normal, gait appropriate for age and unassisted Psych--  Cognition and judgment appear intact.  Cooperative with normal attention span and concentration.  Behavior appropriate. No anxious or depressed appearing.      Assessment     Assessment  (new patient 09/2021, previous PCP retiring) DM dx ~ 2010 HTN High cholesterol CKD CV: - CAD, dr turner  - TIA (?)  June 2023 GERD (remotely) Supraventricular tachycardia OSA, Cpap, Dr Radford Pax  Macular degeneration H/o DVT  PLAN DM: Last A1c improved, recommended to increase dose of Jardiance from half tablet to 1 tablet daily however mostly takes half tablet d/t s/e w/  full dose (nausea and diarrhea).  Denies any genital rash.  He remains very active, swims 3 times a week. Plan: Continue Jardiance 5 mg daily, metformin, pioglitazone.  Checking A1c and micro. High cholesterol: Last LDL 82, atorvastatin increased to 80 mg daily.  Check FLP HTN: BP slightly higher than usual today.  Typically is okay.  On amlodipine, losartan.  Check labs Rash, allergies: Has random rashes seasonally, uses Kenalog sporadically.  Request a refill.  Sent RTC 4 months

## 2022-07-27 NOTE — Assessment & Plan Note (Signed)
DM: Last A1c improved, recommended to increase dose of Jardiance from half tablet to 1 tablet daily however mostly takes half tablet d/t s/e w/  full dose (nausea and diarrhea).  Denies any genital rash.  He remains very active, swims 3 times a week. Plan: Continue Jardiance 5 mg daily, metformin, pioglitazone.  Checking A1c and micro. High cholesterol: Last LDL 82, atorvastatin increased to 80 mg daily.  Check FLP HTN: BP slightly higher than usual today.  Typically is okay.  On amlodipine, losartan.  Check labs Rash, allergies: Has random rashes seasonally, uses Kenalog sporadically.  Request a refill.  Sent RTC 4 months

## 2022-08-01 NOTE — Addendum Note (Signed)
Addended byDamita Dunnings D on: 08/01/2022 08:47 AM   Modules accepted: Orders

## 2022-08-05 ENCOUNTER — Other Ambulatory Visit: Payer: Self-pay | Admitting: Internal Medicine

## 2022-08-05 ENCOUNTER — Other Ambulatory Visit: Payer: Self-pay | Admitting: Cardiology

## 2022-08-10 ENCOUNTER — Other Ambulatory Visit: Payer: Self-pay | Admitting: Internal Medicine

## 2022-09-01 ENCOUNTER — Other Ambulatory Visit: Payer: Self-pay | Admitting: Internal Medicine

## 2022-09-01 MED ORDER — ATORVASTATIN CALCIUM 80 MG PO TABS: 80.0000 mg | ORAL_TABLET | Freq: Every day | ORAL | 1 refills | Status: DC

## 2022-09-12 ENCOUNTER — Telehealth: Payer: Self-pay | Admitting: Internal Medicine

## 2022-09-12 NOTE — Telephone Encounter (Signed)
Contacted Carl Knapp to schedule their annual wellness visit. Appointment made for 10/11/2022.  Verlee Rossetti; Care Guide Ambulatory Clinical Support [place] [facility] Direct Dial: [phone]

## 2022-09-17 ENCOUNTER — Other Ambulatory Visit: Payer: Self-pay | Admitting: Internal Medicine

## 2022-09-22 DIAGNOSIS — R809 Proteinuria, unspecified: Secondary | ICD-10-CM | POA: Diagnosis not present

## 2022-09-22 DIAGNOSIS — I129 Hypertensive chronic kidney disease with stage 1 through stage 4 chronic kidney disease, or unspecified chronic kidney disease: Secondary | ICD-10-CM | POA: Diagnosis not present

## 2022-09-22 DIAGNOSIS — E785 Hyperlipidemia, unspecified: Secondary | ICD-10-CM | POA: Diagnosis not present

## 2022-09-22 DIAGNOSIS — E1122 Type 2 diabetes mellitus with diabetic chronic kidney disease: Secondary | ICD-10-CM | POA: Diagnosis not present

## 2022-09-22 DIAGNOSIS — N1831 Chronic kidney disease, stage 3a: Secondary | ICD-10-CM | POA: Diagnosis not present

## 2022-09-22 LAB — CBC: RBC: 4.49 (ref 3.87–5.11)

## 2022-09-22 LAB — CBC AND DIFFERENTIAL
HCT: 41 (ref 41–53)
Hemoglobin: 13.9 (ref 13.5–17.5)
Neutrophils Absolute: 6.4
WBC: 9.1

## 2022-09-22 LAB — VITAMIN D 25 HYDROXY (VIT D DEFICIENCY, FRACTURES): Vit D, 25-Hydroxy: 25

## 2022-09-22 LAB — COMPREHENSIVE METABOLIC PANEL
Albumin: 4.5 (ref 3.5–5.0)
Calcium: 9.8 (ref 8.7–10.7)
eGFR: 69

## 2022-09-22 LAB — BASIC METABOLIC PANEL
BUN: 21 (ref 4–21)
CO2: 26 — AB (ref 13–22)
Chloride: 103 (ref 99–108)
Creatinine: 1.1 (ref 0.6–1.3)
Glucose: 80
Potassium: 4.1 mEq/L (ref 3.5–5.1)
Sodium: 140 (ref 137–147)

## 2022-09-22 LAB — PROTEIN / CREATININE RATIO, URINE: Creatinine, Urine: 25.8

## 2022-09-22 LAB — HM HEPATITIS C SCREENING LAB: HM Hepatitis Screen: NEGATIVE

## 2022-09-22 LAB — HM HIV SCREENING LAB: HM HIV Screening: NEGATIVE

## 2022-09-26 ENCOUNTER — Other Ambulatory Visit: Payer: Self-pay | Admitting: Nephrology

## 2022-09-26 DIAGNOSIS — N1831 Chronic kidney disease, stage 3a: Secondary | ICD-10-CM

## 2022-10-05 ENCOUNTER — Encounter: Payer: Self-pay | Admitting: Internal Medicine

## 2022-10-11 ENCOUNTER — Ambulatory Visit (INDEPENDENT_AMBULATORY_CARE_PROVIDER_SITE_OTHER): Payer: Medicare Other | Admitting: *Deleted

## 2022-10-11 DIAGNOSIS — Z0000 Encounter for general adult medical examination without abnormal findings: Secondary | ICD-10-CM

## 2022-10-11 NOTE — Patient Instructions (Signed)
Carl Knapp , Thank you for taking time to come for your Medicare Wellness Visit. I appreciate your ongoing commitment to your health goals. Please review the following plan we discussed and let me know if I can assist you in the future.   These are the goals we discussed:  Goals   None     This is a list of the screening recommended for you and due dates:  Health Maintenance  Topic Date Due   COVID-19 Vaccine (6 - 2023-24 season) 01/06/2022   Flu Shot  12/07/2022   Complete foot exam   12/22/2022   Hemoglobin A1C  01/27/2023   Eye exam for diabetics  06/07/2023   Yearly kidney function blood test for diabetes  09/22/2023   Yearly kidney health urinalysis for diabetes  09/22/2023   Medicare Annual Wellness Visit  10/11/2023   DTaP/Tdap/Td vaccine (3 - Td or Tdap) 01/15/2028   Pneumonia Vaccine  Completed   Hepatitis C Screening  Completed   Zoster (Shingles) Vaccine  Completed   HPV Vaccine  Aged Out   Colon Cancer Screening  Discontinued     Next appointment: Follow up in one year for your annual wellness visit.   Preventive Care 8 Years and Older, Male Preventive care refers to lifestyle choices and visits with your health care provider that can promote health and wellness. What does preventive care include? A yearly physical exam. This is also called an annual well check. Dental exams once or twice a year. Routine eye exams. Ask your health care provider how often you should have your eyes checked. Personal lifestyle choices, including: Daily care of your teeth and gums. Regular physical activity. Eating a healthy diet. Avoiding tobacco and drug use. Limiting alcohol use. Practicing safe sex. Taking low doses of aspirin every day. Taking vitamin and mineral supplements as recommended by your health care provider. What happens during an annual well check? The services and screenings done by your health care provider during your annual well check will depend on your age,  overall health, lifestyle risk factors, and family history of disease. Counseling  Your health care provider may ask you questions about your: Alcohol use. Tobacco use. Drug use. Emotional well-being. Home and relationship well-being. Sexual activity. Eating habits. History of falls. Memory and ability to understand (cognition). Work and work Astronomer. Screening  You may have the following tests or measurements: Height, weight, and BMI. Blood pressure. Lipid and cholesterol levels. These may be checked every 5 years, or more frequently if you are over 41 years old. Skin check. Lung cancer screening. You may have this screening every year starting at age 52 if you have a 30-pack-year history of smoking and currently smoke or have quit within the past 15 years. Fecal occult blood test (FOBT) of the stool. You may have this test every year starting at age 41. Flexible sigmoidoscopy or colonoscopy. You may have a sigmoidoscopy every 5 years or a colonoscopy every 10 years starting at age 11. Prostate cancer screening. Recommendations will vary depending on your family history and other risks. Hepatitis C blood test. Hepatitis B blood test. Sexually transmitted disease (STD) testing. Diabetes screening. This is done by checking your blood sugar (glucose) after you have not eaten for a while (fasting). You may have this done every 1-3 years. Abdominal aortic aneurysm (AAA) screening. You may need this if you are a current or former smoker. Osteoporosis. You may be screened starting at age 25 if you are at high  risk. Talk with your health care provider about your test results, treatment options, and if necessary, the need for more tests. Vaccines  Your health care provider may recommend certain vaccines, such as: Influenza vaccine. This is recommended every year. Tetanus, diphtheria, and acellular pertussis (Tdap, Td) vaccine. You may need a Td booster every 10 years. Zoster vaccine.  You may need this after age 59. Pneumococcal 13-valent conjugate (PCV13) vaccine. One dose is recommended after age 68. Pneumococcal polysaccharide (PPSV23) vaccine. One dose is recommended after age 39. Talk to your health care provider about which screenings and vaccines you need and how often you need them. This information is not intended to replace advice given to you by your health care provider. Make sure you discuss any questions you have with your health care provider. Document Released: 05/21/2015 Document Revised: 01/12/2016 Document Reviewed: 02/23/2015 Elsevier Interactive Patient Education  2017 [employer].  Fall Prevention in the Home Falls can cause injuries. They can happen to people of all ages. There are many things you can do to make your home safe and to help prevent falls. What can I do on the outside of my home? Regularly fix the edges of walkways and driveways and fix any cracks. Remove anything that might make you trip as you walk through a door, such as a raised step or threshold. Trim any bushes or trees on the path to your home. Use bright outdoor lighting. Clear any walking paths of anything that might make someone trip, such as rocks or tools. Regularly check to see if handrails are loose or broken. Make sure that both sides of any steps have handrails. Any raised decks and porches should have guardrails on the edges. Have any leaves, snow, or ice cleared regularly. Use sand or salt on walking paths during winter. Clean up any spills in your garage right away. This includes oil or grease spills. What can I do in the bathroom? Use night lights. Install grab bars by the toilet and in the tub and shower. Do not use towel bars as grab bars. Use non-skid mats or decals in the tub or shower. If you need to sit down in the shower, use a plastic, non-slip stool. Keep the floor dry. Clean up any water that spills on the floor as soon as it happens. Remove soap  buildup in the tub or shower regularly. Attach bath mats securely with double-sided non-slip rug tape. Do not have throw rugs and other things on the floor that can make you trip. What can I do in the bedroom? Use night lights. Make sure that you have a light by your bed that is easy to reach. Do not use any sheets or blankets that are too big for your bed. They should not hang down onto the floor. Have a firm chair that has side arms. You can use this for support while you get dressed. Do not have throw rugs and other things on the floor that can make you trip. What can I do in the kitchen? Clean up any spills right away. Avoid walking on wet floors. Keep items that you use a lot in easy-to-reach places. If you need to reach something above you, use a strong step stool that has a grab bar. Keep electrical cords out of the way. Do not use floor polish or wax that makes floors slippery. If you must use wax, use non-skid floor wax. Do not have throw rugs and other things on the floor that can  make you trip. What can I do with my stairs? Do not leave any items on the stairs. Make sure that there are handrails on both sides of the stairs and use them. Fix handrails that are broken or loose. Make sure that handrails are as long as the stairways. Check any carpeting to make sure that it is firmly attached to the stairs. Fix any carpet that is loose or worn. Avoid having throw rugs at the top or bottom of the stairs. If you do have throw rugs, attach them to the floor with carpet tape. Make sure that you have a light switch at the top of the stairs and the bottom of the stairs. If you do not have them, ask someone to add them for you. What else can I do to help prevent falls? Wear shoes that: Do not have high heels. Have rubber bottoms. Are comfortable and fit you well. Are closed at the toe. Do not wear sandals. If you use a stepladder: Make sure that it is fully opened. Do not climb a closed  stepladder. Make sure that both sides of the stepladder are locked into place. Ask someone to hold it for you, if possible. Clearly mark and make sure that you can see: Any grab bars or handrails. First and last steps. Where the edge of each step is. Use tools that help you move around (mobility aids) if they are needed. These include: Canes. Walkers. Scooters. Crutches. Turn on the lights when you go into a dark area. Replace any light bulbs as soon as they burn out. Set up your furniture so you have a clear path. Avoid moving your furniture around. If any of your floors are uneven, fix them. If there are any pets around you, be aware of where they are. Review your medicines with your doctor. Some medicines can make you feel dizzy. This can increase your chance of falling. Ask your doctor what other things that you can do to help prevent falls. This information is not intended to replace advice given to you by your health care provider. Make sure you discuss any questions you have with your health care provider. Document Released: 02/18/2009 Document Revised: 09/30/2015 Document Reviewed: 05/29/2014 Elsevier Interactive Patient Education  2017 [employer].

## 2022-10-11 NOTE — Progress Notes (Addendum)
Subjective:   Carl Knapp is a 77 y.o. male who presents for Medicare Annual/Subsequent preventive examination.  I connected with  [name] on 10/11/22 by a audio enabled telemedicine application and verified that I am speaking with the correct person using two identifiers.  Patient Location: Home  Provider Location: Office/Clinic  I discussed the limitations of evaluation and management by telemedicine. The patient expressed understanding and agreed to proceed.   Review of Systems     Cardiac Risk Factors include: advanced age (>40men, >36 women);male gender;obesity (BMI >30kg/m2);diabetes mellitus;dyslipidemia;hypertension     Objective:    Today's Vitals   There is no height or weight on file to calculate BMI.     10/11/2022    8:20 AM 10/11/2021    9:37 PM 09/27/2021   11:03 AM 06/14/2017    6:58 AM 06/11/2017    8:35 AM 06/08/2015    8:45 AM 05/20/2015    3:08 PM  Advanced Directives  Does Patient Have a Medical Advance Directive? Yes No Yes Yes Yes Yes Yes  Type of Estate agent of [place];Living will  Healthcare Power of [place];Out of facility DNR (pink MOST or yellow form);Living will Healthcare Power of [facility];Living will Healthcare Power of [place];Living will Living will;Healthcare Power of Attorney   Does patient want to make changes to medical advance directive? No - Patient declined  No - Patient declined No - Patient declined No - Patient declined No - Patient declined No - Patient declined  Copy of Healthcare Power of Attorney in Chart? Yes - validated most recent copy scanned in chart (See row information)  Yes - validated most recent copy scanned in chart (See row information) Yes Yes Yes     Current Medications (verified) Outpatient Encounter Medications as of 10/11/2022  Medication Sig   acetaminophen (TYLENOL) 500 MG tablet Take 1,000 mg by mouth daily as needed for moderate pain or headache.   amLODipine (NORVASC) 10 MG tablet  Take 1 tablet (10 mg total) by mouth daily.   aspirin EC 81 MG tablet Take 81 mg by mouth daily.   atorvastatin (LIPITOR) 80 MG tablet Take 1 tablet (80 mg total) by mouth at bedtime.   empagliflozin (JARDIANCE) 10 MG TABS tablet Take 1 tablet (10 mg total) by mouth daily before breakfast.   ketoconazole (NIZORAL) 2 % cream Apply 1 application. topically 2 (two) times daily as needed for irritation (eczema).   loratadine (CLARITIN) 10 MG tablet Take 10 mg by mouth daily as needed for allergies.   losartan (COZAAR) 100 MG tablet TAKE 1 TABLET BY MOUTH EVERY DAY   metFORMIN (GLUCOPHAGE) 1000 MG tablet Take 1 tablet (1,000 mg total) by mouth 2 (two) times daily with a meal.   methocarbamol (ROBAXIN-750) 750 MG tablet Take 1 tablet (750 mg total) by mouth 2 (two) times daily as needed for muscle spasms.   Multiple Vitamins-Minerals (PRESERVISION AREDS 2) CAPS Take 1 capsule by mouth in the morning and at bedtime.   multivitamin (ONE-A-DAY MEN'S) TABS tablet Take 1 tablet by mouth daily.   Omega-3 Fatty Acids (FISH OIL) 1000 MG CAPS Take 1 capsule by mouth in the morning and at bedtime.   pioglitazone (ACTOS) 45 MG tablet Take 1 tablet (45 mg total) by mouth every morning.   tadalafil (CIALIS) 20 MG tablet Take 0.5-1 tablets (10-20 mg total) by mouth every other day as needed for erectile dysfunction.   triamcinolone cream (KENALOG) 0.1 % Apply 1 Application topically 2 (  two) times daily as needed (eczema).   No facility-administered encounter medications on file as of 10/11/2022.    Allergies (verified) Ozempic (0.25 or 0.5 mg-dose) [semaglutide(0.25 or 0.5mg -dos)] and Simvastatin   History: Past Medical History:  Diagnosis Date   Allergy    Anxiety    Arthritis    Atrial tachycardia, paroxysmal    Noted to have up to 14 beats in a row of atrial tachycardia on event monitor for/2023   Chronic kidney disease    Coronary artery disease 12/2013   20-30% RCA by cath- states was good.    Diabetes mellitus without complication (HCC)    type II   Drug-induced myopathy    DVT (deep venous thrombosis) (HCC)    bilateral legs-greater on left. -3-5 years ago-tx. coumadin x 1 year.   Eczema    Erectile dysfunction    GERD (gastroesophageal reflux disease)    Hx of cardiovascular stress test    ETT-Myoview (7/15):  ECG with ST depression; freq PVCs, + chest pain; normal perfusion   Hyperlipidemia    LDL goal<100   Hypertension    Macular degeneration    mild   OSA (obstructive sleep apnea)    Severe w AHI 37/hr now on CPAP at 10cm H2O   PVC's (premature ventricular contractions)    Isolated PVCs, ventricular couplets and triplets, bigeminal and trigeminal PVCs with PVC load less than 1% on event monitor 08/2021   Sleep apnea Use CPAP   Past Surgical History:  Procedure Laterality Date   APPENDECTOMY     open '68   HERNIA REPAIR Bilateral 1980   inguinal   INSERTION OF MESH N/A 06/14/2017   Procedure: INSERTION OF MESH;  Surgeon: Ovidio Kin, MD;  Location: [facility];  Service: General;  Laterality: N/A;   LEFT HEART CATHETERIZATION WITH CORONARY ANGIOGRAM N/A 12/09/2013   Procedure: LEFT HEART CATHETERIZATION WITH CORONARY ANGIOGRAM;  Surgeon: Corky Crafts, MD;  [facility] C[facility] LAB;  Service: Cardiovascular;  Laterality: N/A;   TONSILLECTOMY     UMBILICAL HERNIA REPAIR N/A 06/14/2017   Procedure: LAPAROSCOPIC UMBILICAL HERNIA REPAIR WITH MESH ERAS PATHWAY;  Surgeon: Ovidio Kin, MD;  Location: [facility];  Service: General;  Laterality: N/A;  ERAS PATHWAY   VASECTOMY     Family History  Problem Relation Age of Onset   Colon cancer Mother    CAD Father    AAA (abdominal aortic aneurysm) Father    Heart attack Father    Arthritis Father    Lung cancer Father    Hypertension Father    Lung cancer Maternal Grandmother    Stroke Maternal Grandfather 16   Cancer Paternal Grandmother    Heart attack Paternal Grandfather 32   Hypertension Other    Prostate  cancer Neg Hx    Social History   Socioeconomic History   Marital status: Married    Spouse name: Not on file   Number of children: 3   Years of education: Not on file   Highest education level: Not on file  Occupational History   Occupation: retired- Primary school teacher  Tobacco Use   Smoking status: Former    Packs/day: 0.50    Years: 20.00    Additional pack years: 0.00    Total pack years: 10.00    Types: Cigarettes    Quit date: 06/08/2010    Years since quitting: 12.3   Smokeless tobacco: Never   Tobacco comments:    < 1/2   Vaping Use  Vaping Use: Never used  Substance and Sexual Activity   Alcohol use: Yes    Alcohol/week: 2.0 standard drinks of alcohol    Types: 1 Glasses of wine, 1 Cans of beer per week    Comment: rare beer or glass of wine   Drug use: No   Sexual activity: Yes  Other Topics Concern   Not on file  Social History Narrative   Not on file   Social Determinants of Health   Financial Resource Strain: Low Risk  (10/11/2022)   Overall Financial Resource Strain (CARDIA)    Difficulty of Paying Living Expenses: Not hard at all  Food Insecurity: No Food Insecurity (10/11/2022)   Hunger Vital Sign    Worried About Running Out of Food in the Last Year: Never true    Ran Out of Food in the Last Year: Never true  Transportation Needs: No Transportation Needs (10/11/2022)   PRAPARE - Administrator, Civil Service (Medical): No    Lack of Transportation (Non-Medical): No  Physical Activity: Sufficiently Active (10/11/2022)   Exercise Vital Sign    Days of Exercise per Week: 7 days    Minutes of Exercise per Session: 40 min  Stress: Stress Concern Present (10/11/2022)   [employer] of Occupational Health - Occupational Stress Questionnaire    Feeling of Stress : To some extent  Social Connections: Unknown (10/11/2022)   Social Connection and Isolation Panel [NHANES]    Frequency of Communication with Friends and Family: More than three times a  week    Frequency of Social Gatherings with Friends and Family: Once a week    Attends Religious Services: Not on Marketing executive or Organizations: Yes    Attends Engineer, structural: More than 4 times per year    Marital Status: Married    Tobacco Counseling Counseling given: Not Answered Tobacco comments: < 1/2    Clinical Intake:  Pre-visit preparation completed: Yes  Pain : No/denies pain  Nutritional Risks: None Diabetes: Yes CBG done?: No Did pt. bring in CBG monitor from home?: No  How often do you need to have someone help you when you read instructions, pamphlets, or other written materials from your doctor or pharmacy?: 2 - Rarely   Activities of Daily Living    10/11/2022    7:45 AM  In your present state of health, do you have any difficulty performing the following activities:  Hearing? 1  Comment wears hearing aids  Vision? 0  Difficulty concentrating or making decisions? 0  Walking or climbing stairs? 0  Dressing or bathing? 0  Doing errands, shopping? 0  Preparing Food and eating ? N  Using the Toilet? N  In the past six months, have you accidently leaked urine? N  Do you have problems with loss of bowel control? N  Managing your Medications? N  Managing your Finances? N  Housekeeping or managing your Housekeeping? N    Patient Care Team: Wanda Plump, MD as PCP - General (Internal Medicine) Quintella Reichert, MD as PCP - Cardiology (Cardiology) Bernette Redbird, MD as Consulting Physician (Gastroenterology) [place], Heather, OD (Optometry)  Indicate any recent Medical Services you may have received from other than [facility] providers in the past year (date may be approximate).     Assessment:   This is a routine wellness examination for Kiaan.  Hearing/Vision screen No results found.  Dietary issues and exercise activities discussed: Current Exercise Habits: Home  exercise routine;Structured exercise class (swim class), Type  of exercise: walking;Other - see comments (stationary bike, swim, resistance machines), Time (Minutes): 40, Frequency (Times/Week): 7, Weekly Exercise (Minutes/Week): 280, Intensity: Moderate, Exercise limited by: None identified   Goals Addressed   None    Depression Screen    10/11/2022    8:23 AM 07/27/2022   10:10 AM 04/24/2022    8:12 AM 12/21/2021    2:42 PM 10/18/2021    2:32 PM 09/27/2021   11:05 AM 09/21/2021    8:31 AM  PHQ 2/9 Scores  PHQ - 2 Score 0 0 0 0 0 0 0    Fall Risk    10/11/2022    7:45 AM 07/27/2022   10:10 AM 04/24/2022    8:12 AM 12/21/2021    2:41 PM 10/18/2021    2:32 PM  Fall Risk   Falls in the past year? 0 0 0 0 0  Number falls in past yr: 0 0 0 0 0  Injury with Fall? 0 0 0 0 0  Risk for fall due to : No Fall Risks      Follow up Falls evaluation completed Falls evaluation completed Falls evaluation completed Falls evaluation completed     FALL RISK PREVENTION PERTAINING TO THE HOME:  Any stairs in or around the home? No  Home free of loose throw rugs in walkways, pet beds, electrical cords, etc? Yes  Adequate lighting in your home to reduce risk of falls? Yes   ASSISTIVE DEVICES UTILIZED TO PREVENT FALLS:  Life alert? No  Use of a cane, walker or w/c? No  Grab bars in the bathroom? Yes  Shower chair or bench in shower? No  Elevated toilet seat or a handicapped toilet?  Comfort height  TIMED UP AND GO:  Was the test performed?  No, audio visit .    Cognitive Function:        10/11/2022    8:27 AM 09/27/2021   11:11 AM  6CIT Screen  What Year? 0 points 0 points  What month? 0 points 0 points  What time? 0 points 0 points  Count back from 20 0 points 0 points  Months in reverse 0 points 0 points  Repeat phrase 0 points 0 points  Total Score 0 points 0 points    Immunizations Immunization History  Administered Date(s) Administered   Influenza-Unspecified 01/26/2022   PFIZER(Purple Top)SARS-COV-2 Vaccination 06/15/2019, 07/09/2019,  03/20/2020, 08/18/2020   Pfizer Covid-19 Vaccine Bivalent Booster 57yrs & up 02/21/2021   Pneumococcal Conjugate-13 04/14/2014   Pneumococcal Polysaccharide-23 10/05/2011, 06/27/2021   Td 01/14/2018   Tdap 05/14/2007   Zoster Recombinat (Shingrix) 05/08/2019, 09/16/2019   Zoster, Live 02/20/2012    TDAP status: Up to date  Flu Vaccine status: Up to date  Pneumococcal vaccine status: Up to date  Covid-19 vaccine status: Information provided on how to obtain vaccines.   Qualifies for Shingles Vaccine? Yes   Zostavax completed Yes   Shingrix Completed?: Yes  Screening Tests Health Maintenance  Topic Date Due   COVID-19 Vaccine (6 - 2023-24 season) 01/06/2022   INFLUENZA VACCINE  12/07/2022   FOOT EXAM  12/22/2022   HEMOGLOBIN A1C  01/27/2023   OPHTHALMOLOGY EXAM  06/07/2023   Diabetic kidney evaluation - eGFR measurement  09/22/2023   Diabetic kidney evaluation - Urine ACR  09/22/2023   Medicare Annual Wellness (AWV)  10/11/2023   DTaP/Tdap/Td (3 - Td or Tdap) 01/15/2028   Pneumonia Vaccine 71+ Years old  Completed  Hepatitis C Screening  Completed   Zoster Vaccines- Shingrix  Completed   HPV VACCINES  Aged Out   Colonoscopy  Discontinued    Health Maintenance  Health Maintenance Due  Topic Date Due   COVID-19 Vaccine (6 - 2023-24 season) 01/06/2022    Colorectal cancer screening: Type of screening: Colonoscopy. Completed 12/24/20. Repeat every N/a years  Lung Cancer Screening: (Low Dose CT Chest recommended if Age 21-80 years, 30 pack-year currently smoking OR have quit w/in 15years.) does not qualify.    Additional Screening:  Hepatitis C Screening: does qualify; Completed 09/22/22  Vision Screening: Recommended annual ophthalmology exams for early detection of glaucoma and other disorders of the eye. Is the patient up to date with their annual eye exam?  Yes  Who is the provider or what is the name of the office in which the patient attends annual eye exams?  [facility] If pt is not established with a provider, would they like to be referred to a provider to establish care? No .   Dental Screening: Recommended annual dental exams for proper oral hygiene  Community Resource Referral / Chronic Care Management: CRR required this visit?  No   CCM required this visit?  No      Plan:     I have personally reviewed and noted the following in the patient's chart:   Medical and social history Use of alcohol, tobacco or illicit drugs  Current medications and supplements including opioid prescriptions. Patient is not currently taking opioid prescriptions. Functional ability and status Nutritional status Physical activity Advanced directives List of other physicians Hospitalizations, surgeries, and ER visits in previous 12 months Vitals Screenings to include cognitive, depression, and falls Referrals and appointments  In addition, I have reviewed and discussed with patient certain preventive protocols, quality metrics, and best practice recommendations. A written personalized care plan for preventive services as well as general preventive health recommendations were provided to patient.   Due to this being a telephonic visit, the after visit summary with patients personalized plan was offered to patient via mail or my-chart. Patient would like to access on my-chart.   Donne Anon, [place]   10/11/2022   Nurse Notes: None

## 2022-10-12 DIAGNOSIS — I129 Hypertensive chronic kidney disease with stage 1 through stage 4 chronic kidney disease, or unspecified chronic kidney disease: Secondary | ICD-10-CM | POA: Diagnosis not present

## 2022-10-20 ENCOUNTER — Other Ambulatory Visit: Payer: Medicare Other

## 2022-10-20 ENCOUNTER — Ambulatory Visit: Payer: Medicare Other | Attending: Cardiology | Admitting: Cardiology

## 2022-10-20 ENCOUNTER — Encounter: Payer: Self-pay | Admitting: Cardiology

## 2022-10-20 ENCOUNTER — Ambulatory Visit
Admission: RE | Admit: 2022-10-20 | Discharge: 2022-10-20 | Disposition: A | Discharge status: Home / Self Care | Payer: Medicare Other | Source: Ambulatory Visit | Attending: Nephrology | Admitting: Nephrology

## 2022-10-20 VITALS — BP 142/78 | HR 67 | Ht 70.0 in | Wt 220.0 lb

## 2022-10-20 DIAGNOSIS — G4733 Obstructive sleep apnea (adult) (pediatric): Secondary | ICD-10-CM | POA: Insufficient documentation

## 2022-10-20 DIAGNOSIS — E7800 Pure hypercholesterolemia, unspecified: Secondary | ICD-10-CM | POA: Insufficient documentation

## 2022-10-20 DIAGNOSIS — R079 Chest pain, unspecified: Secondary | ICD-10-CM | POA: Diagnosis not present

## 2022-10-20 DIAGNOSIS — N189 Chronic kidney disease, unspecified: Secondary | ICD-10-CM | POA: Diagnosis not present

## 2022-10-20 DIAGNOSIS — I10 Essential (primary) hypertension: Secondary | ICD-10-CM | POA: Diagnosis not present

## 2022-10-20 DIAGNOSIS — I2510 Atherosclerotic heart disease of native coronary artery without angina pectoris: Secondary | ICD-10-CM | POA: Diagnosis not present

## 2022-10-20 DIAGNOSIS — N1831 Chronic kidney disease, stage 3a: Secondary | ICD-10-CM

## 2022-10-20 NOTE — Patient Instructions (Addendum)
Medication Instructions:  Your physician recommends that you continue on your current medications as directed. Please refer to the Current Medication list given to you today.  *If you need a refill on your cardiac medications before your next appointment, please call your pharmacy*  Lab Work: None ordered today.  Testing/Procedures: Your physician has requested you have a cardiac stress PET CT scan performed. Cardiac computed tomography (CT) is a painless test that uses an x-ray machine to take clear, detailed pictures of your heart. For further information please visit [email or web address] Please follow instruction sheet as given.    Follow-Up: At [facility], you and your health needs are our priority.  As part of our continuing mission to provide you with exceptional heart care, we have created designated Provider Care Teams.  These Care Teams include your primary Cardiologist (physician) and Advanced Practice Providers (APPs -  Physician Assistants and Nurse Practitioners) who all work together to provide you with the care you need, when you need it.  Your next appointment:   1 year(s)  The format for your next appointment:   In Person  Provider:   Armanda Magic, MD {  Other Instructions How to Prepare for Your Cardiac PET/CT Stress Test:  1. Please do not take these medications before your test:   Medications that may interfere with the cardiac pharmacological stress agent (ex. nitrates - including erectile dysfunction medications, isosorbide mononitrate, tamulosin or beta-blockers) the day of the exam. (Erectile dysfunction medication (Cialis) should be held for at least 72 hrs prior to test) Theophylline containing medications for 12 hours. Dipyridamole 48 hours prior to the test. Your remaining medications may be taken with water.  2. Nothing to eat or drink, except water, 3 hours prior to arrival time.   NO caffeine/decaffeinated products, or chocolate 12 hours prior to  arrival.  3. NO perfume, cologne or lotion  4. Total time is 1 to 2 hours; you may want to bring reading material for the waiting time.  5. Please report to Radiology at the [facility] Main Entrance 30 minutes early for your test.  [address] [place], [place] [address]  Diabetic Preparation:  Hold oral medications (Metformin) You may take NPH and Lantus insulin. Do not take Humalog or Humulin R (Regular Insulin) the day of your test. Check blood sugars prior to leaving the house. If able to eat breakfast prior to 3 hour fasting, you may take all medications, including your insulin, Do not worry if you miss your breakfast dose of insulin - start at your next meal.  In preparation for your appointment, medication and supplies will be purchased.  Appointment availability is limited, so if you need to cancel or reschedule, please call the Radiology Department at [phone]  24 hours in advance to avoid a cancellation fee of $100.00  What to Expect After you Arrive:  Once you arrive and check in for your appointment, you will be taken to a preparation room within the Radiology Department.  A technologist or Nurse will obtain your medical history, verify that you are correctly prepped for the exam, and explain the procedure.  Afterwards,  an IV will be started in your arm and electrodes will be placed on your skin for EKG monitoring during the stress portion of the exam. Then you will be escorted to the PET/CT scanner.  There, staff will get you positioned on the scanner and obtain a blood pressure and EKG.  During the exam, you will continue to  be connected to the EKG and blood pressure machines.  A small, safe amount of a radioactive tracer will be injected in your IV to obtain a series of pictures of your heart along with an injection of a stress agent.    After your Exam:  It is recommended that you eat a meal and drink a caffeinated beverage to counter act any effects of  the stress agent.  Drink plenty of fluids for the remainder of the day and urinate frequently for the first couple of hours after the exam.  Your doctor will inform you of your test results within 7-10 business days.  For questions about your test or how to prepare for your test, please call: Rockwell Alexandria, Cardiac Imaging Nurse Navigator  Larey Brick, Cardiac Imaging Nurse Navigator Office: [phone]

## 2022-10-20 NOTE — Progress Notes (Signed)
Date:  10/20/2022   ID:  BURCHARD GATTI, DOB December 15, 1945, MRN [id number]  Patient Location:  Home  Provider location:   [place]  PCP:  Wanda Plump, MD  Cardiologist:  Armanda Magic, MD  Electrophysiologist:  None   Chief Complaint:  CAD,HTN, OSA  History of Present Illness:    SOPHEAP GOETTER is a 77 y.o. male with a hx of severe OSA with AHI 37/hr and now on CPAP at 10cm H2O. he has a history of CAD with coronary CTA showing a calcium score of 0 but 25-49% noncalcified plaque in the mid RCA and mid LAD.  Medical management was recommended at that time.  CT FFR showed a possible flow limiting lesion in the mid to distal LAD but felt likely related to distal tapering of the vessel and not from hemodynamically flow limiting lesion.    He is here today for followup and is doing well.  He tells me that he has been having some problems with heaviness on his chest when he first starts out on a walk but then it subsides.  He has a lot of anxiety which he thinks plays a role.  He walks up to 1-2 hours at a time and has no chest pain when he continues to walk.  He also will have some pain in his chest when he puts his seatbelt on but he says it is not painful to touch and only lasts about 15 minutes. He also says that he will get the heaviness in his chest when driving to the [facility] but once he is there he is fine with no pain.  He has some DOE as well when he gets the pain.  He denies any PND, orthopnea, LE edema, dizziness, palpitations or syncope. He is compliant with his meds and is tolerating meds with no SE.     He is doing well with his CPAP device and thinks that he has gotten used to it.  He tolerates the mask and feels the pressure is adequate.  Since going on CPAP he feels rested in the am and has no significant daytime sleepiness.  He denies any significant mouth or nasal dryness or nasal congestion.  He does not think that he snores.     Prior CV studies:   The following studies were  reviewed today:  Coronary CTA, EKG  Past Medical History:  Diagnosis Date   Allergy    Anxiety    Arthritis    Atrial tachycardia, paroxysmal    Noted to have up to 14 beats in a row of atrial tachycardia on event monitor for/2023   Chronic kidney disease    Coronary artery disease 12/2013   20-30% RCA by cath- states was good.   Diabetes mellitus without complication (HCC)    type II   Drug-induced myopathy    DVT (deep venous thrombosis) (HCC)    bilateral legs-greater on left. -3-5 years ago-tx. coumadin x 1 year.   Eczema    Erectile dysfunction    GERD (gastroesophageal reflux disease)    Hx of cardiovascular stress test    ETT-Myoview (7/15):  ECG with ST depression; freq PVCs, + chest pain; normal perfusion   Hyperlipidemia    LDL goal<100   Hypertension    Macular degeneration    mild   OSA (obstructive sleep apnea)    Severe w AHI 37/hr now on CPAP at 10cm H2O   PVC's (premature ventricular contractions)    Isolated PVCs,  ventricular couplets and triplets, bigeminal and trigeminal PVCs with PVC load less than 1% on event monitor 08/2021   Sleep apnea Use CPAP   Past Surgical History:  Procedure Laterality Date   APPENDECTOMY     open '68   HERNIA REPAIR Bilateral 1980   inguinal   INSERTION OF MESH N/A 06/14/2017   Procedure: INSERTION OF MESH;  Surgeon: Ovidio Kin, MD;  Location: [facility];  Service: General;  Laterality: N/A;   LEFT HEART CATHETERIZATION WITH CORONARY ANGIOGRAM N/A 12/09/2013   Procedure: LEFT HEART CATHETERIZATION WITH CORONARY ANGIOGRAM;  Surgeon: Corky Crafts, MD;  Location: [facility] CATH LAB;  Service: Cardiovascular;  Laterality: N/A;   TONSILLECTOMY     UMBILICAL HERNIA REPAIR N/A 06/14/2017   Procedure: LAPAROSCOPIC UMBILICAL HERNIA REPAIR WITH MESH ERAS PATHWAY;  Surgeon: Ovidio Kin, MD;  Location: [facility] ORS;  Service: General;  Laterality: N/A;  ERAS PATHWAY   VASECTOMY       Current Meds  Medication Sig   acetaminophen  (TYLENOL) 500 MG tablet Take 1,000 mg by mouth daily as needed for moderate pain or headache.   amLODipine (NORVASC) 10 MG tablet Take 1 tablet (10 mg total) by mouth daily.   aspirin EC 81 MG tablet Take 81 mg by mouth daily.   atorvastatin (LIPITOR) 80 MG tablet Take 1 tablet (80 mg total) by mouth at bedtime.   empagliflozin (JARDIANCE) 10 MG TABS tablet Take 1 tablet (10 mg total) by mouth daily before breakfast.   ketoconazole (NIZORAL) 2 % cream Apply 1 application. topically 2 (two) times daily as needed for irritation (eczema).   loratadine (CLARITIN) 10 MG tablet Take 10 mg by mouth daily as needed for allergies.   losartan (COZAAR) 100 MG tablet TAKE 1 TABLET BY MOUTH EVERY DAY   metFORMIN (GLUCOPHAGE) 1000 MG tablet Take 1 tablet (1,000 mg total) by mouth 2 (two) times daily with a meal.   methocarbamol (ROBAXIN-750) 750 MG tablet Take 1 tablet (750 mg total) by mouth 2 (two) times daily as needed for muscle spasms.   Multiple Vitamins-Minerals (PRESERVISION AREDS 2) CAPS Take 1 capsule by mouth in the morning and at bedtime.   multivitamin (ONE-A-DAY MEN'S) TABS tablet Take 1 tablet by mouth daily.   Omega-3 Fatty Acids (FISH OIL) 1000 MG CAPS Take 1 capsule by mouth in the morning and at bedtime.   pioglitazone (ACTOS) 45 MG tablet Take 1 tablet (45 mg total) by mouth every morning.   tadalafil (CIALIS) 20 MG tablet Take 0.5-1 tablets (10-20 mg total) by mouth every other day as needed for erectile dysfunction.   triamcinolone cream (KENALOG) 0.1 % Apply 1 Application topically 2 (two) times daily as needed (eczema).     Allergies:   Ozempic (0.25 or 0.5 mg-dose) [semaglutide(0.25 or 0.5mg -dos)] and Simvastatin   Social History   Tobacco Use   Smoking status: Former    Packs/day: 0.50    Years: 20.00    Additional pack years: 0.00    Total pack years: 10.00    Types: Cigarettes    Quit date: 06/08/2010    Years since quitting: 12.3   Smokeless tobacco: Never   Tobacco  comments:    < 1/2   Vaping Use   Vaping Use: Never used  Substance Use Topics   Alcohol use: Yes    Alcohol/week: 2.0 standard drinks of alcohol    Types: 1 Glasses of wine, 1 Cans of beer per week    Comment: rare beer or  glass of wine   Drug use: No     Family Hx: The patient's family history includes AAA (abdominal aortic aneurysm) in his father; Arthritis in his father; CAD in his father; Cancer in his paternal grandmother; Colon cancer in his mother; Heart attack in his father; Heart attack (age of onset: 75) in his paternal grandfather; Hypertension in his father and another family member; Lung cancer in his father and maternal grandmother; Stroke (age of onset: 53) in his maternal grandfather. There is no history of Prostate cancer.  ROS:   Please see the history of present illness.     All other systems reviewed and are negative.   Labs/Other Tests and Data Reviewed:    Recent Labs: 04/24/2022: ALT 27 09/22/2022: BUN 21; Creatinine 1.1; Hemoglobin 13.9; Potassium 4.1; Sodium 140   Recent Lipid Panel Lab Results  Component Value Date/Time   CHOL 110 07/27/2022 10:40 AM   CHOL 138 10/10/2021 08:18 AM   TRIG 161.0 (H) 07/27/2022 10:40 AM   HDL 42.90 07/27/2022 10:40 AM   HDL 41 10/10/2021 08:18 AM   CHOLHDL 3 07/27/2022 10:40 AM   LDLCALC 35 07/27/2022 10:40 AM   LDLCALC 80 10/10/2021 08:18 AM    Wt Readings from Last 3 Encounters:  10/20/22 220 lb (99.8 kg)  07/27/22 227 lb 8 oz (103.2 kg)  04/24/22 222 lb (100.7 kg)     Objective:    Vital Signs:  BP (!) 142/78   Pulse 67   Ht 5\' 10"  (1.778 m)   Wt 220 lb (99.8 kg)   SpO2 98%   BMI 31.57 kg/m   GEN: Well nourished, well developed in no acute distress HEENT: Normal NECK: No JVD; No carotid bruits LYMPHATICS: No lymphadenopathy CARDIAC:RRR, no murmurs, rubs, gallops RESPIRATORY:  Clear to auscultation without rales, wheezing or rhonchi  ABDOMEN: Soft, non-tender, non-distended MUSCULOSKELETAL:  No  edema; No deformity  SKIN: Warm and dry NEUROLOGIC:  Alert and oriented x 3 PSYCHIATRIC:  Normal affect    EKG was performed in the office today and demonstrates NSR with LAFB  ASSESSMENT & PLAN:    1.  ASCAD/Chest pain -coronary CTA showed a calcium score of 0 but 25-49% noncalcified plaque in the mid RCA and mid LAD.   -CT FFR showed a possible flow limiting lesion in the mid to distal LAD but felt likely related to distal tapering of the vessel and not from hemodynamically flow limiting lesion.   -Medical management was recommended at that time.  -Nuclear stress test done for chest pain in 2023 showed no ischemia -He tells me that he has been having some problems with heaviness on his chest when he first starts out on a walk but then it subsides.  He has a lot of anxiety which he thinks plays a role.  He walks up to 1-2 hours at a time and has no chest pain when he continues to walk.  He also will have some pain in his chest when he puts his seatbelt on but he says it is not painful to touch and only lasts about 15 minutes. He also says that he will get the heaviness in his chest when driving to the [facility] but once he is there he is fine with no pain -I will get an ETT to rule out ischemia -Informed Consent   Shared Decision Making/Informed Consent The risks [chest pain, shortness of breath, cardiac arrhythmias, dizziness, blood pressure fluctuations, myocardial infarction, stroke/transient ischemic attack, nausea, vomiting, allergic reaction,  radiation exposure, metallic taste sensation and life-threatening complications (estimated to be 1 in 10,000)], benefits (risk stratification, diagnosing coronary artery disease, treatment guidance) and alternatives of a cardiac PET stress test were discussed in detail with Mr. Whitehouse and he agrees to proceed. -Prescription drug management with aspirin 81 mg daily, atorvastatin 80 mg daily with as needed refills continue  2.  HTN -BP is borderline  controlled on exam today -Continue prescription drug management with losartan 100 mg daily, amlodipine 10 mg daily With as needed refills -he was intolerant to BB therapy -He recently was referred to nephrology for HTN and was started on Hydralazine 25mg  BID but he has not started it>>I encouraged him to start this due to his BP goal of < 130/54mmHg -I have personally reviewed and interpreted outside labs performed by patient's PCP which showed serum creatinine 1.11 and potassium 4.1 on 09/22/2022  3.  HLD -LDL goal < 70 -I have personally reviewed and interpreted outside labs performed by patient's PCP which showed LDL 35 and HDL 42 on 07/27/2022 -Continue drug management atorvastatin 40 mg daily with as needed refills  4.  OSA - The patient is tolerating PAP therapy well without any problems. The PAP download performed by his DME was personally reviewed and interpreted by me today and showed an AHI of 1.4/hr on auto CPAP from 5 to 18 cm H2O with 100% compliance in using more than 4 hours nightly.  The patient has been using and benefiting from PAP use and will continue to benefit from therapy.      Medication Adjustments/Labs and Tests Ordered: Current medicines are reviewed at length with the patient today.  Concerns regarding medicines are outlined above.  Tests Ordered: Orders Placed This Encounter  Procedures   EKG 12-Lead   Medication Changes: No orders of the defined types were placed in this encounter.   Disposition:  Follow up in 1 year  Signed, Armanda Magic, MD  10/20/2022 8:49 AM    [facility]

## 2022-10-20 NOTE — Addendum Note (Signed)
Addended by: Franchot Gallo on: 10/20/2022 09:01 AM   Modules accepted: Orders

## 2022-10-27 NOTE — Addendum Note (Signed)
Addended by: Quintella Reichert on: 10/27/2022 07:49 AM   Modules accepted: Orders

## 2022-10-28 ENCOUNTER — Other Ambulatory Visit: Payer: Self-pay | Admitting: Internal Medicine

## 2022-11-02 ENCOUNTER — Other Ambulatory Visit: Payer: Self-pay | Admitting: Cardiology

## 2022-11-19 ENCOUNTER — Other Ambulatory Visit: Payer: Self-pay | Admitting: Internal Medicine

## 2022-11-28 ENCOUNTER — Ambulatory Visit (INDEPENDENT_AMBULATORY_CARE_PROVIDER_SITE_OTHER): Payer: Medicare Other | Admitting: Internal Medicine

## 2022-11-28 ENCOUNTER — Encounter: Payer: Self-pay | Admitting: Internal Medicine

## 2022-11-28 VITALS — BP 140/68 | HR 72 | Temp 97.8°F | Resp 18 | Ht 70.0 in | Wt 223.5 lb

## 2022-11-28 DIAGNOSIS — Z7984 Long term (current) use of oral hypoglycemic drugs: Secondary | ICD-10-CM

## 2022-11-28 DIAGNOSIS — I2510 Atherosclerotic heart disease of native coronary artery without angina pectoris: Secondary | ICD-10-CM

## 2022-11-28 DIAGNOSIS — I10 Essential (primary) hypertension: Secondary | ICD-10-CM

## 2022-11-28 DIAGNOSIS — E7800 Pure hypercholesterolemia, unspecified: Secondary | ICD-10-CM | POA: Diagnosis not present

## 2022-11-28 DIAGNOSIS — E1169 Type 2 diabetes mellitus with other specified complication: Secondary | ICD-10-CM

## 2022-11-28 LAB — LIPID PANEL
Cholesterol: 122 mg/dL (ref 0–200)
HDL: 44.7 mg/dL (ref >39.00)
LDL Cholesterol: 56 mg/dL (ref 0–99)
NonHDL: 77.27
Total CHOL/HDL Ratio: 3
Triglycerides: 107 mg/dL (ref 0.0–149.0)
VLDL: 21.4 mg/dL (ref 0.0–40.0)

## 2022-11-28 LAB — HEMOGLOBIN A1C: Hgb A1c MFr Bld: 6.9 % — ABNORMAL HIGH (ref 4.6–6.5)

## 2022-11-28 NOTE — Progress Notes (Signed)
Subjective:    Patient ID: Carl Knapp, male    DOB: [birth year], 77 y.o.   MRN: [id number]  DOS:  11/28/2022 Type of visit - description: f/u and sick visit  Saw cardiology, notes reviewed. Chronic medical problems addressed.  Also, yesterday feels slightly dizzy and not short of breath. This morning developed a cough with essentially no sputum. No fever or chills No nausea vomiting. Mild unusual aches. Had a COVID test at home today: Negative. Denies chest pain today.   Review of Systems See above   Past Medical History:  Diagnosis Date   Allergy    Anxiety    Arthritis    Atrial tachycardia, paroxysmal    Noted to have up to 14 beats in a row of atrial tachycardia on event monitor for/2023   Chronic kidney disease    Coronary artery disease 12/2013   20-30% RCA by cath- states was good.   Diabetes mellitus without complication (HCC)    type II   Drug-induced myopathy    DVT (deep venous thrombosis) (HCC)    bilateral legs-greater on left. -3-5 years ago-tx. coumadin x 1 year.   Eczema    Erectile dysfunction    GERD (gastroesophageal reflux disease)    Hx of cardiovascular stress test    ETT-Myoview (7/15):  ECG with ST depression; freq PVCs, + chest pain; normal perfusion   Hyperlipidemia    LDL goal<100   Hypertension    Macular degeneration    mild   OSA (obstructive sleep apnea)    Severe w AHI 37/hr now on CPAP at 10cm H2O   PVC's (premature ventricular contractions)    Isolated PVCs, ventricular couplets and triplets, bigeminal and trigeminal PVCs with PVC load less than 1% on event monitor 08/2021   Sleep apnea Use CPAP    Past Surgical History:  Procedure Laterality Date   APPENDECTOMY     open '68   HERNIA REPAIR Bilateral 1980   inguinal   INSERTION OF MESH N/A 06/14/2017   Procedure: INSERTION OF MESH;  Surgeon: Ovidio Kin, MD;  Location: [facility];  Service: General;  Laterality: N/A;   LEFT HEART CATHETERIZATION WITH CORONARY ANGIOGRAM N/A  12/09/2013   Procedure: LEFT HEART CATHETERIZATION WITH CORONARY ANGIOGRAM;  Surgeon: Corky Crafts, MD;  Location: [facility] CATH LAB;  Service: Cardiovascular;  Laterality: N/A;   TONSILLECTOMY     UMBILICAL HERNIA REPAIR N/A 06/14/2017   Procedure: LAPAROSCOPIC UMBILICAL HERNIA REPAIR WITH MESH ERAS PATHWAY;  Surgeon: Ovidio Kin, MD;  Location: [facility];  Service: General;  Laterality: N/A;  ERAS PATHWAY   VASECTOMY      Current Outpatient Medications  Medication Instructions   acetaminophen (TYLENOL) 1,000 mg, Oral, Daily PRN   amLODipine (NORVASC) 10 mg, Oral, Daily   aspirin EC 81 mg, Daily   atorvastatin (LIPITOR) 40 mg, Oral, Daily at bedtime   empagliflozin (JARDIANCE) 10 mg, Oral, Daily before breakfast   hydrALAZINE (APRESOLINE) 25 mg, Oral, 3 times daily   ketoconazole (NIZORAL) 2 % cream 1 application , Topical, 2 times daily PRN   loratadine (CLARITIN) 10 mg, Oral, Daily PRN   losartan (COZAAR) 100 mg, Oral, Daily   metFORMIN (GLUCOPHAGE) 1,000 mg, Oral, 2 times daily with meals   methocarbamol (ROBAXIN-750) 750 mg, Oral, 2 times daily PRN   Multiple Vitamins-Minerals (PRESERVISION AREDS 2) CAPS 1 capsule, Oral, 2 times daily   multivitamin (ONE-A-DAY MEN'S) TABS tablet 1 tablet, Oral, Daily   Omega-3 Fatty Acids (FISH OIL)  1000 MG CAPS 1 capsule, Oral, 2 times daily   pioglitazone (ACTOS) 45 mg, Oral, Every morning   tadalafil (CIALIS) 10-20 mg, Oral, Every 48 hours PRN   triamcinolone cream (KENALOG) 0.1 % 1 Application, Topical, 2 times daily PRN       Objective:   Physical Exam BP (!) 140/68   Pulse 72   Temp 97.8 F (36.6 C) (Oral)   Resp 18   Ht 5\' 10"  (1.778 m)   Wt 223 lb 8 oz (101.4 kg)   SpO2 96%   BMI 32.07 kg/m  General:   Well developed, NAD, BMI noted. HEENT:  Normocephalic . Face symmetric, atraumatic Minimal nose congestion.  Throat symmetric not red. Lungs:  CTA B Normal respiratory effort, no intercostal retractions, no accessory muscle  use. Heart: RRR,  no murmur.  Lower extremities: no pretibial edema bilaterally  Skin: Not pale. Not jaundice Neurologic:  alert & oriented X3.  Speech normal, gait appropriate for age and unassisted Psych--  Cognition and judgment appear intact.  Cooperative with normal attention span and concentration.  Behavior appropriate. No anxious or depressed appearing.      Assessment    Assessment  (new patient 09/2021, previous PCP retiring) DM dx ~ 2010 + Microalbumin: Refer to nephrology 07-2022 HTN.  Beta-blocker intolerant and per cardiology note 10/2022. High cholesterol CV: --- CAD, dr turner  ---TIA (?)  June 2023 ---SVT OSA, Cpap, Dr Mayford Knife  GERD (remotely) Macular degeneration H/o DVT  PLAN DM: Currently on Jardiance, metformin, pioglitazone. Last A1c 6.8.  Recheck + Microalbumin: Was referred to nephrology 07/2022, will get notes. HTN: On losartan, amlodipine, hydralazine was added by nephrology.  Last creatinine 1.1 (Sep 22, 2022 per [facility] BP today recheck: 140/68.  At home is mostly in the 130s with occasional excursions in the 145. Plan: Increase Hyzaar to 3 times daily, continue monitoring BPs. High cholesterol: Last LDL was excellent at 35, the patient felt that it was very good and self decrease atorvastatin from 80 mg to 40 mg at least the last 2 months. LDL goal less than 70, check labs.  Adjust medication if necessary CAD: Saw cardiology 10/20/2022, c/o chest heaviness, was Rx a cardiac PET stress test. URI: Mild symptoms started yesterday, exam is benign, VSS, COVID test negative today at home.  Conservative treatment, if no better recheck for COVID and let me know. RTC 4 months

## 2022-11-28 NOTE — Patient Instructions (Addendum)
Increase hydralazine to 1 tablet 3 times a day.  Continue checking your blood pressures  Check the  blood pressure regularly BP GOAL is between 110/65 and  135/85. If it is consistently higher or lower, let me know  For now continue atorvastatin 80 mg: Half tablet daily.  We are checking your blood work.  Seems like you have a cold. Rest, fluids Robitussin if needed. Check for COVID again if you are not gradually better in the next 48 hours. If your symptoms are severe let us know   Vaccines I recommend: Covid booster RSV vaccine Flu shot this fall     GO TO THE LAB : Get the blood work     GO TO THE FRONT DESK, PLEASE SCHEDULE YOUR APPOINTMENTS Come back for   a checkup in 4 months

## 2022-11-29 ENCOUNTER — Ambulatory Visit: Payer: Medicare Other | Admitting: Psychiatry

## 2022-11-29 NOTE — Assessment & Plan Note (Signed)
DM: Currently on Jardiance, metformin, pioglitazone. Last A1c 6.8.  Recheck + Microalbumin: Was referred to nephrology 07/2022, will get notes. HTN: On losartan, amlodipine, hydralazine was added by nephrology.  Last creatinine 1.1 (Sep 22, 2022 per [facility]). BP today recheck: 140/68.  At home is mostly in the 130s with occasional excursions in the 145. Plan: Increase Hyzaar to 3 times daily, continue monitoring BPs. High cholesterol: Last LDL was excellent at 35, the patient felt that it was very good and self decrease atorvastatin from 80 mg to 40 mg at least the last 2 months. LDL goal less than 70, check labs.  Adjust medication if necessary CAD: Saw cardiology 10/20/2022, c/o chest heaviness, was Rx a cardiac PET stress test. URI: Mild symptoms started yesterday, exam is benign, VSS, COVID test negative today at home.  Conservative treatment, if no better recheck for COVID and let me know. RTC 4 months

## 2022-12-20 ENCOUNTER — Telehealth (HOSPITAL_COMMUNITY): Payer: Self-pay | Admitting: Emergency Medicine

## 2022-12-20 NOTE — Telephone Encounter (Signed)
Reaching out to patient to offer assistance regarding upcoming cardiac imaging study; pt verbalizes understanding of appt date/time, parking situation and where to check in, pre-test NPO status and medications ordered, and verified current allergies; name and call back number provided for further questions should they arise Sara Wallace RN Navigator Cardiac Imaging [facility] [phone] office [phone] cell 

## 2022-12-21 ENCOUNTER — Encounter: Payer: Self-pay | Admitting: Cardiology

## 2022-12-21 ENCOUNTER — Ambulatory Visit
Admission: RE | Admit: 2022-12-21 | Discharge: 2022-12-21 | Disposition: A | Discharge status: Home / Self Care | Payer: Medicare Other | Source: Ambulatory Visit | Attending: Cardiology | Admitting: Cardiology

## 2022-12-21 DIAGNOSIS — J439 Emphysema, unspecified: Secondary | ICD-10-CM | POA: Diagnosis not present

## 2022-12-21 DIAGNOSIS — R079 Chest pain, unspecified: Secondary | ICD-10-CM | POA: Diagnosis not present

## 2022-12-21 DIAGNOSIS — I2510 Atherosclerotic heart disease of native coronary artery without angina pectoris: Secondary | ICD-10-CM

## 2022-12-21 DIAGNOSIS — I700 Atherosclerosis of aorta: Secondary | ICD-10-CM | POA: Insufficient documentation

## 2022-12-21 LAB — NM PET CT CARDIAC PERFUSION MULTI W/ABSOLUTE BLOODFLOW
LV dias vol: 130 mL (ref 62–150)
MBFR: 2.02
Nuc Rest EF: 55 %
Nuc Stress EF: 61 %
Peak HR: 117 {beats}/min
Rest HR: 77 {beats}/min
Rest MBF: 0.82 ml/g/min
Rest Nuclear Isotope Dose: 25.1 mCi
Rest perfusion cavity size (mL): 130 mL
SRS: 0
SSS: 0
ST Depression (mm): 0 mm
Stress MBF: 1.66 ml/g/min
Stress Nuclear Isotope Dose: 25 mCi
Stress perfusion cavity size (mL): 140 mL
TID: 1.05

## 2022-12-21 MED ORDER — RUBIDIUM RB82 GENERATOR (RUBYFILL)
25.0000 | PACK | Freq: Once | INTRAVENOUS | Status: AC
  Administered 2022-12-21: 25.06 via INTRAVENOUS

## 2022-12-21 MED ORDER — REGADENOSON 0.4 MG/5ML IV SOLN
INTRAVENOUS | Status: AC
  Filled 2022-12-21: qty 5

## 2022-12-21 MED ORDER — REGADENOSON 0.4 MG/5ML IV SOLN
0.4000 mg | Freq: Once | INTRAVENOUS | Status: AC
  Administered 2022-12-21: 0.4 mg via INTRAVENOUS
  Filled 2022-12-21: qty 5

## 2022-12-21 MED ORDER — RUBIDIUM RB82 GENERATOR (RUBYFILL)
25.0000 | PACK | Freq: Once | INTRAVENOUS | Status: AC
  Administered 2022-12-21: 25.04 via INTRAVENOUS

## 2022-12-21 NOTE — Progress Notes (Signed)
Patient presents for a cardiac PET stress test and tolerated procedure without incident. Patient maintained acceptable vital signs throughout the test and was offered caffeine after test.  Patient ambulated out of department with a steady gait.  

## 2022-12-21 NOTE — Progress Notes (Signed)
Dr. Flora Lipps aware of low diastolic BP. Ok discharge patient.

## 2022-12-22 ENCOUNTER — Telehealth: Payer: Self-pay

## 2022-12-22 NOTE — Telephone Encounter (Signed)
-----   Message from Armanda Magic sent at 12/21/2022  1:28 PM EDT ----- Aortic atherosclerosis and emphysema noted on Chest CT - please forward to PCP to review.  Cardiac portion pending

## 2022-12-22 NOTE — Telephone Encounter (Signed)
Notified patient of normal stress test result as well as aortic atherosclerosis and emphysema noted on Chest CT. Advised patient I would forward to PCP to review, patient verbalizes understanding.

## 2023-01-09 DIAGNOSIS — N1831 Chronic kidney disease, stage 3a: Secondary | ICD-10-CM | POA: Diagnosis not present

## 2023-01-09 LAB — BASIC METABOLIC PANEL
BUN: 26 — AB (ref 4–21)
CO2: 25 — AB (ref 13–22)
Chloride: 107 (ref 99–108)
Creatinine: 1.1 (ref 0.6–1.3)
Glucose: 212
Potassium: 4.4 meq/L (ref 3.5–5.1)
Sodium: 144 (ref 137–147)

## 2023-01-09 LAB — COMPREHENSIVE METABOLIC PANEL
Albumin: 4.3 (ref 3.5–5.0)
Calcium: 9.4 (ref 8.7–10.7)
eGFR: 68

## 2023-01-09 LAB — PROTEIN / CREATININE RATIO, URINE: Creatinine, Urine: 49.5

## 2023-01-09 LAB — VITAMIN D 25 HYDROXY (VIT D DEFICIENCY, FRACTURES): Vit D, 25-Hydroxy: 43.6

## 2023-01-11 ENCOUNTER — Encounter (HOSPITAL_COMMUNITY)
Admission: EM | Disposition: A | Discharge status: Home / Self Care | Payer: Self-pay | Source: Home / Self Care | Attending: Emergency Medicine

## 2023-01-11 ENCOUNTER — Emergency Department (HOSPITAL_BASED_OUTPATIENT_CLINIC_OR_DEPARTMENT_OTHER): Payer: Medicare Other | Admitting: Anesthesiology

## 2023-01-11 ENCOUNTER — Ambulatory Visit (HOSPITAL_COMMUNITY)
Admission: EM | Admit: 2023-01-11 | Discharge: 2023-01-11 | Disposition: A | Discharge status: Home / Self Care | Payer: Medicare Other | Attending: Emergency Medicine | Admitting: Emergency Medicine

## 2023-01-11 ENCOUNTER — Encounter (HOSPITAL_COMMUNITY): Payer: Self-pay

## 2023-01-11 ENCOUNTER — Other Ambulatory Visit: Payer: Self-pay

## 2023-01-11 ENCOUNTER — Emergency Department (HOSPITAL_COMMUNITY): Payer: Medicare Other | Admitting: Anesthesiology

## 2023-01-11 ENCOUNTER — Emergency Department (HOSPITAL_COMMUNITY): Payer: Medicare Other

## 2023-01-11 DIAGNOSIS — Z7982 Long term (current) use of aspirin: Secondary | ICD-10-CM | POA: Diagnosis not present

## 2023-01-11 DIAGNOSIS — N189 Chronic kidney disease, unspecified: Secondary | ICD-10-CM | POA: Diagnosis not present

## 2023-01-11 DIAGNOSIS — G4733 Obstructive sleep apnea (adult) (pediatric): Secondary | ICD-10-CM | POA: Insufficient documentation

## 2023-01-11 DIAGNOSIS — S62603B Fracture of unspecified phalanx of left middle finger, initial encounter for open fracture: Secondary | ICD-10-CM | POA: Insufficient documentation

## 2023-01-11 DIAGNOSIS — E1122 Type 2 diabetes mellitus with diabetic chronic kidney disease: Secondary | ICD-10-CM | POA: Diagnosis not present

## 2023-01-11 DIAGNOSIS — S62635A Displaced fracture of distal phalanx of left ring finger, initial encounter for closed fracture: Secondary | ICD-10-CM | POA: Diagnosis not present

## 2023-01-11 DIAGNOSIS — S62605B Fracture of unspecified phalanx of left ring finger, initial encounter for open fracture: Secondary | ICD-10-CM | POA: Diagnosis not present

## 2023-01-11 DIAGNOSIS — Y92008 Other place in unspecified non-institutional (private) residence as the place of occurrence of the external cause: Secondary | ICD-10-CM | POA: Insufficient documentation

## 2023-01-11 DIAGNOSIS — W312XXA Contact with powered woodworking and forming machines, initial encounter: Secondary | ICD-10-CM | POA: Diagnosis not present

## 2023-01-11 DIAGNOSIS — Z7984 Long term (current) use of oral hypoglycemic drugs: Secondary | ICD-10-CM | POA: Insufficient documentation

## 2023-01-11 DIAGNOSIS — Y93H3 Activity, building and construction: Secondary | ICD-10-CM | POA: Diagnosis not present

## 2023-01-11 DIAGNOSIS — I129 Hypertensive chronic kidney disease with stage 1 through stage 4 chronic kidney disease, or unspecified chronic kidney disease: Secondary | ICD-10-CM | POA: Diagnosis not present

## 2023-01-11 DIAGNOSIS — I10 Essential (primary) hypertension: Secondary | ICD-10-CM | POA: Diagnosis not present

## 2023-01-11 DIAGNOSIS — S62631A Displaced fracture of distal phalanx of left index finger, initial encounter for closed fracture: Secondary | ICD-10-CM | POA: Diagnosis not present

## 2023-01-11 DIAGNOSIS — S6992XA Unspecified injury of left wrist, hand and finger(s), initial encounter: Secondary | ICD-10-CM | POA: Diagnosis not present

## 2023-01-11 DIAGNOSIS — Z23 Encounter for immunization: Secondary | ICD-10-CM | POA: Diagnosis not present

## 2023-01-11 DIAGNOSIS — Z87891 Personal history of nicotine dependence: Secondary | ICD-10-CM | POA: Insufficient documentation

## 2023-01-11 DIAGNOSIS — L089 Local infection of the skin and subcutaneous tissue, unspecified: Secondary | ICD-10-CM | POA: Diagnosis not present

## 2023-01-11 DIAGNOSIS — S68115A Complete traumatic metacarpophalangeal amputation of left ring finger, initial encounter: Secondary | ICD-10-CM

## 2023-01-11 DIAGNOSIS — S68621A Partial traumatic transphalangeal amputation of left index finger, initial encounter: Secondary | ICD-10-CM | POA: Diagnosis not present

## 2023-01-11 DIAGNOSIS — N1831 Chronic kidney disease, stage 3a: Secondary | ICD-10-CM | POA: Diagnosis not present

## 2023-01-11 DIAGNOSIS — I2510 Atherosclerotic heart disease of native coronary artery without angina pectoris: Secondary | ICD-10-CM | POA: Insufficient documentation

## 2023-01-11 DIAGNOSIS — S61211A Laceration without foreign body of left index finger without damage to nail, initial encounter: Secondary | ICD-10-CM | POA: Diagnosis not present

## 2023-01-11 DIAGNOSIS — S68118A Complete traumatic metacarpophalangeal amputation of other finger, initial encounter: Secondary | ICD-10-CM

## 2023-01-11 DIAGNOSIS — L03012 Cellulitis of left finger: Secondary | ICD-10-CM | POA: Diagnosis not present

## 2023-01-11 HISTORY — DX: Complete traumatic metacarpophalangeal amputation of left ring finger, initial encounter: S68.115A

## 2023-01-11 HISTORY — PX: AMPUTATION FINGER: SHX6594

## 2023-01-11 HISTORY — PX: INCISION AND DRAINAGE: SHX5863

## 2023-01-11 HISTORY — DX: Complete traumatic metacarpophalangeal amputation of other finger, initial encounter: S68.118A

## 2023-01-11 LAB — COMPREHENSIVE METABOLIC PANEL
ALT: 25 U/L (ref 0–44)
AST: 27 U/L (ref 15–41)
Albumin: 3.8 g/dL (ref 3.5–5.0)
Alkaline Phosphatase: 59 U/L (ref 38–126)
Anion gap: 11 (ref 5–15)
BUN: 25 mg/dL — ABNORMAL HIGH (ref 8–23)
CO2: 21 mmol/L — ABNORMAL LOW (ref 22–32)
Calcium: 8.6 mg/dL — ABNORMAL LOW (ref 8.9–10.3)
Chloride: 108 mmol/L (ref 98–111)
Creatinine, Ser: 1.16 mg/dL (ref 0.61–1.24)
GFR, Estimated: >60 mL/min (ref >60)
Glucose, Bld: 119 mg/dL — ABNORMAL HIGH (ref 70–99)
Potassium: 3.6 mmol/L (ref 3.5–5.1)
Sodium: 140 mmol/L (ref 135–145)
Total Bilirubin: 0.8 mg/dL (ref 0.3–1.2)
Total Protein: 7 g/dL (ref 6.5–8.1)

## 2023-01-11 LAB — CBC WITH DIFFERENTIAL/PLATELET
Abs Immature Granulocytes: 0.03 10*3/uL (ref 0.00–0.07)
Basophils Absolute: 0 10*3/uL (ref 0.0–0.1)
Basophils Relative: 0 %
Eosinophils Absolute: 0.4 10*3/uL (ref 0.0–0.5)
Eosinophils Relative: 5 %
HCT: 39.3 % (ref 39.0–52.0)
Hemoglobin: 12.8 g/dL — ABNORMAL LOW (ref 13.0–17.0)
Immature Granulocytes: 0 %
Lymphocytes Relative: 20 %
Lymphs Abs: 1.4 10*3/uL (ref 0.7–4.0)
MCH: 31.4 pg (ref 26.0–34.0)
MCHC: 32.6 g/dL (ref 30.0–36.0)
MCV: 96.3 fL (ref 80.0–100.0)
Monocytes Absolute: 0.7 10*3/uL (ref 0.1–1.0)
Monocytes Relative: 10 %
Neutro Abs: 4.7 10*3/uL (ref 1.7–7.7)
Neutrophils Relative %: 65 %
Platelets: 213 10*3/uL (ref 150–400)
RBC: 4.08 MIL/uL — ABNORMAL LOW (ref 4.22–5.81)
RDW: 13.3 % (ref 11.5–15.5)
WBC: 7.2 10*3/uL (ref 4.0–10.5)
nRBC: 0 % (ref 0.0–0.2)

## 2023-01-11 LAB — GLUCOSE, CAPILLARY
Glucose-Capillary: 115 mg/dL — ABNORMAL HIGH (ref 70–99)
Glucose-Capillary: 131 mg/dL — ABNORMAL HIGH (ref 70–99)

## 2023-01-11 LAB — CBG MONITORING, ED: Glucose-Capillary: 118 mg/dL — ABNORMAL HIGH (ref 70–99)

## 2023-01-11 SURGERY — INCISION AND DRAINAGE
Anesthesia: Monitor Anesthesia Care | Laterality: Left

## 2023-01-11 MED ORDER — FENTANYL CITRATE PF 50 MCG/ML IJ SOSY
50.0000 ug | PREFILLED_SYRINGE | Freq: Once | INTRAMUSCULAR | Status: AC
  Administered 2023-01-11: 50 ug via INTRAVENOUS
  Filled 2023-01-11: qty 1

## 2023-01-11 MED ORDER — ONDANSETRON HCL 4 MG/2ML IJ SOLN
4.0000 mg | Freq: Once | INTRAMUSCULAR | Status: AC
  Administered 2023-01-11: 4 mg via INTRAVENOUS
  Filled 2023-01-11: qty 2

## 2023-01-11 MED ORDER — OXYCODONE HCL 5 MG PO TABS: 5.0000 mg | ORAL_TABLET | Freq: Once | ORAL | Status: DC | PRN

## 2023-01-11 MED ORDER — MIDAZOLAM HCL 2 MG/2ML IJ SOLN
INTRAMUSCULAR | Status: AC
  Filled 2023-01-11: qty 2

## 2023-01-11 MED ORDER — OXYCODONE HCL 5 MG/5ML PO SOLN: 5.0000 mg | Freq: Once | ORAL | Status: DC | PRN

## 2023-01-11 MED ORDER — FENTANYL CITRATE PF 50 MCG/ML IJ SOSY: 25.0000 ug | PREFILLED_SYRINGE | INTRAMUSCULAR | Status: DC | PRN

## 2023-01-11 MED ORDER — ACETAMINOPHEN 160 MG/5ML PO SOLN: 1000.0000 mg | Freq: Once | ORAL | Status: DC | PRN

## 2023-01-11 MED ORDER — ACETAMINOPHEN 500 MG PO TABS: 1000.0000 mg | ORAL_TABLET | Freq: Once | ORAL | Status: DC | PRN

## 2023-01-11 MED ORDER — MORPHINE SULFATE (PF) 4 MG/ML IV SOLN
4.0000 mg | Freq: Once | INTRAVENOUS | Status: AC
  Administered 2023-01-11: 4 mg via INTRAVENOUS
  Filled 2023-01-11: qty 1

## 2023-01-11 MED ORDER — LIDOCAINE-EPINEPHRINE 1 %-1:100000 IJ SOLN
INTRAMUSCULAR | Status: AC
  Filled 2023-01-11: qty 1

## 2023-01-11 MED ORDER — LACTATED RINGERS IV SOLN: INTRAVENOUS | Status: DC

## 2023-01-11 MED ORDER — LIDOCAINE 2% (20 MG/ML) 5 ML SYRINGE
INTRAMUSCULAR | Status: DC | PRN
Start: 2023-01-11 — End: 2023-01-12
  Administered 2023-01-11: 7 mL via INTRAVENOUS

## 2023-01-11 MED ORDER — CEFAZOLIN SODIUM-DEXTROSE 2-3 GM-%(50ML) IV SOLR
INTRAVENOUS | Status: DC | PRN
  Administered 2023-01-11: 2 g via INTRAVENOUS

## 2023-01-11 MED ORDER — MIDAZOLAM HCL 5 MG/5ML IJ SOLN
INTRAMUSCULAR | Status: DC | PRN
  Administered 2023-01-11: 1 mg via INTRAVENOUS

## 2023-01-11 MED ORDER — CEFAZOLIN SODIUM-DEXTROSE 2-4 GM/100ML-% IV SOLN
2.0000 g | Freq: Once | INTRAVENOUS | Status: AC
  Administered 2023-01-11: 2 g via INTRAVENOUS
  Filled 2023-01-11: qty 100

## 2023-01-11 MED ORDER — CEPHALEXIN 500 MG PO CAPS: 500.0000 mg | ORAL_CAPSULE | Freq: Four times a day (QID) | ORAL | 0 refills | Status: AC

## 2023-01-11 MED ORDER — 0.9 % SODIUM CHLORIDE (POUR BTL) OPTIME: TOPICAL | Status: DC | PRN

## 2023-01-11 MED ORDER — TETANUS-DIPHTH-ACELL PERTUSSIS 5-2.5-18.5 LF-MCG/0.5 IM SUSY
0.5000 mL | PREFILLED_SYRINGE | Freq: Once | INTRAMUSCULAR | Status: AC
  Administered 2023-01-11: 0.5 mL via INTRAMUSCULAR
  Filled 2023-01-11 (×2): qty 0.5

## 2023-01-11 MED ORDER — CHLORHEXIDINE GLUCONATE 0.12 % MT SOLN
15.0000 mL | Freq: Once | OROMUCOSAL | Status: AC
  Administered 2023-01-11: 15 mL via OROMUCOSAL

## 2023-01-11 MED ORDER — CEFAZOLIN SODIUM 1 G IJ SOLR
INTRAMUSCULAR | Status: AC
  Filled 2023-01-11: qty 20

## 2023-01-11 MED ORDER — SODIUM CHLORIDE 0.9 % IR SOLN
Status: DC | PRN
  Administered 2023-01-11: 3000 mL

## 2023-01-11 MED ORDER — SODIUM BICARBONATE 8.4 % IV SOLN
INTRAVENOUS | Status: AC
  Filled 2023-01-11: qty 50

## 2023-01-11 MED ORDER — SODIUM CHLORIDE 0.9 % IV BOLUS
500.0000 mL | Freq: Once | INTRAVENOUS | Status: AC
  Administered 2023-01-11: 500 mL via INTRAVENOUS

## 2023-01-11 MED ORDER — ACETAMINOPHEN 10 MG/ML IV SOLN: 1000.0000 mg | Freq: Once | INTRAVENOUS | Status: DC | PRN

## 2023-01-11 MED ORDER — OXYCODONE HCL 5 MG PO TABS
5.0000 mg | ORAL_TABLET | Freq: Four times a day (QID) | ORAL | 0 refills | Status: DC | PRN
Start: 2023-01-11 — End: 2023-04-09

## 2023-01-11 MED ORDER — LIDOCAINE HCL 2 % IJ SOLN
INTRAMUSCULAR | Status: AC
  Filled 2023-01-11: qty 20

## 2023-01-11 SURGICAL SUPPLY — 50 items
APL PRP STRL LF DISP 70% ISPRP (MISCELLANEOUS) ×2
BAG COUNTER SPONGE SURGICOUNT (BAG) ×1 IMPLANT
BAG SPEC THK2 15X12 ZIP CLS (MISCELLANEOUS) ×1
BAG SPNG CNTER NS LX DISP (BAG) ×1
BAG ZIPLOCK 12X15 (MISCELLANEOUS) ×1 IMPLANT
BLADE SURG 15 STRL LF DISP TIS (BLADE) ×2 IMPLANT
BLADE SURG 15 STRL SS (BLADE) ×2
BNDG CMPR 6 X 5 YARDS HK CLSR (GAUZE/BANDAGES/DRESSINGS) ×1
BNDG CMPR MD 5X2 ELC HKLP STRL (GAUZE/BANDAGES/DRESSINGS) ×1
BNDG ELASTIC 2 VLCR STRL LF (GAUZE/BANDAGES/DRESSINGS) IMPLANT
BNDG ELASTIC 6INX 5YD STR LF (GAUZE/BANDAGES/DRESSINGS) ×1 IMPLANT
BNDG GAUZE DERMACEA FLUFF 4 (GAUZE/BANDAGES/DRESSINGS) ×1 IMPLANT
BNDG GZE DERMACEA 4 6PLY (GAUZE/BANDAGES/DRESSINGS) ×1
CHLORAPREP W/TINT 26 (MISCELLANEOUS) ×2 IMPLANT
CNTNR URN SCR LID CUP LEK RST (MISCELLANEOUS) ×3 IMPLANT
CONT SPEC 4OZ STRL OR WHT (MISCELLANEOUS) ×3
COVER SURGICAL LIGHT HANDLE (MISCELLANEOUS) ×1 IMPLANT
CUFF TOURN SGL QUICK 34 (TOURNIQUET CUFF) ×1
CUFF TRNQT CYL 34X4.125X (TOURNIQUET CUFF) ×1 IMPLANT
DRAPE SHEET LG 3/4 BI-LAMINATE (DRAPES) ×1 IMPLANT
DRSG ADAPTIC 3X8 NADH LF (GAUZE/BANDAGES/DRESSINGS) IMPLANT
GAUZE PAD ABD 8X10 STRL (GAUZE/BANDAGES/DRESSINGS) IMPLANT
GAUZE SPONGE 2X2 8PLY STRL LF (GAUZE/BANDAGES/DRESSINGS) IMPLANT
GAUZE SPONGE 4X4 12PLY STRL (GAUZE/BANDAGES/DRESSINGS) IMPLANT
GAUZE XEROFORM 1X8 LF (GAUZE/BANDAGES/DRESSINGS) IMPLANT
GLOVE BIO SURGEON STRL SZ7.5 (GLOVE) ×1 IMPLANT
GLOVE BIOGEL PI IND STRL 7.5 (GLOVE) ×1 IMPLANT
GOWN STRL REUS W/ TWL LRG LVL3 (GOWN DISPOSABLE) ×1 IMPLANT
GOWN STRL REUS W/TWL LRG LVL3 (GOWN DISPOSABLE) ×1
HANDPIECE INTERPULSE COAX TIP (DISPOSABLE) ×1
JET LAVAGE IRRISEPT WOUND (IRRIGATION / IRRIGATOR) ×1
LAVAGE JET IRRISEPT WOUND (IRRIGATION / IRRIGATOR) ×1 IMPLANT
MANIFOLD NEPTUNE II (INSTRUMENTS) ×1 IMPLANT
PACK ORTHO EXTREMITY (CUSTOM PROCEDURE TRAY) ×1 IMPLANT
PROTECTOR NERVE ULNAR (MISCELLANEOUS) ×1 IMPLANT
SET CYSTO W/LG BORE CLAMP LF (SET/KITS/TRAYS/PACK) ×1 IMPLANT
SET HNDPC FAN SPRY TIP SCT (DISPOSABLE) ×1 IMPLANT
SOLUTION IRRIG SURGIPHOR (IV SOLUTION) ×1 IMPLANT
SPONGE T-LAP 18X18 ~~LOC~~+RFID (SPONGE) ×1 IMPLANT
SPONGE T-LAP 4X18 ~~LOC~~+RFID (SPONGE) ×1 IMPLANT
SUT ETHILON 2 0 FS 18 (SUTURE) IMPLANT
SUT ETHILON 2 0 PSLX (SUTURE) IMPLANT
SUT VIC AB 1 CT1 36 (SUTURE) ×2 IMPLANT
SUT VIC AB 2-0 CT1 27 (SUTURE) ×3
SUT VIC AB 2-0 CT1 TAPERPNT 27 (SUTURE) ×3 IMPLANT
SWAB COLLECTION DEVICE MRSA (MISCELLANEOUS) IMPLANT
SWAB CULTURE ESWAB REG 1ML (MISCELLANEOUS) IMPLANT
TRAY PREMIUM WET SKIN SCRUB (MISCELLANEOUS) ×1 IMPLANT
TUBING CONNECTING 10 (TUBING) ×1 IMPLANT
YANKAUER SUCT BULB TIP NO VENT (SUCTIONS) ×1 IMPLANT

## 2023-01-11 NOTE — Anesthesia Preprocedure Evaluation (Addendum)
Anesthesia Evaluation  Patient identified by MRN, date of birth, ID band Patient awake    Reviewed: Allergy & Precautions, NPO status , Patient's Chart, lab work & pertinent test results  History of Anesthesia Complications Negative for: history of anesthetic complications  Airway Mallampati: III  TM Distance: >3 FB Neck ROM: Full    Dental  (+) Dental Advisory Given   Pulmonary sleep apnea and Continuous Positive Airway Pressure Ventilation , former smoker   breath sounds clear to auscultation       Cardiovascular hypertension, Pt. on medications (-) angina + CAD   Rhythm:Regular  1. Left ventricular ejection fraction, by estimation, is 60 to 65%. The  left ventricle has normal function. Left ventricular diastolic parameters  were normal.   2. Right ventricular systolic function is normal. The right ventricular  size is normal. Tricuspid regurgitation signal is inadequate for assessing  PA pressure.   3. Left atrial size was mildly dilated.   4. No evidence of mitral valve regurgitation.   5. Aortic valve regurgitation is not visualized.   6. The inferior vena cava IVC not well visualized.     Neuro/Psych  PSYCHIATRIC DISORDERS Anxiety     TIA Neuromuscular disease    GI/Hepatic ,GERD  ,,  Endo/Other  diabetes, Oral Hypoglycemic Agents    Renal/GU CRFRenal disease     Musculoskeletal  (+) Arthritis ,  trauma OF THE LEFT INDEX AND RING FINGERS   Abdominal   Peds  Hematology Lab Results      Component                Value               Date                      WBC                      7.2                 01/11/2023                HGB                      12.8 (L)            01/11/2023                HCT                      39.3                01/11/2023                MCV                      96.3                01/11/2023                PLT                      213                 01/11/2023               Anesthesia Other Findings   Reproductive/Obstetrics  Anesthesia Physical Anesthesia Plan  ASA: 3  Anesthesia Plan: MAC   Post-op Pain Management: Minimal or no pain anticipated   Induction:   PONV Risk Score and Plan: Treatment may vary due to age or medical condition and Ondansetron  Airway Management Planned: Nasal Cannula, Natural Airway and Simple Face Mask  Additional Equipment: None  Intra-op Plan:   Post-operative Plan:   Informed Consent: I have reviewed the patients History and Physical, chart, labs and discussed the procedure including the risks, benefits and alternatives for the proposed anesthesia with the patient or authorized representative who has indicated his/her understanding and acceptance.     Dental advisory given  Plan Discussed with: CRNA  Anesthesia Plan Comments:        Anesthesia Quick Evaluation

## 2023-01-11 NOTE — Discharge Instructions (Signed)
[facility] Post-Operative Instructions    General -These instructions are to compliment information given to you by your surgeon. -You may resume your normal diet as tolerated.  -You may resume your normal medications unless specifically instructed to stop taking a certain medication. -If you are not sure about restarting one of your medications after surgery, please contact the office during normal business hours and we will be able to assist you.   Post-Operative Dressings Please do not remove your surgical dressings. They will be removed at follow up.  If you have questions about your dressings, please call the clinic.   Post-operative Wound Care In general:  Any sutures or anything that will not fall off over time on their own, will be removed in clinic by our staff.  Regardless of any sutures, stickers or glue used to close your wound, do not submerge the wound in any standing water for at least 4 weeks after surgery.  It is okay to let the shower water run over the wound and gently clean the wound with soapy water then rinse and gently pat dry.   Weightbearing/Activity  Do not use your operative extremity for any lifting or weight bearing   Pain Control - After your surgery, post-surgical discomfort or pain is normal. This discomfort can last several days to a few weeks. At certain times of the day (usually evenings/nights) your discomfort may be more intense.  - Do not drive while taking narcotic pain medications.   Nerve Block:  If you received a nerve block, it may provide pain relief for one hour to up to two days after your surgery. As long as the nerve block is working, you will experience little or no sensation in the area the surgeon operated on.  As the nerve block wears off, you will begin to experience pain or discomfort. It is very important that you begin taking your prescribed pain medication before the nerve  block fully wears off.  Treating your pain at the first sign of the block wearing off will ensure your pain is better controlled and more tolerable when full-sensation returns. Do not wait until the pain is intolerable, as the medicine will be less effective. It is better to treat pain in advance than to try and catch up.  If the nerve block made your entire arm numb, you will be given an arm sling that you should wear until the block wears off, but no longer than 2 days unless otherwise instructed.   Pain Medication:  Typically, post operative pain can be managed by alternating tylenol and an anti inflammatory medication.   We may also prescribe an opioid pain medication such as Oxycodone, Percocet (oxycodone with Tylenol) or Norco (hydrocodone with Tylenol) for post-operative pain. Some of these medications contain Tylenol (acetaminophen) in them.  It takes between 30 and 45 minutes before pain medication starts to work. It is important to take your medication before your pain level gets too intense.   Nausea is a common side effect of many pain medications. You will want to eat something before taking your pain medicine to help prevent nausea.   If you are taking a prescription opioid pain medication that contains acetaminophen (Tylenol), we recommend that you do not take additional over the counter acetaminophen (Tylenol).   If you are prescribed oxycodone WITHOUT acetaminophen (Tylenol) in it and you do not have a known allergy, we recommend you take over-the-counter acetaminophen (Tylenol) with  the oxycodone.  Take over-the-counter stool softener such as Colace or Sennakot while taking narcotic pain medications to help prevent constipation.    Other pain relieving options:  Elevation: Elevating your operative extremity can be very helpful to reduce this pain. Prop your arm up on pillows to keep the operative site above the level of your heart. If you develop tingling from prolonged  elevation, take a break from elevating and this should resolve.  Icing: If you do not have a splint/cast, using a cold pack to ice the affected area a few times a day (15 to 20 minutes at a time) can also help to relieve pain, reduce swelling and bruising.   If you can take nonsteroidal anti-inflammatory medications (NSAIDs, for example: Ibuprofen, Advil, Motrin, Aleve, etc.), you may take them to help control your pain. If you are unsure whether you can take anti-inflammatory medications, please check with your primary care provider.  If you are already taking a prescription of anti-inflammatory medications such as Celebrex (celecoxib) or Mobic (meloxicam) you should not take any additional anti-inflammatory drugs such as Ibuprofen, Advil, Motrin, or Aleve.    Follow Up Please call [facility] at [phone] if you do not receive or are unsure of your first follow-up appointment.  You should see your surgeon or PA 10-16 days after your surgery unless otherwise instructed.  No future appointments.   Please call the office for any problems, including the following:  - Excessive redness of the incisions - Drainage for more than 4 days - Fever of more than 101.5 F - Nausea/vomiting that does not stop  - Numbness, tingling, or discoloration of extremity  - Unable to drink fluids  - Uncontrollable pain     Shaune Pollack, MD Hand and Upper Extremity Surgery [facility] [place] [phone]

## 2023-01-11 NOTE — ED Provider Notes (Signed)
[facility] EMERGENCY DEPARTMENT AT [facility] Provider Note   CSN: [id number] Arrival date & time: 01/11/23  1340     History CAD, HTN, GERD  Chief Complaint  Patient presents with   Laceration    Carl Knapp is a 77 y.o. male.  77 y.o male with a PMH HTN, CAD, GERD presents to the ED with a chief complaint of left hand injury s/p table saw.  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  He has not taken any medication for improvement in symptoms.  He last ate around 1:00 today.  He takes a daily aspirin but is currently on no blood thinners. Worsening pain with movement. No other injury reported. Last tetanus per chart review in 2009.   The history is provided by the patient and medical records.  Laceration Associated symptoms: no fever        Home Medications Prior to Admission medications   Medication Sig Start Date End Date Taking? Authorizing Provider  acetaminophen (TYLENOL) 500 MG tablet Take 1,000 mg by mouth daily as needed for moderate pain or headache.    [provider]  amLODipine (NORVASC) 10 MG tablet Take 1 tablet (10 mg total) by mouth daily. 08/10/22   Wanda Plump, MD  aspirin EC 81 MG tablet Take 81 mg by mouth daily.    [provider]  atorvastatin (LIPITOR) 80 MG tablet Take 0.5 tablets (40 mg total) by mouth at bedtime. 11/28/22   Wanda Plump, MD  empagliflozin (JARDIANCE) 10 MG TABS tablet Take 1 tablet (10 mg total) by mouth daily before breakfast. 11/20/22   Wanda Plump, MD  hydrALAZINE (APRESOLINE) 25 MG tablet Take 25 mg by mouth 3 (three) times daily. 10/16/22   [provider]  ketoconazole (NIZORAL) 2 % cream Apply 1 application. topically 2 (two) times daily as needed for irritation (eczema).    Wanda Plump, MD   loratadine (CLARITIN) 10 MG tablet Take 10 mg by mouth daily as needed for allergies.    [provider]  losartan (COZAAR) 100 MG tablet Take 1 tablet (100 mg total) by mouth daily. 11/02/22   Quintella Reichert, MD  metFORMIN (GLUCOPHAGE) 1000 MG tablet Take 1 tablet (1,000 mg total) by mouth 2 (two) times daily with a meal. 09/18/22   Wanda Plump, MD  methocarbamol (ROBAXIN-750) 750 MG tablet Take 1 tablet (750 mg total) by mouth 2 (two) times daily as needed for muscle spasms. Patient not taking: Reported on 11/28/2022 05/12/22   Cristie Hem, PA-C  Multiple Vitamins-Minerals (PRESERVISION AREDS 2) CAPS Take 1 capsule by mouth in the morning and at bedtime.    [provider]  multivitamin (ONE-A-DAY MEN'S) TABS tablet Take 1 tablet by mouth daily.    [provider]  Omega-3 Fatty Acids (FISH OIL) 1000 MG CAPS Take 1 capsule by mouth in the morning and at bedtime.    [provider]  pioglitazone (ACTOS) 45 MG tablet Take 1 tablet (45 mg total) by mouth every morning. 08/07/22   Wanda Plump, MD  tadalafil (CIALIS) 20 MG tablet Take 0.5-1 tablets (10-20 mg total) by mouth every other day as needed for erectile dysfunction. 04/25/22   Wanda Plump, MD  triamcinolone cream (  KENALOG) 0.1 % Apply 1 Application topically 2 (two) times daily as needed (eczema). 07/27/22   Wanda Plump, MD      Allergies    Ozempic (0.25 or 0.5 mg-dose) [semaglutide(0.25 or 0.5mg -dos)] and Simvastatin    Review of Systems   Review of Systems  Constitutional:  Negative for chills and fever.  HENT:  Negative for sore throat.   Respiratory:  Negative for shortness of breath.   Cardiovascular:  Negative for chest pain.  Gastrointestinal:  Negative for abdominal pain, nausea and vomiting.  Genitourinary:  Negative for flank pain.  Musculoskeletal:  Negative for back pain.  Skin:  Positive for wound. Negative for pallor.  Neurological:  Negative for light-headedness and headaches.  All  other systems reviewed and are negative.   Physical Exam Updated Vital Signs BP (!) 167/83   Pulse (!) 50   Temp 97.8 F (36.6 C) (Oral)   Resp 14   Ht 5\' 10"  (1.778 m)   Wt 95.3 kg   SpO2 93%   BMI 30.13 kg/m  Physical Exam Vitals and nursing note reviewed.  Constitutional:      Appearance: Normal appearance.  HENT:     Head: Normocephalic and atraumatic.     Mouth/Throat:     Mouth: Mucous membranes are moist.  Cardiovascular:     Rate and Rhythm: Normal rate.     Pulses:          Radial pulses are 2+ on the right side and 2+ on the left side.     Comments: 2+ radial pulse, index finger amputation, missing DIP to the middle and long finger. Good flexion and extension, sensation is preserved. Unable to test refill but actively bleeding.  Pulmonary:     Effort: Pulmonary effort is normal.  Abdominal:     General: Abdomen is flat.  Musculoskeletal:        General: Deformity present.     Cervical back: Normal range of motion and neck supple.  Skin:    General: Skin is warm and dry.  Neurological:     Mental Status: He is alert and oriented to person, place, and time.          ED Results / Procedures / Treatments   Labs (all labs ordered are listed, but only abnormal results are displayed) Labs Reviewed  CBC WITH DIFFERENTIAL/PLATELET - Abnormal; Notable for the following components:      Result Value   RBC 4.08 (*)    Hemoglobin 12.8 (*)    All other components within normal limits  COMPREHENSIVE METABOLIC PANEL - Abnormal; Notable for the following components:   CO2 21 (*)    Glucose, Bld 119 (*)    BUN 25 (*)    Calcium 8.6 (*)    All other components within normal limits  CBG MONITORING, ED - Abnormal; Notable for the following components:   Glucose-Capillary 118 (*)    All other components within normal limits  GLUCOSE, CAPILLARY    EKG EKG Interpretation Date/Time:  Thursday January 11 2023 14:54:14 EDT Ventricular Rate:  67 PR  Interval:  194 QRS Duration:  116 QT Interval:  452 QTC Calculation: 478 R Axis:   -32  Text Interpretation: Sinus rhythm Ventricular premature complex Nonspecific intraventricular conduction delay No acute changes No significant change since last tracing Confirmed by Derwood Kaplan ([id number]) on 01/11/2023 3:44:11 PM  Radiology DG Hand Complete Left  Result Date: 01/11/2023 CLINICAL DATA:  Injury.  Pain laceration from table saw  EXAM: LEFT HAND - COMPLETE 3 VIEW COMPARISON:  None Available. FINDINGS: Ring artifact obscures the proximal phalanx of fourth digit. Negative ulnar variance. Preserved bone mineralization and joint spaces. There is soft tissue swelling with comminuted fractures involving the distal phalanges of the second, third and fourth digits. The second digit fracture fragments are the most progressively displaced with worsening soft tissue injury but changes are seen of all 3 digits. The fracture involving the distal phalanx of fourth digit does appear to involve the adjacent distal interphalangeal joint as does the second digit. IMPRESSION: Extensive comminuted fractures involving the distal phalanges of the second, third and fourth digits with soft tissue abnormalities. There is suggestion of fracture lines extending to the distal interphalangeal joint at both the third and fourth digits when taking into account all three views. Electronically Signed   By: Karen Kays M.D.   On: 01/11/2023 15:41    Procedures Procedures    Medications Ordered in ED Medications  fentaNYL (SUBLIMAZE) injection 50 mcg (50 mcg Intravenous Given 01/11/23 1433)  ondansetron (ZOFRAN) injection 4 mg (4 mg Intravenous Given 01/11/23 1432)  Tdap (BOOSTRIX) injection 0.5 mL (0.5 mLs Intramuscular Given 01/11/23 1442)  morphine (PF) 4 MG/ML injection 4 mg (4 mg Intravenous Given 01/11/23 1510)  ceFAZolin (ANCEF) IVPB 2g/100 mL premix (0 g Intravenous Stopped 01/11/23 1623)  sodium chloride 0.9 % bolus 500 mL (0 mLs  Intravenous Stopped 01/11/23 1648)    ED Course/ Medical Decision Making/ A&P                                 Medical Decision Making Amount and/or Complexity of Data Reviewed Labs: ordered. Radiology: ordered.  Risk Prescription drug management.    Patient arrives from home for left hand injury with his table saw after trying to make some changes to his deck.  He has an injury to the left index finger, middle finger, ring finger.  There is a partial amputation at the DIP of the right index finger.  Comminuted fractures present to the middle and ring finger.  He is a diabetic CBG checked on arrival at 97.  His last oral intake was approximately at 1 PM this evening with a full meal.  His last immunization according to records was in 2009.  His left wedding ring was removed from the left long finger.  X-rays were obtained and orthopedic surgery hand APP Charma Igo was called, he evaluated the patient at the bedside, care of this patient was discussed between him and Dr. Sharlette Dense, hand specialist on-call.  We were given recommendations to primarily close this while in the emergency department.  My attending Dr. Rhunette Croft has spoke to Dr. Glena Norfolk as we felt a repair was out of our scope of practice. He has been asked to come into the emergency department to evaluate the patient. Patient and family have been updated on plan and management.   6:15 PM patient was evaluated by hand specialist at the bedside, he was taking up to the operating room in order to have incision and drainage of his left index finger.  He remained in stable condition while in the emergency department.   Portions of this note were generated with Scientist, clinical (histocompatibility and immunogenetics). Dictation errors may occur despite best attempts at proofreading.   Final Clinical Impression(s) / ED Diagnoses Final diagnoses:  Hand injury, left, initial encounter    Rx / DC Orders ED Discharge  Orders     None         Claude Manges, PA-C 01/11/23 1815    Derwood Kaplan, MD 01/12/23 [phone]

## 2023-01-11 NOTE — H&P (Signed)
Orthopaedic Surgery Hand and Upper Extremity History and Physical Examination 01/11/2023  Referring Provider: No referring provider defined for this encounter.  CC: Left index, long, and ring finger table saw injury  HPI: Carl Knapp is a 77 y.o. male who sustained a table saw injury to the nondominant hand to the index, long, and ring distal phalanges.  He presented with open fractures of all fingers.   Past Medical History: Past Medical History:  Diagnosis Date   Allergy    Anxiety    Aortic atherosclerosis (HCC)    Arthritis    Atrial tachycardia, paroxysmal    Noted to have up to 14 beats in a row of atrial tachycardia on event monitor for/2023   Chronic kidney disease    Coronary artery disease 12/2013   20-30% RCA by cath- states was good.   Diabetes mellitus without complication (HCC)    type II   Drug-induced myopathy    DVT (deep venous thrombosis) (HCC)    bilateral legs-greater on left. -3-5 years ago-tx. coumadin x 1 year.   Eczema    Erectile dysfunction    GERD (gastroesophageal reflux disease)    Hx of cardiovascular stress test    ETT-Myoview (7/15):  ECG with ST depression; freq PVCs, + chest pain; normal perfusion   Hyperlipidemia    LDL goal<100   Hypertension    Macular degeneration    mild   OSA (obstructive sleep apnea)    Severe w AHI 37/hr now on CPAP at 10cm H2O   PVC's (premature ventricular contractions)    Isolated PVCs, ventricular couplets and triplets, bigeminal and trigeminal PVCs with PVC load less than 1% on event monitor 08/2021   Sleep apnea Use CPAP     Medications: Scheduled Meds:  sodium bicarbonate       Continuous Infusions:  lactated ringers 10 mL/hr at 01/11/23 1821   PRN Meds:.0.9 % irrigation (POUR BTL), sodium bicarbonate, sodium chloride irrigation  Allergies: Allergies as of 01/11/2023 - Review Complete 01/11/2023  Allergen Reaction Noted   Ozempic (0.25 or 0.5 mg-dose) [semaglutide(0.25 or 0.5mg -dos)]   09/23/2021   Simvastatin  06/27/2021    Past Surgical History: Past Surgical History:  Procedure Laterality Date   APPENDECTOMY     open '68   HERNIA REPAIR Bilateral 1980   inguinal   INSERTION OF MESH N/A 06/14/2017   Procedure: INSERTION OF MESH;  Surgeon: Ovidio Kin, MD;  Location: [facility] ORS;  Service: General;  Laterality: N/A;   LEFT HEART CATHETERIZATION WITH CORONARY ANGIOGRAM N/A 12/09/2013   Procedure: LEFT HEART CATHETERIZATION WITH CORONARY ANGIOGRAM;  Surgeon: Corky Crafts, MD;  [facility] C[facility] LAB;  Service: Cardiovascular;  Laterality: N/A;   TONSILLECTOMY     UMBILICAL HERNIA REPAIR N/A 06/14/2017   Procedure: LAPAROSCOPIC UMBILICAL HERNIA REPAIR WITH MESH ERAS PATHWAY;  Surgeon: Ovidio Kin, MD;  Location: [facility] ORS;  Service: General;  Laterality: N/A;  ERAS PATHWAY   VASECTOMY       Social History: Social History   Occupational History   Occupation: retired- Primary school teacher  Tobacco Use   Smoking status: Former    Current packs/day: 0.00    Average packs/day: 0.5 packs/day for 20.0 years (10.0 ttl pk-yrs)    Types: Cigarettes    Start date: 06/08/1990    Quit date: 06/08/2010    Years since quitting: 12.6   Smokeless tobacco: Never   Tobacco comments:    < 1/2   Vaping Use   Vaping status: Never  Used  Substance and Sexual Activity   Alcohol use: Yes    Alcohol/week: 2.0 standard drinks of alcohol    Types: 1 Glasses of wine, 1 Cans of beer per week    Comment: rare beer or glass of wine   Drug use: No   Sexual activity: Yes     Family History: Family History  Problem Relation Age of Onset   Colon cancer Mother    CAD Father    AAA (abdominal aortic aneurysm) Father    Heart attack Father    Arthritis Father    Lung cancer Father    Hypertension Father    Lung cancer Maternal Grandmother    Stroke Maternal Grandfather 24   Cancer Paternal Grandmother    Heart attack Paternal Grandfather 32   Hypertension Other    Prostate cancer Neg  Hx    Otherwise, no relevant orthopaedic family history  ROS: Review of Systems: All systems reviewed and are negative except that mentioned in HPI  Work/Sport/Hobbies: See HPI  Physical Examination: Vitals:   01/11/23 1749 01/11/23 1814  BP:  (!) 166/95  Pulse:  76  Resp:  16  Temp: 97.9 F (36.6 C) 97.8 F (36.6 C)  SpO2:  93%   Constitutional: Awake, alert.  WN/WD Appearance: healthy, no acute distress, well-groomed Affect: Normal HEENT: EOMI, mucous membranes moist CV: RRR Pulm: breathing comfortably  Neck:   FROM, no pain  Left Upper Extremity / Hand Distal tips of left index, long, and ring fingers with lacerations and open fractures.  Pertinent Labs: n/a  Imaging: I have personally reviewed the following studies: Plain films were reviewed and demonstrate fractures of the distal phalanges of index, long, and ring.  Additional Studies: n/a  Assessment/Plan: Hand injury, left, initial encounter  Plan to take to OR for revision amp and I&D  Shaune Pollack, MD Hand and Upper Extremity Surgery [facility] [phone] 01/11/2023 6:34 PM

## 2023-01-11 NOTE — Anesthesia Procedure Notes (Signed)
Procedure Name: MAC Date/Time: 01/11/2023 6:44 PM  Performed by: Ludwig Lean, CRNAPre-anesthesia Checklist: Patient identified, Emergency Drugs available, Suction available and Patient being monitored Patient Re-evaluated:Patient Re-evaluated prior to induction Oxygen Delivery Method: Simple face mask Preoxygenation: Pre-oxygenation with 100% oxygen Placement Confirmation: positive ETCO2 and breath sounds checked- equal and bilateral

## 2023-01-11 NOTE — ED Triage Notes (Signed)
Pt presents to ED from home after table saw injury involving L hand. Injuries to L middle, ring, and little fingers. Oozing blood noted during triage. Pt reports feeling faint. Taken immediately to room. MD/PA  made aware of pt.

## 2023-01-11 NOTE — Consult Note (Signed)
Reason for Consult:Left hand injury Referring Physician: Derwood Kaplan Time called: 1431 Time at bedside: 1444   Carl Knapp is an 77 y.o. male.  HPI: Flavel was working on his deck and using a table saw. His left hand got caught and he badly cut his index, long, and ring fingers. He was brought to the ED and hand surgery was consulted. He is RHD and is retired.  Past Medical History:  Diagnosis Date   Allergy    Anxiety    Aortic atherosclerosis (HCC)    Arthritis    Atrial tachycardia, paroxysmal    Noted to have up to 14 beats in a row of atrial tachycardia on event monitor for/2023   Chronic kidney disease    Coronary artery disease 12/2013   20-30% RCA by cath- states was good.   Diabetes mellitus without complication (HCC)    type II   Drug-induced myopathy    DVT (deep venous thrombosis) (HCC)    bilateral legs-greater on left. -3-5 years ago-tx. coumadin x 1 year.   Eczema    Erectile dysfunction    GERD (gastroesophageal reflux disease)    Hx of cardiovascular stress test    ETT-Myoview (7/15):  ECG with ST depression; freq PVCs, + chest pain; normal perfusion   Hyperlipidemia    LDL goal<100   Hypertension    Macular degeneration    mild   OSA (obstructive sleep apnea)    Severe w AHI 37/hr now on CPAP at 10cm H2O   PVC's (premature ventricular contractions)    Isolated PVCs, ventricular couplets and triplets, bigeminal and trigeminal PVCs with PVC load less than 1% on event monitor 08/2021   Sleep apnea Use CPAP    Past Surgical History:  Procedure Laterality Date   APPENDECTOMY     open '68   HERNIA REPAIR Bilateral 1980   inguinal   INSERTION OF MESH N/A 06/14/2017   Procedure: INSERTION OF MESH;  Surgeon: Ovidio Kin, MD;  Location: [facility];  Service: General;  Laterality: N/A;   LEFT HEART CATHETERIZATION WITH CORONARY ANGIOGRAM N/A 12/09/2013   Procedure: LEFT HEART CATHETERIZATION WITH CORONARY ANGIOGRAM;  Surgeon: Corky Crafts, MD;   Location: [facility] CATH LAB;  Service: Cardiovascular;  Laterality: N/A;   TONSILLECTOMY     UMBILICAL HERNIA REPAIR N/A 06/14/2017   Procedure: LAPAROSCOPIC UMBILICAL HERNIA REPAIR WITH MESH ERAS PATHWAY;  Surgeon: Ovidio Kin, MD;  Location: [facility];  Service: General;  Laterality: N/A;  ERAS PATHWAY   VASECTOMY      Family History  Problem Relation Age of Onset   Colon cancer Mother    CAD Father    AAA (abdominal aortic aneurysm) Father    Heart attack Father    Arthritis Father    Lung cancer Father    Hypertension Father    Lung cancer Maternal Grandmother    Stroke Maternal Grandfather 23   Cancer Paternal Grandmother    Heart attack Paternal Grandfather 1   Hypertension Other    Prostate cancer Neg Hx     Social History:  reports that he quit smoking about 12 years ago. His smoking use included cigarettes. He started smoking about 32 years ago. He has a 10 pack-year smoking history. He has never used smokeless tobacco. He reports current alcohol use of about 2.0 standard drinks of alcohol per week. He reports that he does not use drugs.  Allergies:  Allergies  Allergen Reactions   Ozempic (0.25 Or 0.5 Mg-Dose) [Semaglutide(0.25  Or 0.5mg -Dos)]     Was RX 2018 in the context of a research study. Caused irreg heart beat, stomach issues. No rash-itching-tongue swelling    Simvastatin     Other reaction(s): myalgias    Medications: I have reviewed the patient's current medications.  Results for orders placed or performed during the hospital encounter of 01/11/23 (from the past 48 hour(s))  POC CBG, ED     Status: Abnormal   Collection Time: 01/11/23  2:36 PM  Result Value Ref Range   Glucose-Capillary 118 (H) 70 - 99 mg/dL    Comment: Glucose reference range applies only to samples taken after fasting for at least 8 hours.    No results found.  Review of Systems  HENT:  Negative for ear discharge, ear pain, hearing loss and tinnitus.   Eyes:  Negative for photophobia  and pain.  Respiratory:  Negative for cough and shortness of breath.   Cardiovascular:  Negative for chest pain.  Gastrointestinal:  Negative for abdominal pain, nausea and vomiting.  Genitourinary:  Negative for dysuria, flank pain, frequency and urgency.  Musculoskeletal:  Positive for arthralgias (Left middle fingers). Negative for back pain, myalgias and neck pain.  Neurological:  Negative for dizziness and headaches.  Hematological:  Does not bruise/bleed easily.  Psychiatric/Behavioral:  The patient is not nervous/anxious.    Blood pressure (!) 169/75, pulse 69, temperature (!) 97.5 F (36.4 C), temperature source Oral, resp. rate 18, height 5\' 10"  (1.778 m), weight 99.8 kg, SpO2 96%. Physical Exam Constitutional:      General: He is not in acute distress.    Appearance: He is well-developed. He is not diaphoretic.  HENT:     Head: Normocephalic and atraumatic.  Eyes:     General: No scleral icterus.       Right eye: No discharge.        Left eye: No discharge.     Conjunctiva/sclera: Conjunctivae normal.  Cardiovascular:     Rate and Rhythm: Normal rate and regular rhythm.  Pulmonary:     Effort: Pulmonary effort is normal. No respiratory distress.  Musculoskeletal:     Cervical back: Normal range of motion.     Comments: Left shoulder, elbow, wrist, digits- Partial amputation index P3, significant volar lacerations long and ring P3, mod TTP, no instability, no blocks to motion  Sens  Ax/R/M/U intact  Mot   Ax/ R/ PIN/ M/ AIN/ U intact  Rad 2+  Skin:    General: Skin is warm and dry.  Neurological:     Mental Status: He is alert.  Psychiatric:        Mood and Affect: Mood normal.        Behavior: Behavior normal.     Assessment/Plan: Left hand injury -- Plan ED staff to I&D and close skin as best as possible. D/c on Keflex +/- aluminum splints. F/u with Dr. Kerry Fort next week to discuss revision amputation index finger.    Freeman Caldron, PA-C Orthopedic  Surgery [phone] 01/11/2023, 2:51 PM

## 2023-01-11 NOTE — Op Note (Signed)
NAME: Carl Knapp [facility] MEDICAL RECORD NO: [id number] DATE OF BIRTH: 06/15/1945 FACILITY: [facility] LOCATION: [facility] PHYSICIAN: Ramon Dredge MD   OPERATIVE REPORT   DATE OF PROCEDURE: 01/11/23    PREOPERATIVE DIAGNOSIS:  Left index, long, and ring finger table saw injury   POSTOPERATIVE DIAGNOSIS:  same   PROCEDURE:  Revision amputation of the left index finger.I&D open fracture of the long finger and ring finger with complex wound closures.     SURGEON: Edsel Petrin. Cainen Burnham, M.D.   ASSISTANT: none   ANESTHESIA:  Local   INTRAVENOUS FLUIDS:  Per anesthesia flow sheet.   ESTIMATED BLOOD LOSS:  Minimal.   COMPLICATIONS:  None.   SPECIMENS:  none   TOURNIQUET TIME:   Index finger 17 minutes Ring finger 16 minutes   DISPOSITION:  Stable to PACU.   INDICATIONS:  77 year old male who sustained table saw injury to the left index, long, and ring fingers.  OPERATIVE COURSE:  Patient was identified in holding.  Site, side, and surgery were confirmed and consent obtained.  The surgical sites were marked.  Digital blocks of 4cc split between the index and radial aspect of the long and 4cc between the ulnar aspect of the long and ring fingers were injected.  The mixtures were a 9:1 ratio of 1% local with epi 1:100,000 and 8.4% sodium bicarb.  The patient was taken to the OR and transferred to OR table in supine position.  Left hand was prepped and draped with betadine in typical sterile fashion.  The wounds were irrigated with 3L normal saline.  A penrose tourniquet was tied on the index finger.  Given the extent of the soft tissue trauma of the index finger, the decision was made to revise the amputation.  The bone was debrided with a rongeur down to the DIP joint and non-viable skin and soft tissue were sharply excised.  The skin was then closed with 5-0 nylons.  The tourniquet was removed.    Attention was turned to the ring finger.  A penrose tourniquet was applied.  The  fracture was sharply debrided with rongeur.  The nail bed was lacerated at the germinal matrix.  Non-viable tissue was sharply debrided with 15 blade and tenotomies.  Hemostasis achieved with bipolar electrocautery.  The complex wound was then closed with 4-0 chromic gut at the nail bed/germinal matrix and lateral nail bed to skin due to soft tissue loss and then 5-0 nylons on the volar surface of the lacerations.  The tourniquet was then removed.  The long finger was then evaluated.  The fracture site was sharply debrided with rongeurs.  The complex laceration was then closed with 5-0 nylons.    The wounds were then covered with xeroform, then a bulky dressing was applied of cling wrap, gauze, kerlix and an ACE bandage.  POST OP PLAN:  Remain non-weight bearing on left hand.  Keep dressings on until follow up in 1 week in clinic.  Sutures will likely stay in for 2 weeks or more.  Absorbable sutures will not be removed.     Ramon Dredge, MD Electronically signed, 01/11/23

## 2023-01-11 NOTE — Transfer of Care (Signed)
Immediate Anesthesia Transfer of Care Note  Patient: Carl Knapp [facility]  Procedure(s) Performed: Procedure(s): INCISION AND DRAINAGE LEFT INDEX FINGER AND LEFT RING FINGER (Left)  Patient Location: PACU  Anesthesia Type:MAC  Level of Consciousness: Patient easily awoken, sedated, comfortable, cooperative, following commands, responds to stimulation.   Airway & Oxygen Therapy: Patient spontaneously breathing, ventilating well, oxygen via simple oxygen mask.  Post-op Assessment: Report given to PACU RN, vital signs reviewed and stable, moving all extremities.   Post vital signs: Reviewed and stable.  Complications: No apparent anesthesia complications  Last Vitals:  Vitals Value Taken Time  BP    Temp    Pulse 70 01/11/23 2010  Resp    SpO2 95 % 01/11/23 2010  Vitals shown include unfiled device data.  Last Pain:  Vitals:   01/11/23 1814  TempSrc: Oral  PainSc:          Complications: No notable events documented.

## 2023-01-12 ENCOUNTER — Encounter (HOSPITAL_COMMUNITY): Payer: Self-pay | Admitting: Orthopedic Surgery

## 2023-01-15 DIAGNOSIS — E1122 Type 2 diabetes mellitus with diabetic chronic kidney disease: Secondary | ICD-10-CM | POA: Diagnosis not present

## 2023-01-15 DIAGNOSIS — I129 Hypertensive chronic kidney disease with stage 1 through stage 4 chronic kidney disease, or unspecified chronic kidney disease: Secondary | ICD-10-CM | POA: Diagnosis not present

## 2023-01-15 DIAGNOSIS — R809 Proteinuria, unspecified: Secondary | ICD-10-CM | POA: Diagnosis not present

## 2023-01-15 DIAGNOSIS — N1831 Chronic kidney disease, stage 3a: Secondary | ICD-10-CM | POA: Diagnosis not present

## 2023-01-15 DIAGNOSIS — E559 Vitamin D deficiency, unspecified: Secondary | ICD-10-CM | POA: Diagnosis not present

## 2023-01-16 DIAGNOSIS — S65513A Laceration of blood vessel of left middle finger, initial encounter: Secondary | ICD-10-CM | POA: Diagnosis not present

## 2023-01-16 DIAGNOSIS — W312XXA Contact with powered woodworking and forming machines, initial encounter: Secondary | ICD-10-CM | POA: Diagnosis not present

## 2023-01-16 DIAGNOSIS — S68111A Complete traumatic metacarpophalangeal amputation of left index finger, initial encounter: Secondary | ICD-10-CM | POA: Diagnosis not present

## 2023-01-16 DIAGNOSIS — S65515A Laceration of blood vessel of left ring finger, initial encounter: Secondary | ICD-10-CM | POA: Diagnosis not present

## 2023-01-16 DIAGNOSIS — M79642 Pain in left hand: Secondary | ICD-10-CM | POA: Diagnosis not present

## 2023-01-16 DIAGNOSIS — M25642 Stiffness of left hand, not elsewhere classified: Secondary | ICD-10-CM | POA: Diagnosis not present

## 2023-01-18 ENCOUNTER — Encounter: Payer: Self-pay | Admitting: Internal Medicine

## 2023-01-18 NOTE — Anesthesia Postprocedure Evaluation (Signed)
Anesthesia Post Note  Patient: Carl Knapp  Procedure(s) Performed: INCISION AND DRAINAGE LEFT INDEX FINGER AND LEFT RING FINGER (Left)     Patient location during evaluation: PACU Anesthesia Type: MAC Level of consciousness: awake and alert Pain management: pain level controlled Vital Signs Assessment: post-procedure vital signs reviewed and stable Respiratory status: spontaneous breathing, nonlabored ventilation and respiratory function stable Cardiovascular status: stable and blood pressure returned to baseline Postop Assessment: no apparent nausea or vomiting Anesthetic complications: no   No notable events documented.  Last Vitals:  Vitals:   01/11/23 2045 01/11/23 2105  BP: (!) 155/70 (!) 153/76  Pulse: 71 73  Resp: 20 17  Temp: 36.6 C 36.6 C  SpO2: 95% 97%    Last Pain:  Vitals:   01/11/23 2105  TempSrc:   PainSc: 0-No pain                 Keeon Zurn

## 2023-01-22 DIAGNOSIS — M25642 Stiffness of left hand, not elsewhere classified: Secondary | ICD-10-CM | POA: Diagnosis not present

## 2023-01-22 DIAGNOSIS — M79642 Pain in left hand: Secondary | ICD-10-CM | POA: Diagnosis not present

## 2023-01-24 DIAGNOSIS — S68111A Complete traumatic metacarpophalangeal amputation of left index finger, initial encounter: Secondary | ICD-10-CM | POA: Diagnosis not present

## 2023-01-24 DIAGNOSIS — M79642 Pain in left hand: Secondary | ICD-10-CM | POA: Diagnosis not present

## 2023-01-24 DIAGNOSIS — S65515A Laceration of blood vessel of left ring finger, initial encounter: Secondary | ICD-10-CM | POA: Diagnosis not present

## 2023-01-24 DIAGNOSIS — M25642 Stiffness of left hand, not elsewhere classified: Secondary | ICD-10-CM | POA: Diagnosis not present

## 2023-01-24 DIAGNOSIS — S65513A Laceration of blood vessel of left middle finger, initial encounter: Secondary | ICD-10-CM | POA: Diagnosis not present

## 2023-01-29 DIAGNOSIS — F331 Major depressive disorder, recurrent, moderate: Secondary | ICD-10-CM | POA: Diagnosis not present

## 2023-01-31 ENCOUNTER — Other Ambulatory Visit: Payer: Self-pay | Admitting: Internal Medicine

## 2023-01-31 ENCOUNTER — Telehealth: Payer: Self-pay | Admitting: *Deleted

## 2023-01-31 DIAGNOSIS — S65515A Laceration of blood vessel of left ring finger, initial encounter: Secondary | ICD-10-CM | POA: Diagnosis not present

## 2023-01-31 DIAGNOSIS — M25642 Stiffness of left hand, not elsewhere classified: Secondary | ICD-10-CM | POA: Diagnosis not present

## 2023-01-31 DIAGNOSIS — R600 Localized edema: Secondary | ICD-10-CM | POA: Diagnosis not present

## 2023-01-31 DIAGNOSIS — S65513A Laceration of blood vessel of left middle finger, initial encounter: Secondary | ICD-10-CM | POA: Diagnosis not present

## 2023-01-31 DIAGNOSIS — M79642 Pain in left hand: Secondary | ICD-10-CM | POA: Diagnosis not present

## 2023-01-31 DIAGNOSIS — S68111A Complete traumatic metacarpophalangeal amputation of left index finger, initial encounter: Secondary | ICD-10-CM | POA: Diagnosis not present

## 2023-01-31 DIAGNOSIS — W312XXA Contact with powered woodworking and forming machines, initial encounter: Secondary | ICD-10-CM | POA: Diagnosis not present

## 2023-01-31 DIAGNOSIS — T148XXA Other injury of unspecified body region, initial encounter: Secondary | ICD-10-CM | POA: Diagnosis not present

## 2023-01-31 NOTE — Telephone Encounter (Signed)
Patient called to say his cpap stopped working last night and when he called [employer] he was told he could not get a new one until  February 2025 but he could rent one for $50 until then. He would like to have a new one since his quit working and is only 77 years old. I will reach out to [employer] and he is going to take his unit to the retail office to be looked at on Monday. He fills he can not live without his cpap and he uses it every night.

## 2023-02-03 ENCOUNTER — Other Ambulatory Visit: Payer: Self-pay | Admitting: Internal Medicine

## 2023-02-07 DIAGNOSIS — M25642 Stiffness of left hand, not elsewhere classified: Secondary | ICD-10-CM | POA: Diagnosis not present

## 2023-02-07 DIAGNOSIS — T148XXA Other injury of unspecified body region, initial encounter: Secondary | ICD-10-CM | POA: Diagnosis not present

## 2023-02-07 DIAGNOSIS — R600 Localized edema: Secondary | ICD-10-CM | POA: Diagnosis not present

## 2023-02-07 DIAGNOSIS — M79642 Pain in left hand: Secondary | ICD-10-CM | POA: Diagnosis not present

## 2023-02-09 DIAGNOSIS — S65513A Laceration of blood vessel of left middle finger, initial encounter: Secondary | ICD-10-CM | POA: Diagnosis not present

## 2023-02-09 DIAGNOSIS — S68111A Complete traumatic metacarpophalangeal amputation of left index finger, initial encounter: Secondary | ICD-10-CM | POA: Diagnosis not present

## 2023-02-09 DIAGNOSIS — S65515A Laceration of blood vessel of left ring finger, initial encounter: Secondary | ICD-10-CM | POA: Diagnosis not present

## 2023-02-13 DIAGNOSIS — S68111A Complete traumatic metacarpophalangeal amputation of left index finger, initial encounter: Secondary | ICD-10-CM | POA: Diagnosis not present

## 2023-02-13 DIAGNOSIS — S65513D Laceration of blood vessel of left middle finger, subsequent encounter: Secondary | ICD-10-CM | POA: Diagnosis not present

## 2023-02-13 DIAGNOSIS — T148XXA Other injury of unspecified body region, initial encounter: Secondary | ICD-10-CM | POA: Diagnosis not present

## 2023-02-13 DIAGNOSIS — M79642 Pain in left hand: Secondary | ICD-10-CM | POA: Diagnosis not present

## 2023-02-13 DIAGNOSIS — M25642 Stiffness of left hand, not elsewhere classified: Secondary | ICD-10-CM | POA: Diagnosis not present

## 2023-02-13 DIAGNOSIS — S65515D Laceration of blood vessel of left ring finger, subsequent encounter: Secondary | ICD-10-CM | POA: Diagnosis not present

## 2023-02-13 DIAGNOSIS — W312XXA Contact with powered woodworking and forming machines, initial encounter: Secondary | ICD-10-CM | POA: Diagnosis not present

## 2023-02-13 DIAGNOSIS — R600 Localized edema: Secondary | ICD-10-CM | POA: Diagnosis not present

## 2023-03-01 DIAGNOSIS — R600 Localized edema: Secondary | ICD-10-CM | POA: Diagnosis not present

## 2023-03-01 DIAGNOSIS — M79642 Pain in left hand: Secondary | ICD-10-CM | POA: Diagnosis not present

## 2023-03-01 DIAGNOSIS — M25642 Stiffness of left hand, not elsewhere classified: Secondary | ICD-10-CM | POA: Diagnosis not present

## 2023-03-01 DIAGNOSIS — Z23 Encounter for immunization: Secondary | ICD-10-CM | POA: Diagnosis not present

## 2023-03-06 DIAGNOSIS — S65515D Laceration of blood vessel of left ring finger, subsequent encounter: Secondary | ICD-10-CM | POA: Diagnosis not present

## 2023-03-06 DIAGNOSIS — S65513D Laceration of blood vessel of left middle finger, subsequent encounter: Secondary | ICD-10-CM | POA: Diagnosis not present

## 2023-03-06 DIAGNOSIS — S68111A Complete traumatic metacarpophalangeal amputation of left index finger, initial encounter: Secondary | ICD-10-CM | POA: Diagnosis not present

## 2023-03-30 DIAGNOSIS — S65513D Laceration of blood vessel of left middle finger, subsequent encounter: Secondary | ICD-10-CM | POA: Diagnosis not present

## 2023-03-30 DIAGNOSIS — S65515D Laceration of blood vessel of left ring finger, subsequent encounter: Secondary | ICD-10-CM | POA: Diagnosis not present

## 2023-03-30 DIAGNOSIS — S68111A Complete traumatic metacarpophalangeal amputation of left index finger, initial encounter: Secondary | ICD-10-CM | POA: Diagnosis not present

## 2023-04-09 ENCOUNTER — Encounter: Payer: Self-pay | Admitting: Internal Medicine

## 2023-04-09 ENCOUNTER — Ambulatory Visit (INDEPENDENT_AMBULATORY_CARE_PROVIDER_SITE_OTHER): Payer: Medicare Other | Admitting: Internal Medicine

## 2023-04-09 VITALS — BP 134/80 | HR 76 | Temp 98.1°F | Resp 16 | Ht 70.0 in | Wt 229.4 lb

## 2023-04-09 DIAGNOSIS — E1169 Type 2 diabetes mellitus with other specified complication: Secondary | ICD-10-CM | POA: Diagnosis not present

## 2023-04-09 DIAGNOSIS — Z7984 Long term (current) use of oral hypoglycemic drugs: Secondary | ICD-10-CM

## 2023-04-09 DIAGNOSIS — R972 Elevated prostate specific antigen [PSA]: Secondary | ICD-10-CM

## 2023-04-09 LAB — PSA: PSA: 1.58 ng/mL (ref 0.10–4.00)

## 2023-04-09 LAB — HEMOGLOBIN A1C: Hgb A1c MFr Bld: 6.9 % — ABNORMAL HIGH (ref 4.6–6.5)

## 2023-04-09 NOTE — Patient Instructions (Addendum)
Vaccines I recommend: Covid booster   Check the  blood pressure regularly Blood pressure goal:  between 110/65 and  135/85. If it is consistently higher or lower, let me know     GO TO THE LAB : Get the blood work     Next visit with me in 4 months Please schedule it at the front desk

## 2023-04-09 NOTE — Assessment & Plan Note (Signed)
DM: Good compliance with Jardiance, metformin, pioglitazone.  Ambulatory CBGs in the low 100s.  Denies lower extremity paresthesias, feet exam negative, check A1c. + Microalbumin: Per nephrology. High cholesterol: Well-controlled on Lipitor 40 mg. HTN: Ambulatory BP 130s, 140s.  Last BMP stable, continue amlodipine, hydralazine, losartan. CAD  S/p Cardiac PET/CT.  LV perfusion normal.  EF 55%.  Coronary calcium absent. Low risk. PSA velocity was slightly elevated, no symptoms, check PSA Vaccine advised: To have COVID booster tomorrow RTC 4 months

## 2023-04-09 NOTE — Progress Notes (Signed)
Subjective:    Patient ID: Carl Knapp, male    DOB: [birth year], 77 y.o.   MRN: [id number]  DOS:  04/09/2023 Type of visit - description: f/u  Since last visit is doing well other than the partial loss of the left index due to an accident.  Review of Systems See above   Past Medical History:  Diagnosis Date   Allergy    Anxiety    Aortic atherosclerosis (HCC)    Arthritis    Atrial tachycardia, paroxysmal (HCC)    Noted to have up to 14 beats in a row of atrial tachycardia on event monitor for/2023   Chronic kidney disease    Coronary artery disease 12/2013   20-30% RCA by cath- states was good.   Diabetes mellitus without complication (HCC)    type II   Drug-induced myopathy    DVT (deep venous thrombosis) (HCC)    bilateral legs-greater on left. -3-5 years ago-tx. coumadin x 1 year.   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   L   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    Past Surgical History:  Procedure Laterality Date   AMPUTATION FINGER Left 01/11/2023   index and ring finger   APPENDECTOMY     open '68   HERNIA REPAIR Bilateral 1980   inguinal   INCISION AND DRAINAGE Left 01/11/2023   Procedure: INCISION AND DRAINAGE LEFT INDEX FINGER AND LEFT RING FINGER;  Surgeon: Ramon Dredge, MD;  Location: [facility];  Service: Orthopedics;  Laterality: Left;   INSERTION OF MESH N/A  06/14/2017   Procedure: INSERTION OF MESH;  Surgeon: Ovidio Kin, MD;  Location: [facility];  Service: General;  Laterality: N/A;   LEFT HEART CATHETERIZATION WITH CORONARY ANGIOGRAM N/A 12/09/2013   Procedure: LEFT HEART CATHETERIZATION WITH CORONARY ANGIOGRAM;  Surgeon: Corky Crafts, MD;  Location: [facility] CATH LAB;  Service: Cardiovascular;  Laterality: N/A;   TONSILLECTOMY     UMBILICAL HERNIA REPAIR N/A 06/14/2017   Procedure: LAPAROSCOPIC UMBILICAL HERNIA REPAIR WITH MESH ERAS PATHWAY;  Surgeon: Ovidio Kin, MD;  Location: [facility];  Service: General;  Laterality: N/A;  ERAS PATHWAY   VASECTOMY      Current Outpatient Medications  Medication Instructions   acetaminophen (TYLENOL) 1,000 mg, Oral, Daily PRN   amLODipine (NORVASC) 10 mg, Oral, Daily   aspirin EC 81 mg, Daily   atorvastatin (LIPITOR) 40 MG tablet TAKE 1.5 TABLETS BY MOUTH DAILY.   atorvastatin (LIPITOR) 80 MG tablet TAKE 1 TABLET BY MOUTH EVERYDAY AT BEDTIME   empagliflozin (JARDIANCE) 10 mg, Oral, Daily before breakfast   hydrALAZINE (APRESOLINE) 25 mg, Oral, 3 times daily   ketoconazole (NIZORAL) 2 % cream 1 application , Topical, 2 times daily PRN   loratadine (CLARITIN) 10 mg, Oral, Daily PRN   losartan (COZAAR) 100  mg, Oral, Daily   metFORMIN (GLUCOPHAGE) 1000 MG tablet TAKE 1 TABLET (1,000 MG TOTAL) BY MOUTH TWICE A DAY WITH FOOD   methocarbamol (ROBAXIN-750) 750 mg, Oral, 2 times daily PRN   Multiple Vitamins-Minerals (PRESERVISION AREDS 2) CAPS 1 capsule, Oral, 2 times daily   multivitamin (ONE-A-DAY MEN'S) TABS tablet 1 tablet, Oral, Daily   Omega-3 Fatty Acids (FISH OIL) 1000 MG CAPS 1 capsule, Oral, 2 times daily   oxyCODONE (ROXICODONE) 5 mg, Oral, Every 6 hours PRN   pioglitazone (ACTOS) 45 mg, Oral, Every morning   tadalafil (CIALIS) 10-20 mg, Oral, Every 48 hours PRN   triamcinolone cream (KENALOG) 0.1 % 1 Application, Topical, 2 times daily PRN       Objective:   Physical Exam BP 134/80   Pulse  76   Temp 98.1 F (36.7 C) (Oral)   Resp 16   Ht 5\' 10"  (1.778 m)   Wt 229 lb 6 oz (104 kg)   SpO2 95%   BMI 32.91 kg/m  General:   Well developed, NAD, BMI noted. HEENT:  Normocephalic . Face symmetric, atraumatic Lungs:  CTA B Normal respiratory effort, no intercostal retractions, no accessory muscle use. Heart: RRR,  no murmur.  DM foot exam: No edema, good pedal pulses, pinprick examination normal Skin: Not pale. Not jaundice Neurologic:  alert & oriented X3.  Speech normal, gait appropriate for age and unassisted Psych--  Cognition and judgment appear intact.  Cooperative with normal attention span and concentration.  Behavior appropriate. No anxious or depressed appearing.      Assessment     Assessment  (new patient 09/2021, previous PCP retiring) DM dx ~ 2010 + Microalbumin: Refer to nephrology 07-2022 HTN.  Beta-blocker intolerant and per cardiology note 10/2022. High cholesterol CV: --- CAD, dr turner  ---TIA (?)  June 2023 ---SVT OSA, Cpap, Dr Mayford Knife  GERD (remotely) Macular degeneration H/o DVT  PLAN DM: Good compliance with Jardiance, metformin, pioglitazone.  Ambulatory CBGs in the low 100s.  Denies lower extremity paresthesias, feet exam negative, check A1c. + Microalbumin: Per nephrology. High cholesterol: Well-controlled on Lipitor 40 mg. HTN: Ambulatory BP 130s, 140s.  Last BMP stable, continue amlodipine, hydralazine, losartan. CAD  S/p Cardiac PET/CT.  LV perfusion normal.  EF 55%.  Coronary calcium absent. Low risk. PSA velocity was slightly elevated, no symptoms, check PSA Vaccine advised: To have COVID booster tomorrow RTC 4 months

## 2023-04-10 DIAGNOSIS — Z23 Encounter for immunization: Secondary | ICD-10-CM | POA: Diagnosis not present

## 2023-05-02 ENCOUNTER — Other Ambulatory Visit: Payer: Self-pay | Admitting: Internal Medicine

## 2023-05-25 ENCOUNTER — Other Ambulatory Visit: Payer: Self-pay | Admitting: Internal Medicine

## 2023-06-22 DIAGNOSIS — E119 Type 2 diabetes mellitus without complications: Secondary | ICD-10-CM | POA: Diagnosis not present

## 2023-06-22 LAB — HM DIABETES EYE EXAM

## 2023-06-30 DIAGNOSIS — H60391 Other infective otitis externa, right ear: Secondary | ICD-10-CM | POA: Diagnosis not present

## 2023-06-30 DIAGNOSIS — Z6830 Body mass index (BMI) 30.0-30.9, adult: Secondary | ICD-10-CM | POA: Diagnosis not present

## 2023-06-30 DIAGNOSIS — H6121 Impacted cerumen, right ear: Secondary | ICD-10-CM | POA: Diagnosis not present

## 2023-06-30 DIAGNOSIS — I10 Essential (primary) hypertension: Secondary | ICD-10-CM | POA: Diagnosis not present

## 2023-07-05 DIAGNOSIS — H0288A Meibomian gland dysfunction right eye, upper and lower eyelids: Secondary | ICD-10-CM | POA: Diagnosis not present

## 2023-07-28 ENCOUNTER — Other Ambulatory Visit: Payer: Self-pay | Admitting: Internal Medicine

## 2023-07-28 ENCOUNTER — Other Ambulatory Visit: Payer: Self-pay | Admitting: Family

## 2023-08-08 ENCOUNTER — Encounter: Payer: Self-pay | Admitting: Internal Medicine

## 2023-08-08 ENCOUNTER — Ambulatory Visit (INDEPENDENT_AMBULATORY_CARE_PROVIDER_SITE_OTHER): Payer: Medicare Other | Admitting: Internal Medicine

## 2023-08-08 VITALS — BP 130/66 | HR 68 | Temp 98.0°F | Resp 16 | Ht 70.0 in | Wt 228.4 lb

## 2023-08-08 DIAGNOSIS — J439 Emphysema, unspecified: Secondary | ICD-10-CM | POA: Diagnosis not present

## 2023-08-08 DIAGNOSIS — Z7984 Long term (current) use of oral hypoglycemic drugs: Secondary | ICD-10-CM | POA: Diagnosis not present

## 2023-08-08 DIAGNOSIS — I10 Essential (primary) hypertension: Secondary | ICD-10-CM | POA: Diagnosis not present

## 2023-08-08 DIAGNOSIS — E1169 Type 2 diabetes mellitus with other specified complication: Secondary | ICD-10-CM

## 2023-08-08 LAB — BASIC METABOLIC PANEL WITH GFR
BUN: 22 mg/dL (ref 6–23)
CO2: 25 meq/L (ref 19–32)
Calcium: 9.2 mg/dL (ref 8.4–10.5)
Chloride: 104 meq/L (ref 96–112)
Creatinine, Ser: 1.2 mg/dL (ref 0.40–1.50)
GFR: 58.59 mL/min — ABNORMAL LOW (ref >60.00)
Glucose, Bld: 119 mg/dL — ABNORMAL HIGH (ref 70–99)
Potassium: 4.1 meq/L (ref 3.5–5.1)
Sodium: 140 meq/L (ref 135–145)

## 2023-08-08 LAB — CBC WITH DIFFERENTIAL/PLATELET
Basophils Absolute: 0 10*3/uL (ref 0.0–0.1)
Basophils Relative: 0.6 % (ref 0.0–3.0)
Eosinophils Absolute: 0.3 10*3/uL (ref 0.0–0.7)
Eosinophils Relative: 4 % (ref 0.0–5.0)
HCT: 42.8 % (ref 39.0–52.0)
Hemoglobin: 14.2 g/dL (ref 13.0–17.0)
Lymphocytes Relative: 14.1 % (ref 12.0–46.0)
Lymphs Abs: 1 10*3/uL (ref 0.7–4.0)
MCHC: 33.2 g/dL (ref 30.0–36.0)
MCV: 94.8 fl (ref 78.0–100.0)
Monocytes Absolute: 0.7 10*3/uL (ref 0.1–1.0)
Monocytes Relative: 10 % (ref 3.0–12.0)
Neutro Abs: 5.3 10*3/uL (ref 1.4–7.7)
Neutrophils Relative %: 71.3 % (ref 43.0–77.0)
Platelets: 251 10*3/uL (ref 150.0–400.0)
RBC: 4.51 Mil/uL (ref 4.22–5.81)
RDW: 14.3 % (ref 11.5–15.5)
WBC: 7.4 10*3/uL (ref 4.0–10.5)

## 2023-08-08 LAB — MICROALBUMIN / CREATININE URINE RATIO
Creatinine,U: 47.5 mg/dL
Microalb Creat Ratio: 498.7 mg/g — ABNORMAL HIGH (ref 0.0–30.0)
Microalb, Ur: 23.7 mg/dL — ABNORMAL HIGH (ref 0.0–1.9)

## 2023-08-08 LAB — HEMOGLOBIN A1C: Hgb A1c MFr Bld: 6.9 % — ABNORMAL HIGH (ref 4.6–6.5)

## 2023-08-08 MED ORDER — ACCU-CHEK SOFTCLIX LANCETS MISC: 12 refills | Status: AC

## 2023-08-08 MED ORDER — ACCU-CHEK GUIDE TEST VI STRP: ORAL_STRIP | 12 refills | Status: DC

## 2023-08-08 NOTE — Patient Instructions (Addendum)
 To prevent leg swelling: Stay active Watch your salt intake Leg elevation daily.  Check the  blood pressure regularly Blood pressure goal:  between 110/65 and  135/85. If it is consistently higher or lower, let me know  Diabetes: You can check your sugars at different times  - early in AM fasting  ( blood sugar goal 70-130) - 2 hours after a meal (blood sugar goal less than 180)      GO TO THE LAB : Get the blood work     Please go to the front desk: Arrange for a follow-up in 4 months       Per our records you are due for your diabetic eye exam. Please contact your eye doctor to schedule an appointment. Please have them send copies of your office visit notes to [place]. Our fax number is [phone]. If you need a referral to an eye doctor please let us know.

## 2023-08-08 NOTE — Progress Notes (Unsigned)
 Subjective:    Patient ID: Carl Knapp, male    DOB: 1945/10/09, 78 y.o.   MRN: [id number]  DOS:  08/08/2023 Type of visit - description: Follow-up  Since the last office visit, he is feeling well. Denies chest pain or difficulty breathing. No cough or DOE. Has noted some edema.   Review of Systems See above   Past Medical History:  Diagnosis Date   Allergy    Anxiety    Aortic atherosclerosis (HCC)    Arthritis    Atrial tachycardia, paroxysmal (HCC)    Noted to have up to 14 beats in a row of atrial tachycardia on event monitor for/2023   Chronic kidney disease    Coronary artery disease 12/2013   20-30% RCA by cath- states was good.   Diabetes mellitus without complication (HCC)    type II   Drug-induced myopathy    DVT (deep venous thrombosis) (HCC)    bilateral legs-greater on left. -3-5 years ago-tx. coumadin x 1 year.   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   L   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    Past Surgical History:  Procedure Laterality Date   AMPUTATION FINGER Left 01/11/2023   index and ring finger   APPENDECTOMY     open '68   HERNIA REPAIR Bilateral 1980   inguinal   INCISION AND DRAINAGE Left 01/11/2023   Procedure: INCISION AND DRAINAGE LEFT INDEX FINGER AND LEFT RING FINGER;  Surgeon: Ramon Dredge, MD;  Location: [facility];  Service: Orthopedics;   Laterality: Left;   INSERTION OF MESH N/A 06/14/2017   Procedure: INSERTION OF MESH;  Surgeon: Ovidio Kin, MD;  Location: [facility];  Service: General;  Laterality: N/A;   LEFT HEART CATHETERIZATION WITH CORONARY ANGIOGRAM N/A 12/09/2013   Procedure: LEFT HEART CATHETERIZATION WITH CORONARY ANGIOGRAM;  Surgeon: Corky Crafts, MD;  Location: [facility] CATH LAB;  Service: Cardiovascular;  Laterality: N/A;   TONSILLECTOMY     UMBILICAL HERNIA REPAIR N/A 06/14/2017   Procedure: LAPAROSCOPIC UMBILICAL HERNIA REPAIR WITH MESH ERAS PATHWAY;  Surgeon: Ovidio Kin, MD;  Location: [facility];  Service: General;  Laterality: N/A;  ERAS PATHWAY   VASECTOMY      Current Outpatient Medications  Medication Instructions   Accu-Chek Softclix Lancets lancets Check blood sugars 1-2 times daily   acetaminophen (TYLENOL) 1,000 mg, Daily PRN   amLODipine (NORVASC) 10 mg, Oral, Daily   aspirin EC 81 mg, Daily   atorvastatin (LIPITOR) 40 MG tablet TAKE 1.5 TABLETS BY MOUTH DAILY.   empagliflozin (JARDIANCE) 10 mg, Oral, Daily before breakfast   glucose blood (ACCU-CHEK GUIDE TEST) test strip Check blood sugars 1-2 times daily   hydrALAZINE (APRESOLINE) 25 mg, 3 times daily   ketoconazole (NIZORAL) 2 % cream 1 application , 2 times daily PRN  loratadine (CLARITIN) 10 mg, Daily PRN   losartan (COZAAR) 100 mg, Oral, Daily   metFORMIN (GLUCOPHAGE) 1000 MG tablet TAKE 1 TABLET (1,000 MG TOTAL) BY MOUTH TWICE A DAY WITH FOOD   methocarbamol (ROBAXIN-750) 750 mg, Oral, 2 times daily PRN   Multiple Vitamins-Minerals (PRESERVISION AREDS 2) CAPS 1 capsule, 2 times daily   multivitamin (ONE-A-DAY MEN'S) TABS tablet 1 tablet, Daily   Omega-3 Fatty Acids (FISH OIL) 1000 MG CAPS 1 capsule, 2 times daily   pioglitazone (ACTOS) 45 mg, Oral, Every morning   tadalafil (CIALIS) 10-20 mg, Oral, Every 48 hours PRN   triamcinolone cream (KENALOG) 0.1 % 1 Application, Topical, 2 times daily PRN       Objective:   Physical  Exam BP 130/66   Pulse 68   Temp 98 F (36.7 C) (Oral)   Resp 16   Ht 5\' 10"  (1.778 m)   Wt 228 lb 6 oz (103.6 kg)   SpO2 97%   BMI 32.77 kg/m  General:   Well developed, NAD, BMI noted. HEENT:  Normocephalic . Face symmetric, atraumatic Lungs:  CTA B Normal respiratory effort, no intercostal retractions, no accessory muscle use. Heart: RRR,  no murmur.  Lower extremities: +/+++ pretibial edema bilaterally  Skin: Not pale. Not jaundice Neurologic:  alert & oriented X3.  Speech normal, gait appropriate for age and unassisted Psych--  Cognition and judgment appear intact.  Cooperative with normal attention span and concentration.  Behavior appropriate. No anxious or depressed appearing.      Assessment    Assessment  (new patient 09/2021, previous PCP retiring) DM dx ~ 2010 + Microalbumin: Refer to nephrology 07-2022 HTN.  Beta-blocker intolerant and per cardiology note 10/2022. 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  On Jardiance, metformin, pioglitazone.  Ambulatory CBGs fasting 106, before a meal 140. Plan: A1c, micro, prescription for test strips and lancets sent.  CBG goals provided. + Microalbumin: Next visit with nephrology 01-2024. HTN: On amlodipine, hydralazine, losartan, check BMP and CBC.  Ambulatory BPs in the 130s. Edema: Has some leg edema, recommend low-salt diet, physical activity, leg elevation.  Noting he takes amlodipine and pioglitazone that could contribute to edema. Emphysema per PET scan, he is a former smoker, no symptoms. RTC 4 months.

## 2023-08-09 DIAGNOSIS — J439 Emphysema, unspecified: Secondary | ICD-10-CM | POA: Insufficient documentation

## 2023-08-09 NOTE — Assessment & Plan Note (Signed)
 DM: Last A1c 6.9, no changes were recommended.  On Jardiance, metformin, pioglitazone.  Ambulatory CBGs fasting 106, before a meal 140. Plan: A1c, micro, prescription for test strips and lancets sent.  CBG goals provided. + Microalbumin: Next visit with nephrology 01-2024. HTN: On amlodipine, hydralazine, losartan, check BMP and CBC.  Ambulatory BPs in the 130s. Edema: Has some leg edema, recommend low-salt diet, physical activity, leg elevation.  Noting he takes amlodipine and pioglitazone that could contribute to edema. Emphysema per PET scan, he is a former smoker, no symptoms. RTC 4 months.

## 2023-08-10 ENCOUNTER — Encounter: Payer: Self-pay | Admitting: Internal Medicine

## 2023-09-01 ENCOUNTER — Other Ambulatory Visit: Payer: Self-pay | Admitting: Family

## 2023-09-26 ENCOUNTER — Telehealth: Payer: Self-pay

## 2023-09-26 DIAGNOSIS — M5410 Radiculopathy, site unspecified: Secondary | ICD-10-CM

## 2023-09-26 DIAGNOSIS — M542 Cervicalgia: Secondary | ICD-10-CM

## 2023-09-26 NOTE — Telephone Encounter (Signed)
 Copied from CRM [id number]. Topic: Referral - Request for Referral >> Sep 26, 2023 10:11 AM Carlatta H wrote: Did the patient discuss referral with their provider in the last year? Yes (If No - schedule appointment) (If Yes - send message)  Appointment offered? No  Type of order/referral and detailed reason for visit: Neck  Preference of office, provider, location: [facility]   If referral order, have you been seen by this specialty before? Yes (If Yes, this issue or another issue? When? Where?[facility]  Can we respond through MyChart? Yes

## 2023-09-26 NOTE — Telephone Encounter (Signed)
 Referral placed.

## 2023-10-04 ENCOUNTER — Telehealth: Payer: Self-pay | Admitting: Internal Medicine

## 2023-10-04 NOTE — Telephone Encounter (Signed)
Can someone check status please

## 2023-10-04 NOTE — Telephone Encounter (Signed)
 Copied from CRM [id number]. Topic: Referral - Status >> Oct 04, 2023  8:51 AM Martinique E wrote: Reason for CRM: Patient called in wanting an update on his PT referral, relayed to patient that it is pending review, patient questioning how long it will take before it is authorized in order to make an appointment. Callback number for patient is [phone].

## 2023-10-04 NOTE — Telephone Encounter (Signed)
 Spoke w/ Pt- informed that PT referral has been sent. Pt verbalized understanding.

## 2023-10-08 DIAGNOSIS — M25512 Pain in left shoulder: Secondary | ICD-10-CM | POA: Diagnosis not present

## 2023-10-08 DIAGNOSIS — M542 Cervicalgia: Secondary | ICD-10-CM | POA: Diagnosis not present

## 2023-10-16 ENCOUNTER — Ambulatory Visit

## 2023-10-16 VITALS — Ht 70.0 in | Wt 228.0 lb

## 2023-10-16 DIAGNOSIS — Z0000 Encounter for general adult medical examination without abnormal findings: Secondary | ICD-10-CM

## 2023-10-16 DIAGNOSIS — M542 Cervicalgia: Secondary | ICD-10-CM | POA: Diagnosis not present

## 2023-10-16 DIAGNOSIS — M25512 Pain in left shoulder: Secondary | ICD-10-CM | POA: Diagnosis not present

## 2023-10-16 NOTE — Progress Notes (Signed)
 Subjective:   Carl Knapp is a 78 y.o. who presents for a Medicare Wellness preventive visit.  As a reminder, Annual Wellness Visits don't include a physical exam, and some assessments may be limited, especially if this visit is performed virtually. We may recommend an in-person follow-up visit with your provider if needed.  Visit Complete: Virtual I connected with  Carl Knapp on 10/16/23 by a audio enabled telemedicine application and verified that I am speaking with the correct person using two identifiers.  Patient Location: Home  Provider Location: Home Office  I discussed the limitations of evaluation and management by telemedicine. The patient expressed understanding and agreed to proceed.  Vital Signs: Because this visit was a virtual/telehealth visit, some criteria may be missing or patient reported. Any vitals not documented were not able to be obtained and vitals that have been documented are patient reported.    Persons Participating in Visit: Patient.  AWV Questionnaire: No: Patient Medicare AWV questionnaire was not completed prior to this visit.  Cardiac Risk Factors include: advanced age (>32men, >93 women);diabetes mellitus;male gender;hypertension     Objective:     Today's Vitals   10/16/23 0816  Weight: 228 lb (103.4 kg)  Height: 5\' 10"  (1.778 m)   Body mass index is 32.71 kg/m.     10/16/2023    8:23 AM 10/11/2022    8:20 AM 10/11/2021    9:37 PM 09/27/2021   11:03 AM 06/14/2017    6:58 AM 06/11/2017    8:35 AM 06/08/2015    8:45 AM  Advanced Directives  Does Patient Have a Medical Advance Directive? Yes Yes No Yes Yes Yes Yes  Type of Estate agent of [place];Living will Healthcare Power of [place];Living will  Healthcare Power of [place];Out of facility DNR (pink MOST or yellow form);Living will Healthcare Power of [place];Living will Healthcare Power of [place];Living will Living will;Healthcare Power of Attorney  Does  patient want to make changes to medical advance directive? No - Patient declined No - Patient declined  No - Patient declined No - Patient declined No - Patient declined No - Patient declined  Copy of Healthcare Power of Attorney in Chart? Yes - validated most recent copy scanned in chart (See row information) Yes - validated most recent copy scanned in chart (See row information)  Yes - validated most recent copy scanned in chart (See row information) Yes Yes Yes    Current Medications (verified) Outpatient Encounter Medications as of 10/16/2023  Medication Sig   Accu-Chek Softclix Lancets lancets Check blood sugars 1-2 times daily   acetaminophen  (TYLENOL ) 500 MG tablet Take 1,000 mg by mouth daily as needed for moderate pain or headache.   amLODipine  (NORVASC ) 10 MG tablet Take 1 tablet (10 mg total) by mouth daily.   aspirin  EC 81 MG tablet Take 81 mg by mouth daily.   atorvastatin  (LIPITOR) 40 MG tablet TAKE 1.5 TABLETS BY MOUTH DAILY.   empagliflozin  (JARDIANCE ) 10 MG TABS tablet Take 1 tablet (10 mg total) by mouth daily before breakfast.   glucose blood (ACCU-CHEK GUIDE TEST) test strip Check blood sugars 1-2 times daily   hydrALAZINE (APRESOLINE) 25 MG tablet Take 25 mg by mouth 3 (three) times daily.   ketoconazole (NIZORAL) 2 % cream Apply 1 application. topically 2 (two) times daily as needed for irritation (eczema).   loratadine (CLARITIN) 10 MG tablet Take 10 mg by mouth daily as needed for allergies.   losartan  (COZAAR ) 100 MG tablet Take  1 tablet (100 mg total) by mouth daily.   metFORMIN  (GLUCOPHAGE ) 1000 MG tablet TAKE 1 TABLET (1,000 MG TOTAL) BY MOUTH TWICE A DAY WITH FOOD   methocarbamol  (ROBAXIN -750) 750 MG tablet Take 1 tablet (750 mg total) by mouth 2 (two) times daily as needed for muscle spasms. (Patient not taking: Reported on 08/08/2023)   Multiple Vitamins-Minerals (PRESERVISION AREDS 2) CAPS Take 1 capsule by mouth in the morning and at bedtime.   multivitamin  (ONE-A-DAY MEN'S) TABS tablet Take 1 tablet by mouth daily.   Omega-3 Fatty Acids (FISH OIL) 1000 MG CAPS Take 1 capsule by mouth in the morning and at bedtime.   pioglitazone  (ACTOS ) 45 MG tablet Take 1 tablet (45 mg total) by mouth every morning.   tadalafil  (CIALIS ) 20 MG tablet Take 0.5-1 tablets (10-20 mg total) by mouth every other day as needed for erectile dysfunction.   triamcinolone  cream (KENALOG ) 0.1 % Apply 1 Application topically 2 (two) times daily as needed (eczema).   No facility-administered encounter medications on file as of 10/16/2023.    Allergies (verified) Ozempic (0.25 or 0.5 mg-dose) [semaglutide(0.25 or 0.5mg -dos)] and Simvastatin   History: Past Medical History:  Diagnosis Date   Allergy    Anxiety    Aortic atherosclerosis (HCC)    Arthritis    Atrial tachycardia, paroxysmal (HCC)    Noted to have up to 14 beats in a row of atrial tachycardia on event monitor for/2023   Chronic kidney disease    Coronary artery disease 12/2013   20-30% RCA by cath- states was good.   Diabetes mellitus without complication (HCC)    type II   Drug-induced myopathy    DVT (deep venous thrombosis) (HCC)    bilateral legs-greater on left. -3-5 years ago-tx. coumadin x 1 year.   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   L   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   Past Surgical History:   Procedure Laterality Date   AMPUTATION FINGER Left 01/11/2023   index and ring finger   APPENDECTOMY     open '68   HERNIA REPAIR Bilateral 1980   inguinal   INCISION AND DRAINAGE Left 01/11/2023   Procedure: INCISION AND DRAINAGE LEFT INDEX FINGER AND LEFT RING FINGER;  Surgeon: Gillermo Lack, MD;  Location: [facility];  Service: Orthopedics;  Laterality: Left;   INSERTION OF MESH N/A 06/14/2017   Procedure: INSERTION OF MESH;  Surgeon: Juanita Norlander, MD;  Location: [facility];  Service: General;  Laterality: N/A;   LEFT HEART CATHETERIZATION WITH CORONARY ANGIOGRAM N/A 12/09/2013   Procedure: LEFT HEART CATHETERIZATION WITH CORONARY ANGIOGRAM;  Surgeon: Lucendia Rusk, MD;  Location: [facility] CATH LAB;  Service: Cardiovascular;  Laterality: N/A;   TONSILLECTOMY     UMBILICAL HERNIA REPAIR N/A 06/14/2017   Procedure: LAPAROSCOPIC UMBILICAL HERNIA  REPAIR WITH MESH ERAS PATHWAY;  Surgeon: Juanita Norlander, MD;  Location: [facility];  Service: General;  Laterality: N/A;  ERAS PATHWAY   VASECTOMY     Family History  Problem Relation Age of Onset   Colon cancer Mother    CAD Father    AAA (abdominal aortic aneurysm) Father    Heart attack Father    Arthritis Father    Lung cancer Father    Hypertension Father    Lung cancer Maternal Grandmother    Stroke Maternal Grandfather 22   Cancer Paternal Grandmother    Heart attack Paternal Grandfather 39   Hypertension Other    Prostate cancer Neg Hx    Social History   Socioeconomic History   Marital status: Married    Spouse name: Not on file   Number of children: 3   Years of education: Not on file   Highest education level: Bachelor's degree (e.g., BA, AB, BS)  Occupational History   Occupation: retired- Primary school teacher  Tobacco Use   Smoking status: Former    Current packs/day: 0.00    Average packs/day: 0.5 packs/day for 20.0 years (10.0 ttl pk-yrs)    Types: Cigarettes    Start date: 06/08/1990    Quit date: 06/08/2010    Years  since quitting: 13.3   Smokeless tobacco: Never   Tobacco comments:    < 1/2   Vaping Use   Vaping status: Never Used  Substance and Sexual Activity   Alcohol use: Yes    Alcohol/week: 2.0 standard drinks of alcohol    Types: 1 Glasses of wine, 1 Cans of beer per week    Comment: rare beer or glass of wine   Drug use: No   Sexual activity: Yes  Other Topics Concern   Not on file  Social History Narrative   Not on file   Social Drivers of Health   Financial Resource Strain: Low Risk  (10/16/2023)   Overall Financial Resource Strain (CARDIA)    Difficulty of Paying Living Expenses: Not hard at all  Food Insecurity: No Food Insecurity (10/16/2023)   Hunger Vital Sign    Worried About Running Out of Food in the Last Year: Never true    Ran Out of Food in the Last Year: Never true  Transportation Needs: No Transportation Needs (10/16/2023)   PRAPARE - Administrator, Civil Service (Medical): No    Lack of Transportation (Non-Medical): No  Physical Activity: Sufficiently Active (10/16/2023)   Exercise Vital Sign    Days of Exercise per Week: 7 days    Minutes of Exercise per Session: 100 min  Stress: No Stress Concern Present (10/16/2023)   [employer] of Occupational Health - Occupational Stress Questionnaire    Feeling of Stress : Not at all  Social Connections: Socially Integrated (10/16/2023)   Social Connection and Isolation Panel [NHANES]    Frequency of Communication with Friends and Family: More than three times a week    Frequency of Social Gatherings with Friends and Family: More than three times a week    Attends Religious Services: More than 4 times per year    Active Member of [employer] or Organizations: Yes    Attends Engineer, structural: More than 4 times per year    Marital Status: Married    Tobacco Counseling Counseling given: Not Answered Tobacco comments: < 1/2     Clinical Intake:  Pre-visit preparation completed: Yes  Pain :  No/denies pain  BMI - recorded: 32.71 Nutritional Status: BMI > 30  Obese Nutritional Risks: None Diabetes: Yes CBG done?: No Did pt. bring in CBG monitor from home?: No  Lab Results  Component Value Date   HGBA1C 6.9 (H) 08/08/2023   HGBA1C 6.9 (H) 04/09/2023   HGBA1C 6.9 (H) 11/28/2022     How often do you need to have someone help you when you read instructions, pamphlets, or other written materials from your doctor or pharmacy?: 1 - Never  Interpreter Needed?: No  Information entered by :: Farris Hong LPN   Activities of Daily Living     10/16/2023    8:22 AM  In your present state of health, do you have any difficulty performing the following activities:  Hearing? 1  Comment Wears Hearing Aids  Vision? 0  Difficulty concentrating or making decisions? 0  Walking or climbing stairs? 0  Dressing or bathing? 0  Doing errands, shopping? 0  Preparing Food and eating ? N  Using the Toilet? N  In the past six months, have you accidently leaked urine? N  Do you have problems with loss of bowel control? N  Managing your Medications? N  Managing your Finances? N  Housekeeping or managing your Housekeeping? N    Patient Care Team: Ezell Hollow, MD as PCP - General (Internal Medicine) Jacqueline Matsu, MD as PCP - Cardiology (Cardiology) Lanita Pitman, MD as Consulting Physician (Gastroenterology) [place], Heather, OD (Optometry) Clementine Cutting, MD as Consulting Physician (Nephrology)  I have updated your Care Teams any recent Medical Services you may have received from other providers in the past year.     Assessment:    This is a routine wellness examination for Carl Knapp.  Hearing/Vision screen Hearing Screening - Comments:: Wears Hearing Aids Vision Screening - Comments:: Wears rx glasses - up to date with routine eye exams with  [facility]   Goals Addressed               This Visit's Progress     Remain Active (pt-stated)          Depression Screen     10/16/2023    8:22 AM 04/09/2023    8:32 AM 11/28/2022    9:11 AM 10/11/2022    8:23 AM 07/27/2022   10:10 AM 04/24/2022    8:12 AM 12/21/2021    2:42 PM  PHQ 2/9 Scores  PHQ - 2 Score 0 0 0 0 0 0 0    Fall Risk     10/16/2023    8:23 AM 04/09/2023    8:32 AM 11/28/2022    9:11 AM 10/11/2022    7:45 AM 07/27/2022   10:10 AM  Fall Risk   Falls in the past year? 0 0 0 0 0  Number falls in past yr: 0 0 0 0 0  Injury with Fall? 0 0 0 0 0  Risk for fall due to : No Fall Risks   No Fall Risks   Follow up Falls evaluation completed Falls evaluation completed Falls evaluation completed Falls evaluation completed Falls evaluation completed    MEDICARE RISK AT HOME:  Medicare Risk at Home Any stairs in or around the home?: No If so, are there any without handrails?: No Home free of loose throw rugs in walkways, pet beds, electrical cords, etc?: Yes Adequate lighting in your home to reduce risk of falls?: Yes Life alert?: No Use of a cane, walker or w/c?: No Grab bars in  the bathroom?: Yes Shower chair or bench in shower?: No Elevated toilet seat or a handicapped toilet?: Yes  TIMED UP AND GO:  Was the test performed?  No  Cognitive Function: 6CIT completed        10/11/2022    8:27 AM 09/27/2021   11:11 AM  6CIT Screen  What Year? 0 points 0 points  What month? 0 points 0 points  What time? 0 points 0 points  Count back from 20 0 points 0 points  Months in reverse 0 points 0 points  Repeat phrase 0 points 0 points  Total Score 0 points 0 points    Immunizations Immunization History  Administered Date(s) Administered   Influenza-Unspecified 01/26/2022, 01/07/2023   PFIZER(Purple Top)SARS-COV-2 Vaccination 06/15/2019, 07/09/2019, 03/20/2020, 08/18/2020   Pfizer Covid-19 Vaccine Bivalent Booster 67yrs & up 02/21/2021, 01/26/2022   Pfizer(Comirnaty)Fall Seasonal Vaccine 12 years and older 04/10/2023   Pneumococcal Conjugate-13 04/14/2014    Pneumococcal Polysaccharide-23 10/05/2011, 06/27/2021   RSV,unspecified 03/06/2022   Td 01/14/2018   Tdap 05/14/2007, 01/11/2023   Zoster Recombinant(Shingrix) 05/08/2019, 09/16/2019   Zoster, Live 02/20/2012    Screening Tests Health Maintenance  Topic Date Due   COVID-19 Vaccine (8 - 2024-25 season) 10/09/2023   INFLUENZA VACCINE  12/07/2023   HEMOGLOBIN A1C  02/07/2024   FOOT EXAM  04/08/2024   OPHTHALMOLOGY EXAM  06/21/2024   Diabetic kidney evaluation - eGFR measurement  08/07/2024   Diabetic kidney evaluation - Urine ACR  08/07/2024   Medicare Annual Wellness (AWV)  10/15/2024   DTaP/Tdap/Td (4 - Td or Tdap) 01/10/2033   Pneumonia Vaccine 52+ Years old  Completed   Hepatitis C Screening  Completed   Zoster Vaccines- Shingrix  Completed   HPV VACCINES  Aged Out   Meningococcal B Vaccine  Aged Out   Colonoscopy  Discontinued    Health Maintenance  Health Maintenance Due  Topic Date Due   COVID-19 Vaccine (8 - 2024-25 season) 10/09/2023   Health Maintenance Items Addressed:   Additional Screening:  Vision Screening: Recommended annual ophthalmology exams for early detection of glaucoma and other disorders of the eye. Would you like a referral to an eye doctor? No    Dental Screening: Recommended annual dental exams for proper oral hygiene  Community Resource Referral / Chronic Care Management: CRR required this visit?  No   CCM required this visit?  No   Plan:    I have personally reviewed and noted the following in the patient's chart:   Medical and social history Use of alcohol, tobacco or illicit drugs  Current medications and supplements including opioid prescriptions. Patient is not currently taking opioid prescriptions. Functional ability and status Nutritional status Physical activity Advanced directives List of other physicians Hospitalizations, surgeries, and ER visits in previous 12 months Vitals Screenings to include cognitive,  depression, and falls Referrals and appointments  In addition, I have reviewed and discussed with patient certain preventive protocols, quality metrics, and best practice recommendations. A written personalized care plan for preventive services as well as general preventive health recommendations were provided to patient.   Dewayne Ford, LPN   1/61/0960   After Visit Summary: (MyChart) Due to this being a telephonic visit, the after visit summary with patients personalized plan was offered to patient via MyChart   Notes: Nothing significant to report at this time.

## 2023-10-16 NOTE — Patient Instructions (Addendum)
 [name] , Thank you for taking time out of your busy schedule to complete your Annual Wellness Visit with me. I enjoyed our conversation and look forward to speaking with you again next year. I, as well as your care team,  appreciate your ongoing commitment to your health goals. Please review the following plan we discussed and let me know if I can assist you in the future. Your Game plan/ To Do List    Referrals: If you haven't heard from the office you've been referred to, please reach out to them at the phone provided.   Follow up Visits: Next Medicare AWV with our clinical staff: 10/21/24 @ 8:10a   Have you seen your provider in the last 6 months (3 months if uncontrolled diabetes)?  Next Office Visit with your provider: 12/31/23 @ 8a  Clinician Recommendations:  Aim for 30 minutes of exercise or brisk walking, 6-8 glasses of water, and 5 servings of fruits and vegetables each day.       This is a list of the screening recommended for you and due dates:  Health Maintenance  Topic Date Due   COVID-19 Vaccine (8 - 2024-25 season) 10/09/2023   Flu Shot  12/07/2023   Hemoglobin A1C  02/07/2024   Complete foot exam   04/08/2024   Eye exam for diabetics  06/21/2024   Yearly kidney function blood test for diabetes  08/07/2024   Yearly kidney health urinalysis for diabetes  08/07/2024   Medicare Annual Wellness Visit  10/15/2024   DTaP/Tdap/Td vaccine (4 - Td or Tdap) 01/10/2033   Pneumococcal Vaccine for age over 75  Completed   Hepatitis C Screening  Completed   Zoster (Shingles) Vaccine  Completed   HPV Vaccine  Aged Out   Meningitis B Vaccine  Aged Out   Colon Cancer Screening  Discontinued    Advanced directives: (In Chart) A copy of your advanced directives are scanned into your chart should your provider ever need it. Advance Care Planning is important because it:  [x]  Makes sure you receive the medical care that is consistent with your values, goals, and preferences  [x]  It  provides guidance to your family and loved ones and reduces their decisional burden about whether or not they are making the right decisions based on your wishes.  Follow the link provided in your after visit summary or read over the paperwork we have mailed to you to help you started getting your Advance Directives in place. If you need assistance in completing these, please reach out to us  so that we can help you!  See attachments for Preventive Care and Fall Prevention Tips.

## 2023-10-19 ENCOUNTER — Other Ambulatory Visit: Payer: Self-pay | Admitting: Cardiology

## 2023-10-19 NOTE — Progress Notes (Signed)
 Please attest and cosign this AWV due to 6CIT not being included in previous visit on 10/16/23. Please disregard visit on 10/16/23 as erroneous visit. AWV completed 10/19/23.  Subjective:   Carl Knapp is a 78 y.o. who presents for a Medicare Wellness preventive visit.  As a reminder, Annual Wellness Visits don't include a physical exam, and some assessments may be limited, especially if this visit is performed virtually. We may recommend an in-person follow-up visit with your provider if needed.  Visit Complete: Virtual I connected with  Carl Knapp on 10/19/23 by a audio enabled telemedicine application and verified that I am speaking with the correct person using two identifiers.  Patient Location: Home  Provider Location: Home Office  I discussed the limitations of evaluation and management by telemedicine. The patient expressed understanding and agreed to proceed.  Vital Signs: Because this visit was a virtual/telehealth visit, some criteria may be missing or patient reported. Any vitals not documented were not able to be obtained and vitals that have been documented are patient reported.    Persons Participating in Visit: Patient.  AWV Questionnaire: No: Patient Medicare AWV questionnaire was not completed prior to this visit.  Cardiac Risk Factors include: advanced age (>66men, >4 women);male gender;diabetes mellitus;hypertension     Objective:    Today's Vitals   10/16/23 0816 10/19/23 1156  Weight: 228 lb (103.4 kg) 228 lb (103.4 kg)  Height: 5' 10 (1.778 m) 5' 10 (1.778 m)   Body mass index is 32.71 kg/m.     10/19/2023   12:06 PM 10/16/2023    8:23 AM 10/11/2022    8:20 AM 10/11/2021    9:37 PM 09/27/2021   11:03 AM 06/14/2017    6:58 AM 06/11/2017    8:35 AM  Advanced Directives  Does Patient Have a Medical Advance Directive? Yes Yes Yes No Yes Yes  Yes   Type of Estate agent of [place];Living will Healthcare Power of [place];Living will  Healthcare Power of [place];Living will  Healthcare Power of [place];Out of facility DNR (pink MOST or yellow form);Living will Healthcare Power of [place];Living will Healthcare Power of [place];Living will  Does patient want to make changes to medical advance directive?  No - Patient declined No - Patient declined  No - Patient declined No - Patient declined  No - Patient declined   Copy of Healthcare Power of Attorney in Chart? No - copy requested Yes - validated most recent copy scanned in chart (See row information) Yes - validated most recent copy scanned in chart (See row information)  Yes - validated most recent copy scanned in chart (See row information) Yes  Yes      Data saved with a previous flowsheet row definition    Current Medications (verified) Outpatient Encounter Medications as of 10/16/2023  Medication Sig   Accu-Chek Softclix Lancets lancets Check blood sugars 1-2 times daily   acetaminophen  (TYLENOL ) 500 MG tablet Take 1,000 mg by mouth daily as needed for moderate pain or headache.   amLODipine  (NORVASC ) 10 MG tablet Take 1 tablet (10 mg total) by mouth daily.   aspirin  EC 81 MG tablet Take 81 mg by mouth daily.   atorvastatin  (LIPITOR) 40 MG tablet TAKE 1.5 TABLETS BY MOUTH DAILY.   empagliflozin  (JARDIANCE ) 10 MG TABS tablet Take 1 tablet (10 mg total) by mouth daily before breakfast.   glucose blood (ACCU-CHEK GUIDE TEST) test strip Check blood sugars 1-2 times daily   hydrALAZINE (APRESOLINE) 25 MG tablet  Take 25 mg by mouth 3 (three) times daily.   ketoconazole (NIZORAL) 2 % cream Apply 1 application. topically 2 (two) times daily as needed for irritation (eczema).   loratadine (CLARITIN) 10 MG tablet Take 10 mg by mouth daily as needed for allergies.   losartan  (COZAAR ) 100 MG tablet Take 1 tablet (100 mg total) by mouth daily.   metFORMIN  (GLUCOPHAGE ) 1000 MG tablet TAKE 1 TABLET (1,000 MG TOTAL) BY MOUTH TWICE A DAY WITH FOOD   methocarbamol  (ROBAXIN -750) 750  MG tablet Take 1 tablet (750 mg total) by mouth 2 (two) times daily as needed for muscle spasms. (Patient not taking: Reported on 08/08/2023)   Multiple Vitamins-Minerals (PRESERVISION AREDS 2) CAPS Take 1 capsule by mouth in the morning and at bedtime.   multivitamin (ONE-A-DAY MEN'S) TABS tablet Take 1 tablet by mouth daily.   Omega-3 Fatty Acids (FISH OIL) 1000 MG CAPS Take 1 capsule by mouth in the morning and at bedtime.   pioglitazone  (ACTOS ) 45 MG tablet Take 1 tablet (45 mg total) by mouth every morning.   tadalafil  (CIALIS ) 20 MG tablet Take 0.5-1 tablets (10-20 mg total) by mouth every other day as needed for erectile dysfunction.   triamcinolone  cream (KENALOG ) 0.1 % Apply 1 Application topically 2 (two) times daily as needed (eczema).   No facility-administered encounter medications on file as of 10/16/2023.    Allergies (verified) Ozempic (0.25 or 0.5 mg-dose) [semaglutide(0.25 or 0.5mg -dos)] and Simvastatin   History: Past Medical History:  Diagnosis Date   Allergy    Anxiety    Aortic atherosclerosis (HCC)    Arthritis    Atrial tachycardia, paroxysmal (HCC)    Noted to have up to 14 beats in a row of atrial tachycardia on event monitor for/2023   Chronic kidney disease    Coronary artery disease 12/2013   20-30% RCA by cath- states was good.   Diabetes mellitus without complication (HCC)    type II   Drug-induced myopathy    DVT (deep venous thrombosis) (HCC)    bilateral legs-greater on left. -3-5 years ago-tx. coumadin x 1 year.   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   L   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   Past Surgical History:  Procedure Laterality Date   AMPUTATION FINGER Left 01/11/2023   index and ring finger   APPENDECTOMY     open '68   HERNIA REPAIR Bilateral 1980   inguinal   INCISION AND DRAINAGE Left 01/11/2023   Procedure: INCISION AND DRAINAGE LEFT INDEX FINGER AND LEFT RING FINGER;  Surgeon: Gillermo Lack, MD;  Location: [facility];  Service: Orthopedics;  Laterality: Left;   INSERTION OF MESH N/A 06/14/2017   Procedure: INSERTION OF MESH;  Surgeon: Juanita Norlander, MD;  Location: [facility];  Service: General;  Laterality: N/A;  LEFT HEART CATHETERIZATION WITH CORONARY ANGIOGRAM N/A 12/09/2013   Procedure: LEFT HEART CATHETERIZATION WITH CORONARY ANGIOGRAM;  Surgeon: Lucendia Rusk, MD;  [facility] LAB;  Service: Cardiovascular;  Laterality: N/A;   TONSILLECTOMY     UMBILICAL HERNIA REPAIR N/A 06/14/2017   Procedure: LAPAROSCOPIC UMBILICAL HERNIA REPAIR WITH MESH ERAS PATHWAY;  Surgeon: Juanita Norlander, MD;  Location: [facility];  Service: General;  Laterality: N/A;  ERAS PATHWAY   VASECTOMY     Family History  Problem Relation Age of Onset   Colon cancer Mother    CAD Father    AAA (abdominal aortic aneurysm) Father    Heart attack Father    Arthritis Father    Lung cancer Father    Hypertension Father    Lung cancer Maternal Grandmother    Stroke Maternal Grandfather 93   Cancer Paternal Grandmother    Heart attack Paternal Grandfather 36   Hypertension Other    Prostate cancer Neg Hx    Social History   Socioeconomic History   Marital status: Married    Spouse name: Not on file   Number of children: 3   Years of education: Not on file   Highest education level: Bachelor's degree (e.g., BA, AB, BS)  Occupational  History   Occupation: retired- Primary school teacher  Tobacco Use   Smoking status: Former    Current packs/day: 0.00    Average packs/day: 0.5 packs/day for 20.0 years (10.0 ttl pk-yrs)    Types: Cigarettes    Start date: 06/08/1990    Quit date: 06/08/2010    Years since quitting: 13.3   Smokeless tobacco: Never   Tobacco comments:    < 1/2   Vaping Use   Vaping status: Never Used  Substance and Sexual Activity   Alcohol use: Yes    Alcohol/week: 2.0 standard drinks of alcohol    Types: 1 Glasses of wine, 1 Cans of beer per week    Comment: rare beer or glass of wine   Drug use: No   Sexual activity: Yes  Other Topics Concern   Not on file  Social History Narrative   Not on file   Social Drivers of Health   Financial Resource Strain: Low Risk  (10/19/2023)   Overall Financial Resource Strain (CARDIA)    Difficulty of Paying Living Expenses: Not hard at all  Food Insecurity: No Food Insecurity (10/19/2023)   Hunger Vital Sign    Worried About Running Out of Food in the Last Year: Never true    Ran Out of Food in the Last Year: Never true  Transportation Needs: No Transportation Needs (10/19/2023)   PRAPARE - Administrator, Civil Service (Medical): No    Lack of Transportation (Non-Medical): No  Physical Activity: Sufficiently Active (10/19/2023)   Exercise Vital Sign    Days of Exercise per Week: 7 days    Minutes of Exercise per Session: 100 min  Stress: No Stress Concern Present (10/19/2023)   [employer] of Occupational Health - Occupational Stress Questionnaire    Feeling of Stress: Not at all  Social Connections: Socially Integrated (10/19/2023)   Social Connection and Isolation Panel    Frequency of Communication with Friends and Family: More than three times a week    Frequency of Social Gatherings with Friends and Family: More than three times a week    Attends Religious Services: More than 4 times per year    Active Member of [employer] or Organizations: Yes  Attends Engineer, structural: More than 4 times per year    Marital Status: Married    Tobacco Counseling Counseling given: Not Answered Tobacco comments: < 1/2     Clinical Intake:  Pre-visit preparation completed: Yes  Pain : No/denies pain     BMI - recorded: 32.71 Nutritional Status: BMI > 30  Obese Nutritional Risks: None Diabetes: No CBG done?: No Did pt. bring in CBG monitor from home?: No  Lab Results  Component Value Date   HGBA1C 6.9 (H) 08/08/2023   HGBA1C 6.9 (H) 04/09/2023   HGBA1C 6.9 (H) 11/28/2022     How often do you need to have someone help you when you read instructions, pamphlets, or other written materials from your doctor or pharmacy?: 1 - Never  Interpreter Needed?: No  Information entered by :: Farris Hong LPN   Activities of Daily Living     10/19/2023   12:04 PM 10/16/2023    8:22 AM  In your present state of health, do you have any difficulty performing the following activities:  Hearing? 1 1  Comment Wears Heaing Aids Wears Hearing Aids  Vision? 0 0  Difficulty concentrating or making decisions? 0 0  Walking or climbing stairs? 0 0  Dressing or bathing? 0 0  Doing errands, shopping? 0 0  Preparing Food and eating ? N N  Using the Toilet? N N  In the past six months, have you accidently leaked urine? N N  Do you have problems with loss of bowel control? N N  Managing your Medications? N N  Managing your Finances? N N  Housekeeping or managing your Housekeeping? N N    Patient Care Team: Ezell Hollow, MD as PCP - General (Internal Medicine) Jacqueline Matsu, MD as PCP - Cardiology (Cardiology) Lanita Pitman, MD as Consulting Physician (Gastroenterology) [place], Heather, OD (Optometry) Clementine Cutting, MD as Consulting Physician (Nephrology)  I have updated your Care Teams any recent Medical Services you may have received from other providers in the past year.     Assessment:   This is a routine  wellness examination for Carl Knapp.  Hearing/Vision screen Hearing Screening - Comments:: Wears Hearing Aids. Vision Screening - Comments:: Wears rx glasses - up to date with routine eye exams with  [facility].   Goals Addressed               This Visit's Progress     Increase physical activity (pt-stated)        Remain active.      Remain Active (pt-stated)         Depression Screen     10/19/2023   12:02 PM 10/16/2023    8:22 AM 04/09/2023    8:32 AM 11/28/2022    9:11 AM 10/11/2022    8:23 AM 07/27/2022   10:10 AM 04/24/2022    8:12 AM  PHQ 2/9 Scores  PHQ - 2 Score 0 0 0 0 0 0 0    Fall Risk     10/19/2023   12:03 PM 10/16/2023    8:23 AM 04/09/2023    8:32 AM 11/28/2022    9:11 AM 10/11/2022    7:45 AM  Fall Risk   Falls in the past year? 0 0 0 0 0  Number falls in past yr: 0 0 0 0 0  Injury with Fall? 0 0 0 0 0  Risk for fall due to : No Fall Risks No Fall Risks  No Fall Risks  Follow up Falls evaluation completed Falls evaluation completed Falls evaluation completed Falls evaluation completed Falls evaluation completed    MEDICARE RISK AT HOME:  Medicare Risk at Home Any stairs in or around the home?: No If so, are there any without handrails?: No Home free of loose throw rugs in walkways, pet beds, electrical cords, etc?: Yes Adequate lighting in your home to reduce risk of falls?: Yes Life alert?: No Use of a cane, walker or w/c?: No Grab bars in the bathroom?: Yes Shower chair or bench in shower?: No Elevated toilet seat or a handicapped toilet?: Yes  TIMED UP AND GO:  Was the test performed?  No  Cognitive Function: 6CIT completed        10/19/2023   12:06 PM 10/11/2022    8:27 AM 09/27/2021   11:11 AM  6CIT Screen  What Year? 0 points 0 points 0 points  What month? 0 points 0 points 0 points  What time? 0 points 0 points 0 points  Count back from 20 0 points 0 points 0 points  Months in reverse 0 points 0 points 0 points  Repeat phrase 0  points 0 points 0 points  Total Score 0 points 0 points 0 points    Immunizations Immunization History  Administered Date(s) Administered   Influenza-Unspecified 01/26/2022, 01/07/2023   PFIZER(Purple Top)SARS-COV-2 Vaccination 06/15/2019, 07/09/2019, 03/20/2020, 08/18/2020   Pfizer Covid-19 Vaccine Bivalent Booster 30yrs & up 02/21/2021, 01/26/2022   Pfizer(Comirnaty)Fall Seasonal Vaccine 12 years and older 04/10/2023   Pneumococcal Conjugate-13 04/14/2014   Pneumococcal Polysaccharide-23 10/05/2011, 06/27/2021   RSV,unspecified 03/06/2022   Td 01/14/2018   Tdap 05/14/2007, 01/11/2023   Zoster Recombinant(Shingrix) 05/08/2019, 09/16/2019   Zoster, Live 02/20/2012    Screening Tests Health Maintenance  Topic Date Due   COVID-19 Vaccine (8 - 2024-25 season) 10/09/2023   INFLUENZA VACCINE  12/07/2023   HEMOGLOBIN A1C  02/07/2024   FOOT EXAM  04/08/2024   OPHTHALMOLOGY EXAM  06/21/2024   Diabetic kidney evaluation - eGFR measurement  08/07/2024   Diabetic kidney evaluation - Urine ACR  08/07/2024   Medicare Annual Wellness (AWV)  10/15/2024   DTaP/Tdap/Td (4 - Td or Tdap) 01/10/2033   Pneumococcal Vaccine: 50+ Years  Completed   Hepatitis C Screening  Completed   Zoster Vaccines- Shingrix  Completed   HPV VACCINES  Aged Out   Meningococcal B Vaccine  Aged Out   Colonoscopy  Discontinued    Health Maintenance  Health Maintenance Due  Topic Date Due   COVID-19 Vaccine (8 - 2024-25 season) 10/09/2023   Health Maintenance Items Addressed:   Additional Screening:  Vision Screening: Recommended annual ophthalmology exams for early detection of glaucoma and other disorders of the eye. Would you like a referral to an eye doctor? No    Dental Screening: Recommended annual dental exams for proper oral hygiene  Community Resource Referral / Chronic Care Management: CRR required this visit?  No   CCM required this visit?  No   Plan:    I have personally reviewed and  noted the following in the patient's chart:   Medical and social history Use of alcohol, tobacco or illicit drugs  Current medications and supplements including opioid prescriptions. Patient is not currently taking opioid prescriptions. Functional ability and status Nutritional status Physical activity Advanced directives List of other physicians Hospitalizations, surgeries, and ER visits in previous 12 months Vitals Screenings to include cognitive, depression, and falls Referrals and appointments  In addition, I  have reviewed and discussed with patient certain preventive protocols, quality metrics, and best practice recommendations. A written personalized care plan for preventive services as well as general preventive health recommendations were provided to patient.   Dewayne Ford, LPN   1/61/0960   After Visit Summary: (MyChart) Due to this being a telephonic visit, the after visit summary with patients personalized plan was offered to patient via MyChart   Notes: Nothing significant to report at this time.

## 2023-10-19 NOTE — Addendum Note (Signed)
 Addended by: Dewayne Ford on: 10/19/2023 12:21 PM   Modules accepted: Orders

## 2023-10-22 ENCOUNTER — Telehealth: Payer: Self-pay | Admitting: Internal Medicine

## 2023-10-22 MED ORDER — METFORMIN HCL 1000 MG PO TABS: 1000.0000 mg | ORAL_TABLET | Freq: Two times a day (BID) | ORAL | 1 refills | Status: DC

## 2023-10-22 NOTE — Telephone Encounter (Signed)
 Copied from CRM [id number]. Topic: Clinical - Medication Refill >> Oct 22, 2023  9:21 AM Emylou G wrote: Medication: metFORMIN  (GLUCOPHAGE ) 1000 MG tablet  Would like 90 day supply  Has the patient contacted their pharmacy? Yes (Agent: If no, request that the patient contact the pharmacy for the refill. If patient does not wish to contact the pharmacy document the reason why and proceed with request.) (Agent: If yes, when and what did the pharmacy advise?) no response  This is the patient's preferred pharmacy:  [id number] - [place], [place] - [address] AT [facility] [address] [place] [address] Phone: [phone] Fax: [phone]  Is this the correct pharmacy for this prescription? Yes If no, delete pharmacy and type the correct one.   Has the prescription been filled recently? No  Is the patient out of the medication? Yes  Has the patient been seen for an appointment in the last year OR does the patient have an upcoming appointment? Yes  Can we respond through MyChart? no  Agent: Please be advised that Rx refills may take up to 3 business days. We ask that you follow-up with your pharmacy.

## 2023-10-24 ENCOUNTER — Other Ambulatory Visit: Payer: Self-pay | Admitting: Internal Medicine

## 2023-11-10 ENCOUNTER — Other Ambulatory Visit: Payer: Self-pay | Admitting: Cardiology

## 2023-11-17 ENCOUNTER — Other Ambulatory Visit: Payer: Self-pay | Admitting: Cardiology

## 2023-11-26 ENCOUNTER — Other Ambulatory Visit: Payer: Self-pay | Admitting: Cardiology

## 2023-12-11 ENCOUNTER — Ambulatory Visit: Admitting: Internal Medicine

## 2023-12-11 ENCOUNTER — Encounter: Payer: Self-pay | Admitting: Internal Medicine

## 2023-12-11 VITALS — BP 136/72 | HR 76 | Temp 98.0°F | Resp 16 | Ht 70.0 in | Wt 228.0 lb

## 2023-12-11 DIAGNOSIS — L719 Rosacea, unspecified: Secondary | ICD-10-CM | POA: Diagnosis not present

## 2023-12-11 DIAGNOSIS — E7800 Pure hypercholesterolemia, unspecified: Secondary | ICD-10-CM

## 2023-12-11 DIAGNOSIS — E119 Type 2 diabetes mellitus without complications: Secondary | ICD-10-CM

## 2023-12-11 DIAGNOSIS — L989 Disorder of the skin and subcutaneous tissue, unspecified: Secondary | ICD-10-CM

## 2023-12-11 DIAGNOSIS — E1169 Type 2 diabetes mellitus with other specified complication: Secondary | ICD-10-CM

## 2023-12-11 DIAGNOSIS — I10 Essential (primary) hypertension: Secondary | ICD-10-CM | POA: Diagnosis not present

## 2023-12-11 DIAGNOSIS — L570 Actinic keratosis: Secondary | ICD-10-CM

## 2023-12-11 DIAGNOSIS — Z7984 Long term (current) use of oral hypoglycemic drugs: Secondary | ICD-10-CM | POA: Diagnosis not present

## 2023-12-11 LAB — AST: AST: 22 U/L (ref 0–37)

## 2023-12-11 LAB — LIPID PANEL
Cholesterol: 131 mg/dL (ref 0–200)
HDL: 45.7 mg/dL (ref >39.00)
LDL Cholesterol: 69 mg/dL (ref 0–99)
NonHDL: 85.49
Total CHOL/HDL Ratio: 3
Triglycerides: 84 mg/dL (ref 0.0–149.0)
VLDL: 16.8 mg/dL (ref 0.0–40.0)

## 2023-12-11 LAB — ALT: ALT: 22 U/L (ref 0–53)

## 2023-12-11 LAB — HEMOGLOBIN A1C: Hgb A1c MFr Bld: 6.8 % — ABNORMAL HIGH (ref 4.6–6.5)

## 2023-12-11 MED ORDER — ACCU-CHEK GUIDE TEST VI STRP: ORAL_STRIP | 12 refills | Status: AC

## 2023-12-11 MED ORDER — METRONIDAZOLE 1 % EX GEL: Freq: Every day | CUTANEOUS | 2 refills | Status: AC

## 2023-12-11 MED ORDER — TADALAFIL 10 MG PO TABS: 10.0000 mg | ORAL_TABLET | Freq: Every day | ORAL | 6 refills | Status: DC | PRN

## 2023-12-11 MED ORDER — TRIAMCINOLONE ACETONIDE 0.1 % EX CREA: 1.0000 | TOPICAL_CREAM | Freq: Two times a day (BID) | CUTANEOUS | 0 refills | Status: DC | PRN

## 2023-12-11 NOTE — Progress Notes (Signed)
 Subjective:    Patient ID: Carl Knapp, male    DOB: May 28, 1945, 78 y.o.   MRN: [id number]  DOS:  12/11/2023 Type of visit - description: Follow-up  Chronic medical problems addressed. Multiple other issues discussed as well.  Good medication compliance. ED: Current medication not helping On and off problems with nose peeling and occasional bleed. Has a spot at the left ear.  Denies chest pain or difficulty breathing.  No claudication. No lower extremity edema   Review of Systems See above   Past Medical History:  Diagnosis Date   Allergy    Anxiety    Aortic atherosclerosis (HCC)    Arthritis    Atrial tachycardia, paroxysmal (HCC)    Noted to have up to 14 beats in a row of atrial tachycardia on event monitor for/2023   Chronic kidney disease    Coronary artery disease 12/2013   20-30% RCA by cath- states was good.   Diabetes mellitus without complication (HCC)    type II   Drug-induced myopathy    DVT (deep venous thrombosis) (HCC)    bilateral legs-greater on left. -3-5 years ago-tx. coumadin x 1 year.   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   L   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    Past Surgical History:  Procedure Laterality Date   AMPUTATION FINGER Left 01/11/2023   index and ring finger   APPENDECTOMY     open '68   HERNIA REPAIR Bilateral 1980   inguinal    INCISION AND DRAINAGE Left 01/11/2023   Procedure: INCISION AND DRAINAGE LEFT INDEX FINGER AND LEFT RING FINGER;  Surgeon: Delene Marsa HERO, MD;  Location: [facility];  Service: Orthopedics;  Laterality: Left;   INSERTION OF MESH N/A 06/14/2017   Procedure: INSERTION OF MESH;  Surgeon: Ethyl Lenis, MD;  Location: [facility];  Service: General;  Laterality: N/A;   LEFT HEART CATHETERIZATION WITH CORONARY ANGIOGRAM N/A 12/09/2013   Procedure: LEFT HEART CATHETERIZATION WITH CORONARY ANGIOGRAM;  Surgeon: Candyce GORMAN Reek, MD;  Location: [facility] CATH LAB;  Service: Cardiovascular;  Laterality: N/A;   TONSILLECTOMY     UMBILICAL HERNIA REPAIR N/A 06/14/2017   Procedure: LAPAROSCOPIC UMBILICAL HERNIA REPAIR WITH MESH ERAS PATHWAY;  Surgeon: Ethyl Lenis, MD;  Location: [facility];  Service: General;  Laterality: N/A;  ERAS PATHWAY   VASECTOMY      Current Outpatient Medications  Medication Instructions   Accu-Chek Softclix Lancets lancets Check blood sugars 1-2 times daily   acetaminophen  (TYLENOL ) 1,000 mg, Daily PRN   amLODipine  (NORVASC ) 10 mg, Oral, Daily   aspirin  EC 81 mg, Daily   atorvastatin  (LIPITOR) 40 MG tablet TAKE 1.5 TABLETS BY MOUTH DAILY.   empagliflozin  (JARDIANCE ) 10 mg, Oral, Daily before breakfast   glucose blood (ACCU-CHEK GUIDE TEST) test strip Check blood  sugars 1-2 times daily   hydrALAZINE (APRESOLINE) 25 mg, 3 times daily   ketoconazole (NIZORAL) 2 % cream 1 application , 2 times daily PRN   loratadine (CLARITIN) 10 mg, Daily PRN   losartan  (COZAAR ) 100 mg, Oral, Daily   metFORMIN  (GLUCOPHAGE ) 1,000 mg, Oral, 2 times daily with meals   methocarbamol  (ROBAXIN -750) 750 mg, Oral, 2 times daily PRN   Multiple Vitamins-Minerals (PRESERVISION AREDS 2) CAPS 1 capsule, 2 times daily   multivitamin (ONE-A-DAY MEN'S) TABS tablet 1 tablet, Daily   Omega-3 Fatty Acids (FISH OIL) 1000 MG CAPS 1 capsule, 2 times daily   pioglitazone  (ACTOS ) 45 mg, Oral, Every morning   tadalafil   (CIALIS ) 10-20 mg, Oral, Every 48 hours PRN   triamcinolone  cream (KENALOG ) 0.1 % 1 Application, Topical, 2 times daily PRN       Objective:   Physical Exam BP 136/72   Pulse 76   Temp 98 F (36.7 C) (Oral)   Resp 16   Ht 5' 10 (1.778 m)   Wt 228 lb (103.4 kg)   SpO2 93%   BMI 32.71 kg/m  General:   Well developed, NAD, BMI noted. HEENT:  Normocephalic . Face symmetric, atraumatic DM foot exam: No edema, normal pinprick examination. Skin:  -Nose: Skin is thick, no pustules - L ear, scalp: Multiple skin lesions, mostly small patches of dry and slightly erythematous skin. Neurologic:  alert & oriented X3.  Speech normal, gait appropriate for age and unassisted Psych--  Cognition and judgment appear intact.  Cooperative with normal attention span and concentration.  Behavior appropriate. No anxious or depressed appearing.      Assessment    Assessment  (new patient 09/2021, previous PCP retiring) DM dx ~ 2010 + Microalbumin: Sees nephrology  HTN.  Beta-blocker intolerant and per cardiology note 10/2022. 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  No changes made, on Jardiance , metformin , pioglitazone .  Check A1c. Feet exam negative HTN: Reports ambulatory BP in the 130s.  Continue amlodipine  (no edema) hydralazine, losartan .  Continue monitoring BPs. High cholesterol: On Lipitor 40 mg, check FLP and LFTs. Rosacea, actinic keratosis: New issue, prescribed Flagyl  to use on the nose.  Referred to dermatology.  He uses Kenalog  cream as needed, refill sent. ED: Sildenafil did not help, has tried tadalafil  20 mg 2 tablets every day (more than recommended)  without results.  Plan: Stop other meds and only take tadalafil  10 mg daily, if that does not help urology referral RTC 4  months

## 2023-12-11 NOTE — Assessment & Plan Note (Signed)
 DM: Last A1c remained at 6.9.  No changes made, on Jardiance , metformin , pioglitazone .  Check A1c. Feet exam negative HTN: Reports ambulatory BP in the 130s.  Continue amlodipine  (no edema) hydralazine, losartan .  Continue monitoring BPs. High cholesterol: On Lipitor 40 mg, check FLP and LFTs. Rosacea, actinic keratosis: New issue, prescribed Flagyl  to use on the nose.  Referred to dermatology.  He uses Kenalog  cream as needed, refill sent. ED: Sildenafil did not help, has tried tadalafil  20 mg 2 tablets every day (more than recommended)  without results.  Plan: Stop other meds and only take tadalafil  10 mg daily, if that does not help urology referral RTC 4 months

## 2023-12-11 NOTE — Patient Instructions (Addendum)
 We are referring you to a dermatologist. You have rosacea in the skin. You have skin damage from the sun exposure at the head and ears.  For the nose use Flagyl  gel once daily until you see dermatology  For difficulty with erections: Take tadalafil  10 mg 1 tablet every day.  Do not take any other medication.  Continue checking your blood sugar and blood pressures  GO TO THE LAB :  Get the blood work   Your results will be posted on MyChart with my comments  Next office visit for a checkup in 4 months Please make an appointment before you leave today

## 2023-12-12 DIAGNOSIS — D485 Neoplasm of uncertain behavior of skin: Secondary | ICD-10-CM | POA: Diagnosis not present

## 2023-12-12 DIAGNOSIS — L570 Actinic keratosis: Secondary | ICD-10-CM | POA: Diagnosis not present

## 2023-12-12 DIAGNOSIS — C44321 Squamous cell carcinoma of skin of nose: Secondary | ICD-10-CM | POA: Diagnosis not present

## 2023-12-12 DIAGNOSIS — L718 Other rosacea: Secondary | ICD-10-CM | POA: Diagnosis not present

## 2023-12-12 DIAGNOSIS — L218 Other seborrheic dermatitis: Secondary | ICD-10-CM | POA: Diagnosis not present

## 2023-12-13 ENCOUNTER — Ambulatory Visit: Payer: Self-pay | Admitting: Internal Medicine

## 2023-12-13 MED ORDER — ATORVASTATIN CALCIUM 80 MG PO TABS: 80.0000 mg | ORAL_TABLET | Freq: Every day | ORAL | 1 refills | Status: DC

## 2023-12-19 ENCOUNTER — Other Ambulatory Visit: Payer: Self-pay

## 2023-12-19 ENCOUNTER — Telehealth: Payer: Self-pay | Admitting: Cardiology

## 2023-12-19 MED ORDER — LOSARTAN POTASSIUM 100 MG PO TABS: 100.0000 mg | ORAL_TABLET | Freq: Every day | ORAL | 0 refills | Status: DC

## 2023-12-19 NOTE — Telephone Encounter (Signed)
*  STAT* If patient is at the pharmacy, call can be transferred to refill team.   1. Which medications need to be refilled? (please list name of each medication and dose if known)   losartan  (COZAAR ) 100 MG tablet    2. Which pharmacy/location (including street and city if local pharmacy) is medication to be sent to?  [id number] - [place], [place] - [address]      3. Do they need a 30 day or 90 day supply? 90 day    Pt is out of medication

## 2023-12-26 ENCOUNTER — Other Ambulatory Visit: Payer: Self-pay

## 2023-12-26 DIAGNOSIS — I10 Essential (primary) hypertension: Secondary | ICD-10-CM

## 2023-12-26 DIAGNOSIS — Z79899 Other long term (current) drug therapy: Secondary | ICD-10-CM

## 2023-12-27 ENCOUNTER — Other Ambulatory Visit: Payer: Self-pay | Admitting: Cardiology

## 2023-12-28 ENCOUNTER — Other Ambulatory Visit: Payer: Self-pay

## 2023-12-28 DIAGNOSIS — Z79899 Other long term (current) drug therapy: Secondary | ICD-10-CM

## 2023-12-28 DIAGNOSIS — I10 Essential (primary) hypertension: Secondary | ICD-10-CM | POA: Diagnosis not present

## 2023-12-29 LAB — BASIC METABOLIC PANEL WITH GFR
BUN/Creatinine Ratio: 17 (ref 10–24)
BUN: 22 mg/dL (ref 8–27)
CO2: 19 mmol/L — ABNORMAL LOW (ref 20–29)
Calcium: 9.5 mg/dL (ref 8.6–10.2)
Chloride: 103 mmol/L (ref 96–106)
Creatinine, Ser: 1.26 mg/dL (ref 0.76–1.27)
Glucose: 150 mg/dL — ABNORMAL HIGH (ref 70–99)
Potassium: 4.2 mmol/L (ref 3.5–5.2)
Sodium: 141 mmol/L (ref 134–144)
eGFR: 59 mL/min/1.73 — ABNORMAL LOW (ref >59)

## 2023-12-30 ENCOUNTER — Ambulatory Visit: Payer: Self-pay | Admitting: Cardiology

## 2024-01-04 ENCOUNTER — Telehealth: Payer: Self-pay | Admitting: Cardiology

## 2024-01-04 MED ORDER — LOSARTAN POTASSIUM 100 MG PO TABS: 100.0000 mg | ORAL_TABLET | Freq: Every day | ORAL | 0 refills | Status: DC

## 2024-01-04 NOTE — Telephone Encounter (Signed)
*  STAT* If patient is at the pharmacy, call can be transferred to refill team.   1. Which medications need to be refilled? (please list name of each medication and dose if known)   losartan  (COZAAR ) 100 MG tablet   2. Would you like to learn more about the convenience, safety, & potential cost savings by using the [facility]?   3. Are you open to using the [facility] (Type [facility]. ).  4. Which pharmacy/location (including street and city if local pharmacy) is medication to be sent to?  [id number] - [place], [place] - [address]   5. Do they need a 30 day or 90 day supply?  90 day  Patient stated he is almost out of this medication and will be going out of town.  Patient has appointment scheduled with Dr. Shlomo on 11/17.  Patient wants a call back to confirm prescription sent.

## 2024-01-04 NOTE — Telephone Encounter (Signed)
 Pt's medication was sent to pt's pharmacy as requested. Confirmation received.

## 2024-01-24 DIAGNOSIS — Z23 Encounter for immunization: Secondary | ICD-10-CM | POA: Diagnosis not present

## 2024-02-05 DIAGNOSIS — C44321 Squamous cell carcinoma of skin of nose: Secondary | ICD-10-CM | POA: Diagnosis not present

## 2024-02-14 DIAGNOSIS — L718 Other rosacea: Secondary | ICD-10-CM | POA: Diagnosis not present

## 2024-02-14 DIAGNOSIS — L570 Actinic keratosis: Secondary | ICD-10-CM | POA: Diagnosis not present

## 2024-03-20 ENCOUNTER — Telehealth: Payer: Self-pay

## 2024-03-20 NOTE — Telephone Encounter (Signed)
 Carl Knapp

## 2024-03-22 ENCOUNTER — Other Ambulatory Visit: Payer: Self-pay | Admitting: Cardiology

## 2024-03-24 ENCOUNTER — Encounter: Payer: Self-pay | Admitting: Cardiology

## 2024-03-24 ENCOUNTER — Ambulatory Visit: Attending: Cardiology | Admitting: Cardiology

## 2024-03-24 VITALS — BP 156/62 | HR 74 | Ht 70.0 in | Wt 230.2 lb

## 2024-03-24 DIAGNOSIS — E7800 Pure hypercholesterolemia, unspecified: Secondary | ICD-10-CM | POA: Insufficient documentation

## 2024-03-24 DIAGNOSIS — G4733 Obstructive sleep apnea (adult) (pediatric): Secondary | ICD-10-CM | POA: Insufficient documentation

## 2024-03-24 DIAGNOSIS — I10 Essential (primary) hypertension: Secondary | ICD-10-CM | POA: Insufficient documentation

## 2024-03-24 DIAGNOSIS — I2510 Atherosclerotic heart disease of native coronary artery without angina pectoris: Secondary | ICD-10-CM | POA: Insufficient documentation

## 2024-03-24 MED ORDER — HYDRALAZINE HCL 25 MG PO TABS: 25.0000 mg | ORAL_TABLET | Freq: Three times a day (TID) | ORAL | 3 refills | Status: DC

## 2024-03-24 MED ORDER — LOSARTAN POTASSIUM 100 MG PO TABS: 100.0000 mg | ORAL_TABLET | Freq: Every day | ORAL | 3 refills | Status: AC

## 2024-03-24 MED ORDER — AMLODIPINE BESYLATE 10 MG PO TABS: 10.0000 mg | ORAL_TABLET | Freq: Every day | ORAL | 3 refills | Status: AC

## 2024-03-24 MED ORDER — ATORVASTATIN CALCIUM 80 MG PO TABS: 80.0000 mg | ORAL_TABLET | Freq: Every day | ORAL | 3 refills | Status: DC

## 2024-03-24 NOTE — Patient Instructions (Signed)
 Medication Instructions:  Your physician recommends that you continue on your current medications as directed. Please refer to the Current Medication list given to you today.  *If you need a refill on your cardiac medications before your next appointment, please call your pharmacy*  Follow-Up: At [facility], you and your health needs are our priority.  As part of our continuing mission to provide you with exceptional heart care, our providers are all part of one team.  This team includes your primary Cardiologist (physician) and Advanced Practice Providers or APPs (Physician Assistants and Nurse Practitioners) who all work together to provide you with the care you need, when you need it.  Your next appointment:   1 year(s)  Provider:   Gaylyn Keas, MD    We recommend signing up for the patient portal called "MyChart".  Sign up information is provided on this After Visit Summary.  MyChart is used to connect with patients for Virtual Visits (Telemedicine).  Patients are able to view lab/test results, encounter notes, upcoming appointments, etc.  Non-urgent messages can be sent to your provider as well.   To learn more about what you can do with MyChart, go to [email or web address].

## 2024-03-24 NOTE — Progress Notes (Signed)
 Date:  03/24/2024   ID:  Carl Knapp, DOB [birth year], MRN [id number]  Patient Location:  Home  Provider location:   [place]  PCP:  Amon Aloysius BRAVO, MD  Cardiologist:  Wilbert Bihari, MD  Electrophysiologist:  None   Chief Complaint:  CAD,HTN, OSA  History of Present Illness:    Carl Knapp is a 78 y.o. male with a hx of severe OSA with AHI 37/hr and now on CPAP at 10cm H2O. he has a history of CAD with coronary CTA showing a calcium  score of 0 but 25-49% noncalcified plaque in the mid RCA and mid LAD.  Medical management was recommended at that time.  CT FFR showed a possible flow limiting lesion in the mid to distal LAD but felt likely related to distal tapering of the vessel and not from hemodynamically flow limiting lesion.    When I saw him in 2024 he was complaining of chest pain and he underwent stress PET CT which showed no ischemia and normal LV function.  He is here today for follow-up and is doing well.  He denies any chest pain or pressure, PND, orthopnea, lower extremity edema, dizziness, palpitations or syncope. He has not had any further CP but occasionally has some left shoulder pain but has been raking a lot of leaves.  He does notice some DOE when he first starts to exercise walking up a hill but resolves after 5 minutes.  He is doing well with his  PAP device and thinks that he has gotten used to it.  He tolerates the mask and feels the pressure is adequate.  Since going on PAP he feels rested in the am and has no significant daytime sleepiness.  He denies any significant mouth or nasal dryness or nasal congestion.  He does not think that he snores. Patient denies any episodes of bruxism, restless legs, No gagging hallucinations or cataplectic events.    Prior CV studies:   The following studies were reviewed today:  Coronary CTA, EKG, stress PET CT  Past Medical History:  Diagnosis Date   Allergy    Anxiety    Aortic atherosclerosis    Arthritis    Atrial  tachycardia, paroxysmal    Noted to have up to 14 beats in a row of atrial tachycardia on event monitor for/2023   Chronic kidney disease    Coronary artery disease 12/2013   20-30% RCA by cath- states was good.   Diabetes mellitus without complication (HCC)    type II   Drug-induced myopathy    DVT (deep venous thrombosis) (HCC)    bilateral legs-greater on left. -3-5 years ago-tx. coumadin x 1 year.   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   L   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   Past Surgical History:  Procedure  Laterality Date   AMPUTATION FINGER Left 01/11/2023   index and ring finger   APPENDECTOMY     open '68   HERNIA REPAIR Bilateral 1980   inguinal   INCISION AND DRAINAGE Left 01/11/2023   Procedure: INCISION AND DRAINAGE LEFT INDEX FINGER AND LEFT RING FINGER;  Surgeon: Delene Marsa HERO, MD;  Location: [facility];  Service: Orthopedics;  Laterality: Left;   INSERTION OF MESH N/A 06/14/2017   Procedure: INSERTION OF MESH;  Surgeon: Ethyl Lenis, MD;  Location: [facility];  Service: General;  Laterality: N/A;   LEFT HEART CATHETERIZATION WITH CORONARY ANGIOGRAM N/A 12/09/2013   Procedure: LEFT HEART CATHETERIZATION WITH CORONARY ANGIOGRAM;  Surgeon: Candyce GORMAN Reek, MD;  Location: [facility];  Service: Cardiovascular;  Laterality: N/A;   TONSILLECTOMY     UMBILICAL HERNIA REPAIR  N/A 06/14/2017   Procedure: LAPAROSCOPIC UMBILICAL HERNIA REPAIR WITH MESH ERAS PATHWAY;  Surgeon: Ethyl Lenis, MD;  Location: [facility];  Service: General;  Laterality: N/A;  ERAS PATHWAY   VASECTOMY       Current Meds  Medication Sig   Accu-Chek Softclix Lancets lancets Check blood sugars 1-2 times daily   acetaminophen  (TYLENOL ) 500 MG tablet Take 1,000 mg by mouth daily as needed for moderate pain or headache.   aspirin  EC 81 MG tablet Take 81 mg by mouth daily.   empagliflozin  (JARDIANCE ) 10 MG TABS tablet Take 1 tablet (10 mg total) by mouth daily before breakfast.   glucose blood (ACCU-CHEK GUIDE TEST) test strip Check blood sugars 1-2 times daily   ketoconazole (NIZORAL) 2 % cream Apply 1 application. topically 2 (two) times daily as needed for irritation (eczema).   loratadine (CLARITIN) 10 MG tablet Take 10 mg by mouth daily as needed for allergies.   metFORMIN  (GLUCOPHAGE ) 1000 MG tablet Take 1 tablet (1,000 mg total) by mouth 2 (two) times daily with a meal.   methocarbamol  (ROBAXIN -750) 750 MG tablet Take 1 tablet (750 mg total) by mouth 2 (two) times daily as needed for muscle spasms.   metroNIDAZOLE  (METROGEL ) 1 % gel Apply topically daily.   Multiple Vitamins-Minerals (PRESERVISION AREDS 2) CAPS Take 1 capsule by mouth in the morning and at bedtime.   multivitamin (ONE-A-DAY MEN'S) TABS tablet Take 1 tablet by mouth daily.   Omega-3 Fatty Acids (FISH OIL) 1000 MG CAPS Take 1 capsule by mouth in the morning and at bedtime.   pioglitazone  (ACTOS ) 45 MG tablet Take 1 tablet (45 mg total) by mouth every morning.   tadalafil  (CIALIS ) 10 MG tablet Take 1 tablet (10 mg total) by mouth daily as needed for erectile dysfunction.   triamcinolone  cream (KENALOG ) 0.1 % Apply 1 Application topically 2 (two) times daily as needed (eczema).   [DISCONTINUED] amLODipine  (NORVASC ) 10 MG tablet Take 1 tablet (10 mg total) by mouth daily.   [DISCONTINUED] atorvastatin  (LIPITOR) 80 MG tablet Take 1  tablet (80 mg total) by mouth at bedtime.   [DISCONTINUED] hydrALAZINE (APRESOLINE) 25 MG tablet Take 25 mg by mouth 3 (three) times daily.   [DISCONTINUED] losartan  (COZAAR ) 100 MG tablet Take 1 tablet (100 mg total) by mouth daily.     Allergies:   Ozempic (0.25 or 0.5 mg-dose) [semaglutide(0.25 or 0.5mg -dos)] and Simvastatin   Social History   Tobacco Use   Smoking status: Former    Current packs/day: 0.00    Average packs/day: 0.5 packs/day for 20.0 years (10.0 ttl pk-yrs)    Types: Cigarettes    Start date: 06/08/1990  Quit date: 06/08/2010    Years since quitting: 13.8   Smokeless tobacco: Never   Tobacco comments:    < 1/2   Vaping Use   Vaping status: Never Used  Substance Use Topics   Alcohol use: Yes    Alcohol/week: 2.0 standard drinks of alcohol    Types: 1 Glasses of wine, 1 Cans of beer per week    Comment: rare beer or glass of wine   Drug use: No     Family Hx: The patient's family history includes AAA (abdominal aortic aneurysm) in his father; Arthritis in his father; CAD in his father; Cancer in his paternal grandmother; Colon cancer in his mother; Heart attack in his father; Heart attack (age of onset: 66) in his paternal grandfather; Hypertension in his father and another family member; Lung cancer in his father and maternal grandmother; Stroke (age of onset: 90) in his maternal grandfather. There is no history of Prostate cancer.  ROS:   Please see the history of present illness.     All other systems reviewed and are negative.   Labs/Other Tests and Data Reviewed:    Recent Labs: 08/08/2023: Hemoglobin 14.2; Platelets 251.0 12/11/2023: ALT 22 12/28/2023: BUN 22; Creatinine, Ser 1.26; Potassium 4.2; Sodium 141   Recent Lipid Panel Lab Results  Component Value Date/Time   CHOL 131 12/11/2023 08:42 AM   CHOL 138 10/10/2021 08:18 AM   TRIG 84.0 12/11/2023 08:42 AM   HDL 45.70 12/11/2023 08:42 AM   HDL 41 10/10/2021 08:18 AM   CHOLHDL 3 12/11/2023 08:42  AM   LDLCALC 69 12/11/2023 08:42 AM   LDLCALC 80 10/10/2021 08:18 AM    Wt Readings from Last 3 Encounters:  03/24/24 230 lb 3.2 oz (104.4 kg)  12/11/23 228 lb (103.4 kg)  10/19/23 228 lb (103.4 kg)    EKG Interpretation Date/Time:  Monday March 24 2024 09:09:09 EST Ventricular Rate:  74 PR Interval:  196 QRS Duration:  118 QT Interval:  402 QTC Calculation: 446 R Axis:   -51  Text Interpretation: Normal sinus rhythm with sinus arrhythmia Left anterior fascicular block Minimal voltage criteria for LVH, may be normal variant ( Cornell product ) When compared with ECG of 11-Jan-2023 14:54, PREVIOUS ECG IS PRESENT Confirmed by Shlomo Corning ([id number]) on 03/24/2024 9:14:49 AM    Objective:    Vital Signs:  BP (!) 156/62 (BP Location: Left Arm, Patient Position: Sitting, Cuff Size: Large)   Pulse 74   Ht 5' 10 (1.778 m)   Wt 230 lb 3.2 oz (104.4 kg)   SpO2 94%   BMI 33.03 kg/m   GEN: Well nourished, well developed in no acute distress HEENT: Normal NECK: No JVD; No carotid bruits LYMPHATICS: No lymphadenopathy CARDIAC:RRR, no murmurs, rubs, gallops RESPIRATORY:  Clear to auscultation without rales, wheezing or rhonchi  ABDOMEN: Soft, non-tender, non-distended MUSCULOSKELETAL:  No edema; No deformity  SKIN: Warm and dry NEUROLOGIC:  Alert and oriented x 3 PSYCHIATRIC:  Normal affect  ASSESSMENT & PLAN:    1.  ASCAD -coronary CTA showed a calcium  score of 0 but 25-49% noncalcified plaque in the mid RCA and mid LAD.   -CT FFR showed a possible flow limiting lesion in the mid to distal LAD but felt likely related to distal tapering of the vessel and not from hemodynamically flow limiting lesion.   -Medical management was recommended at that time.  -Nuclear stress test done for chest pain in 2023 showed no ischemia -Stress PET CT 12/21/2022  showed no ischemia and EF 61% with stress with normal myocardial blood flow reserve -Denies any anginal symptoms -Continue aspirin  81 mg  daily, atorvastatin  40 mg daily -Intolerant to beta-blocker therapy  HTN -BP borderline controlled on exam today>>normal at home with SBP in the 130's at home -intolerant to BB therapy -Continue amlodipine  10 mg daily, hydralazine 25 mg 3 times daily, losartan  100 mg daily with as needed refills -I have personally reviewed and interpreted outside labs performed by patient's PCP which showed SCr 1.26, K+ 4.2 on 12/28/2023  HLD -LDL goal < 70 -I have personally reviewed and interpreted outside labs performed by patient's PCP which showed LDL 69, HDL 45, ALT 22 on 05/11/7972 -Continue atorvastatin  40 mg daily with as needed refills  OSA - The patient is tolerating PAP therapy well without any problems. The PAP download performed by his DME was personally reviewed and interpreted by me today and showed an AHI of 1.9 /hr on auto CPAP from 5-18  cm H2O with 97% compliance in using more than 4 hours nightly.  The patient has been using and benefiting from PAP use and will continue to benefit from therapy.      Medication Adjustments/Labs and Tests Ordered: Current medicines are reviewed at length with the patient today.  Concerns regarding medicines are outlined above.  Tests Ordered: Orders Placed This Encounter  Procedures   EKG 12-Lead   Medication Changes: Meds ordered this encounter  Medications   amLODipine  (NORVASC ) 10 MG tablet    Sig: Take 1 tablet (10 mg total) by mouth daily.    Dispense:  90 tablet    Refill:  3   atorvastatin  (LIPITOR) 80 MG tablet    Sig: Take 1 tablet (80 mg total) by mouth at bedtime.    Dispense:  90 tablet    Refill:  3   hydrALAZINE (APRESOLINE) 25 MG tablet    Sig: Take 1 tablet (25 mg total) by mouth 3 (three) times daily.    Dispense:  270 tablet    Refill:  3   losartan  (COZAAR ) 100 MG tablet    Sig: Take 1 tablet (100 mg total) by mouth daily.    Dispense:  90 tablet    Refill:  3    Disposition:  Follow up in 1 year  Signed, Wilbert Bihari, MD  03/24/2024 9:34 AM    [facility]

## 2024-03-24 NOTE — Addendum Note (Signed)
 Addended by: RUBBIE KERRI HERO on: 03/24/2024 09:43 AM   Modules accepted: Orders

## 2024-03-28 ENCOUNTER — Other Ambulatory Visit: Payer: Self-pay | Admitting: Internal Medicine

## 2024-03-30 ENCOUNTER — Other Ambulatory Visit: Payer: Self-pay | Admitting: Internal Medicine

## 2024-04-11 ENCOUNTER — Ambulatory Visit: Admitting: Internal Medicine

## 2024-04-12 ENCOUNTER — Other Ambulatory Visit: Payer: Self-pay | Admitting: Internal Medicine

## 2024-04-30 ENCOUNTER — Encounter: Payer: Self-pay | Admitting: Internal Medicine

## 2024-04-30 ENCOUNTER — Ambulatory Visit: Admitting: Internal Medicine

## 2024-04-30 VITALS — BP 126/68 | HR 80 | Temp 98.0°F | Resp 18 | Ht 70.0 in | Wt 227.0 lb

## 2024-04-30 DIAGNOSIS — N1831 Chronic kidney disease, stage 3a: Secondary | ICD-10-CM

## 2024-04-30 DIAGNOSIS — R809 Proteinuria, unspecified: Secondary | ICD-10-CM

## 2024-04-30 DIAGNOSIS — Z7984 Long term (current) use of oral hypoglycemic drugs: Secondary | ICD-10-CM | POA: Diagnosis not present

## 2024-04-30 DIAGNOSIS — I2510 Atherosclerotic heart disease of native coronary artery without angina pectoris: Secondary | ICD-10-CM

## 2024-04-30 DIAGNOSIS — E1169 Type 2 diabetes mellitus with other specified complication: Secondary | ICD-10-CM

## 2024-04-30 DIAGNOSIS — I10 Essential (primary) hypertension: Secondary | ICD-10-CM | POA: Diagnosis not present

## 2024-04-30 DIAGNOSIS — R972 Elevated prostate specific antigen [PSA]: Secondary | ICD-10-CM

## 2024-04-30 MED ORDER — HYDRALAZINE HCL 25 MG PO TABS: 25.0000 mg | ORAL_TABLET | Freq: Three times a day (TID) | ORAL | 3 refills | Status: AC

## 2024-04-30 MED ORDER — TADALAFIL 10 MG PO TABS: 10.0000 mg | ORAL_TABLET | Freq: Every day | ORAL | 6 refills | Status: AC | PRN

## 2024-04-30 NOTE — Progress Notes (Signed)
 "  Subjective:    Patient ID: Carl Knapp, male    DOB: Oct 01, 1945, 78 y.o.   MRN: [id number]  DOS:  04/30/2024 Routine follow-up  Discussed the use of AI scribe software for clinical note transcription with the patient, who gave verbal consent to proceed.  History of Present Illness Carl Knapp is a 78 year old male who presents for a routine checkup.  Cutaneous lesion -  spot on the nose, noticed by his wife - History of Mohs surgery on the nose - Ongoing dermatology follow up  Hypertension - Treated with losartan  100 mg daily, amlodipine  10 mg daily, and hydralazine    - Home blood pressure readings 2 to 3 times per week typically range from 128 to 135 mmHg  Diabetes mellitus - Treated with pioglitazone , metformin , and Jardiance  - Home glucose readings range from 80 to 119 mg/dL, usually around 894 mg/dL  Erectile dysfunction - Requests change from unfilled sildenafil prescription to tadalafil   Associated symptoms - No chest pain, respiratory symptoms, gastrointestinal upset, or urinary symptoms    Review of Systems See above   Past Medical History:  Diagnosis Date   Allergy    Anxiety    Aortic atherosclerosis    Arthritis    Atrial tachycardia, paroxysmal    Noted to have up to 14 beats in a row of atrial tachycardia on event monitor for/2023   Chronic kidney disease    Coronary artery disease 12/2013   20-30% RCA by cath- states was good.   Diabetes mellitus without complication (HCC)    type II   Drug-induced myopathy    DVT (deep venous thrombosis) (HCC)    bilateral legs-greater on left. -3-5 years ago-tx. coumadin x 1 year.   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   L   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    Past Surgical History:  Procedure Laterality Date   AMPUTATION FINGER Left 01/11/2023   index and ring finger   APPENDECTOMY     open '68   HERNIA REPAIR Bilateral 1980   inguinal   INCISION AND DRAINAGE Left 01/11/2023   Procedure: INCISION AND DRAINAGE LEFT INDEX FINGER AND LEFT RING FINGER;  Surgeon: Delene Marsa HERO, MD;  Location: [facility];  Service: Orthopedics;  Laterality: Left;   INSERTION OF MESH N/A 06/14/2017   Procedure: INSERTION OF MESH;  Surgeon: Ethyl Lenis, MD;  Location: [facility];  Service: General;  Laterality: N/A;   LEFT HEART CATHETERIZATION WITH CORONARY ANGIOGRAM N/A 12/09/2013   Procedure: LEFT HEART CATHETERIZATION WITH CORONARY ANGIOGRAM;  Surgeon: Candyce GORMAN Reek, MD;  Location: [facility];  Service: Cardiovascular;  Laterality: N/A;   TONSILLECTOMY     UMBILICAL HERNIA REPAIR N/A 06/14/2017   Procedure: LAPAROSCOPIC UMBILICAL HERNIA REPAIR WITH MESH ERAS PATHWAY;  Surgeon: Ethyl Lenis,  MD;  Location: [facility];  Service: General;  Laterality: N/A;  ERAS PATHWAY   VASECTOMY      Current Outpatient Medications  Medication Instructions   Accu-Chek Softclix Lancets lancets Check blood sugars 1-2 times daily   acetaminophen  (TYLENOL ) 1,000 mg, Daily PRN   amLODipine  (NORVASC ) 10 mg, Oral, Daily   aspirin  EC 81 mg, Daily   atorvastatin  (LIPITOR) 40 mg, Daily   glucose blood (ACCU-CHEK GUIDE TEST) test strip Check blood sugars 1-2 times daily   hydrALAZINE  (APRESOLINE ) 25 mg, Oral, 3 times daily   Jardiance  10 mg, Oral, Daily before breakfast   ketoconazole (NIZORAL) 2 % cream 1 application , 2 times  daily PRN   loratadine (CLARITIN) 10 mg, Daily PRN   losartan  (COZAAR ) 100 mg, Oral, Daily   metFORMIN  (GLUCOPHAGE ) 1000 MG tablet TAKE 1 TABLET (1,000 MG TOTAL) BY MOUTH TWICE A DAY WITH FOOD   methocarbamol  (ROBAXIN -750) 750 mg, Oral, 2 times daily PRN   metroNIDAZOLE  (METROGEL ) 1 % gel Topical, Daily   Multiple Vitamins-Minerals (PRESERVISION AREDS 2) CAPS 1 capsule, 2 times daily   multivitamin (ONE-A-DAY MEN'S) TABS tablet 1 tablet, Daily   Omega-3 Fatty Acids (FISH OIL) 1000 MG CAPS 1 capsule, 2 times daily   pioglitazone  (ACTOS ) 45 mg, Oral, Every morning   tadalafil  (CIALIS ) 10 mg, Oral, Daily PRN   triamcinolone  cream (KENALOG ) 0.1 % 1 Application, Topical, 2 times daily PRN       Objective:   Physical Exam BP 126/68   Pulse 80   Temp 98 F (36.7 C) (Oral)   Resp 18   Ht 5' 10 (1.778 m)   Wt 227 lb (103 kg)   SpO2 94%   BMI 32.57 kg/m  General: Well developed, NAD, BMI noted Neck: No  thyromegaly  HEENT:  Normocephalic . Face symmetric, atraumatic Lungs:  CTA B Normal respiratory effort, no intercostal retractions, no accessory muscle use. Heart: RRR,  no murmur.  Abdomen:  Not distended, soft, non-tender. No rebound or rigidity.   Lower extremities: no pretibial edema bilaterally  Skin: Exposed areas without rash. Not pale. Not jaundice Neurologic:  alert & oriented X3.  Speech normal, gait appropriate for age and unassisted Strength symmetric and appropriate for age.  Psych: Cognition and judgment appear intact.  Cooperative with normal attention span and concentration.  Behavior appropriate. No anxious or depressed appearing.     Assessment   Assessment  (new patient 09/2021, previous PCP retiring) DM dx ~ 2010 + Microalbumin: Sees nephrology  HTN.  Beta-blocker intolerant and per cardiology note 10/2022. High cholesterol CV: --- CAD, dr turner  ---TIA (?)  June 2023 ---SVT OSA, Cpap, Dr shlomo  GERD (remotely) Macular degeneration COPD  findings per coronary PET scan 12-2022, former smoker H/o DVT Assessment & Plan DM with CAD and microalbuminuria. Reports satisfactory CBGs, continue pioglitazone , metformin , and Jardiance .  Check A1c. Was evaluated by nephrology last year for microalbuminuria.  Will recheck today.  Consider  refer back to nephrology. HTN:  Hydralazine  dosage discrepancy noted.  Cardiology recommended 25 mg TID but he just a refill for  50 mg twice daily as prescribed by nephrology.  Plan: - Go back on hydralazine  to 25 mg three times daily.  Prescription sent - Continue amlodipine , losartan   - Checked BMP and CBC.  Dermatology: I see he had multiple visit with dermatology, he still has some irritation at the nose, recommend to discuss with dermatology.   Erectile dysfunction Request a refill, in the past  sildenafil did not help, was prescribed Cialis  daily but states he never got it filled.  Plan: Cialis  10 mg daily as needed.  Rx sent.  Precautions discussed.  See AVS CAD: Saw cardiology in November, no changes made. General Health Maintenance Td 2019. PNM 13 2015 PNM 23: 2013, 2023. Shingrix x 2 RSV- had one already Had a flu shot; had a covid vas this year (01/2024) Vaccines I recommend:  PNM 20 12/24/2020: Colonoscopy, polypoid colonic mucosa, no further colonoscopies Prostate cancer screening: PSA December 2024 1.5.  Previous 0.9.  Option to stop or check PSA again discussed, elected to check. RTC 4 to 5 months    "

## 2024-04-30 NOTE — Assessment & Plan Note (Signed)
 DM with CAD and microalbuminuria. Reports satisfactory CBGs, continue pioglitazone , metformin , and Jardiance .  Check A1c. Was evaluated by nephrology last year for microalbuminuria.  Will recheck today.  Consider  refer back to nephrology. HTN:  Hydralazine  dosage discrepancy noted.  Cardiology recommended 25 mg TID but he just a refill for  50 mg twice daily as prescribed by nephrology.  Plan: - Go back on hydralazine  to 25 mg three times daily.  Prescription sent - Continue amlodipine , losartan   - Checked BMP and CBC.  Dermatology: I see he had multiple visit with dermatology, he still has some irritation at the nose, recommend to discuss with dermatology.   Erectile dysfunction Request a refill, in the past sildenafil did not help, was prescribed Cialis  daily but states he never got it filled.  Plan: Cialis  10 mg daily as needed.  Rx sent.  Precautions discussed.  See AVS CAD: Saw cardiology in November, no changes made. General Health Maintenance: Reviewed RTC 4 to 5 months

## 2024-04-30 NOTE — Patient Instructions (Signed)
 GO TO THE LAB :  Get the blood work    Then, go to the front desk for the checkout Please make an appointment for a checkup in 4 to 5 months   Vaccines I recommend: Pneumonia shot 20.  Will switch you back to hydralazine  25 mg: 1 tablet 3 times a day, will send in a prescription.  Continue checking your blood pressure regularly Blood pressure goal:  between 110/65 and  135/85. If it is consistently higher or lower, let me know  Diabetes  You can check your sugars at different times  - early in AM fasting  ( blood sugar goal 70-130) - 2 hours after a meal (blood sugar goal less than 180)       TYPE 2 DIABETES MELLITUS: Your blood sugar levels are stable with your current medications. -Continue taking pioglitazone , metformin , and Jardiance  as prescribed. -We checked your A1c level today.  HYPERTENSION: Your blood pressure is well-controlled with your current medications, but there was a discrepancy in your hydralazine  dosage. - Go back on hydralazine  to 25 mg three times daily.  A prescription has been sent    ERECTILE DYSFUNCTION: You requested a change from sildenafil to tadalafil . -A prescription for tadalafil  (Cialis ) has been provided. - Watch for side effects such as runny nose headache.  Never mix it with a medication called nitroglycerin 

## 2024-04-30 NOTE — Assessment & Plan Note (Signed)
 Preventive care reviewed: Td 2019. PNM 13 2015 PNM 23: 2013, 2023. Shingrix x 2 RSV- had one already Had a flu shot; had a covid vas this year (01/2024) Vaccines I recommend:  PNM 20 12/24/2020: Colonoscopy, polypoid colonic mucosa, no further colonoscopies Prostate cancer screening: PSA December 2024 1.5.  Previous 0.9.  Option to stop or check PSA again discussed, elected to check.

## 2024-05-01 LAB — CBC WITH DIFFERENTIAL/PLATELET
Absolute Lymphocytes: 1235 {cells}/uL (ref 850–3900)
Absolute Monocytes: 813 {cells}/uL (ref 200–950)
Basophils Absolute: 39 {cells}/uL (ref 0–200)
Basophils Relative: 0.6 %
Eosinophils Absolute: 306 {cells}/uL (ref 15–500)
Eosinophils Relative: 4.7 %
HCT: 43.9 % (ref 39.4–51.1)
Hemoglobin: 14.3 g/dL (ref 13.2–17.1)
MCH: 31 pg (ref 27.0–33.0)
MCHC: 32.6 g/dL (ref 31.6–35.4)
MCV: 95.2 fL (ref 81.4–101.7)
MPV: 10.1 fL (ref 7.5–12.5)
Monocytes Relative: 12.5 %
Neutro Abs: 4108 {cells}/uL (ref 1500–7800)
Neutrophils Relative %: 63.2 %
Platelets: 259 Thousand/uL (ref 140–400)
RBC: 4.61 Million/uL (ref 4.20–5.80)
RDW: 12.9 % (ref 11.0–15.0)
Total Lymphocyte: 19 %
WBC: 6.5 Thousand/uL (ref 3.8–10.8)

## 2024-05-01 LAB — BASIC METABOLIC PANEL WITH GFR
BUN/Creatinine Ratio: 18 (calc) (ref 6–22)
BUN: 24 mg/dL (ref 7–25)
CO2: 22 mmol/L (ref 20–32)
Calcium: 9.2 mg/dL (ref 8.6–10.3)
Chloride: 108 mmol/L (ref 98–110)
Creat: 1.31 mg/dL — ABNORMAL HIGH (ref 0.70–1.28)
Glucose, Bld: 235 mg/dL — ABNORMAL HIGH (ref 65–99)
Potassium: 3.9 mmol/L (ref 3.5–5.3)
Sodium: 142 mmol/L (ref 135–146)
eGFR: 56 mL/min/1.73m2 — ABNORMAL LOW

## 2024-05-01 LAB — MICROALBUMIN / CREATININE URINE RATIO
Creatinine, Urine: 58 mg/dL (ref 20–320)
Microalb Creat Ratio: 255 mg/g{creat} — ABNORMAL HIGH
Microalb, Ur: 14.8 mg/dL

## 2024-05-01 LAB — PSA: PSA: 1.2 ng/mL

## 2024-05-01 LAB — HEMOGLOBIN A1C
Hgb A1c MFr Bld: 6.8 % — ABNORMAL HIGH
Mean Plasma Glucose: 148 mg/dL
eAG (mmol/L): 8.2 mmol/L

## 2024-05-02 ENCOUNTER — Ambulatory Visit: Payer: Self-pay | Admitting: Internal Medicine

## 2024-05-02 DIAGNOSIS — R809 Proteinuria, unspecified: Secondary | ICD-10-CM

## 2024-05-12 ENCOUNTER — Other Ambulatory Visit: Payer: Self-pay | Admitting: Internal Medicine

## 2024-05-12 DIAGNOSIS — I10 Essential (primary) hypertension: Secondary | ICD-10-CM

## 2024-05-12 DIAGNOSIS — E1169 Type 2 diabetes mellitus with other specified complication: Secondary | ICD-10-CM

## 2024-05-12 NOTE — Telephone Encounter (Signed)
 Copied from CRM [id number]. Topic: Clinical - Medication Refill >> May 12, 2024  3:55 PM Mercedes MATSU wrote: Medication: atorvastatin  (LIPITOR) 40 MG tablet   triamcinolone  cream (KENALOG ) 0.1 %   Has the patient contacted their pharmacy? Yes (Agent: If no, request that the patient contact the pharmacy for the refill. If patient does not wish to contact the pharmacy document the reason why and proceed with request.) (Agent: If yes, when and what did the pharmacy advise?)  This is the patient's preferred pharmacy:  [facility] [place], [place] - [address] [facility] [address] [place] [address] Phone: [phone] Fax: [phone]  Is this the correct pharmacy for this prescription? Yes If no, delete pharmacy and type the correct one.   Has the prescription been filled recently? Yes  Is the patient out of the medication? Yes  Has the patient been seen for an appointment in the last year OR does the patient have an upcoming appointment? Yes  Can we respond through MyChart? Yes  Agent: Please be advised that Rx refills may take up to 3 business days. We ask that you follow-up with your pharmacy.

## 2024-05-13 MED ORDER — ATORVASTATIN CALCIUM 40 MG PO TABS: 40.0000 mg | ORAL_TABLET | Freq: Every day | ORAL | 1 refills | Status: AC

## 2024-05-13 MED ORDER — TRIAMCINOLONE ACETONIDE 0.1 % EX CREA: 1.0000 | TOPICAL_CREAM | Freq: Two times a day (BID) | CUTANEOUS | 1 refills | Status: AC | PRN

## 2024-05-19 NOTE — Progress Notes (Signed)
 [facility] PRE[facility] Evaluation  Patient Details  Name: JOZEF EISENBEIS MRN: [id number] Date of Birth: Apr 10, 1946 Age: 79 y.o. PCP: Amon Aloysius BRAVO, MD  Vitals:   05/19/24 0907  BP: (!) 150/70  Pulse: 83  SpO2: 94%  Weight: 228 lb 3.2 oz (103.5 kg)     [facility] Eval - 05/19/24 0900       [facility] PREP Location   [facility] PREP Location [facility]      Referral    Referring Provider self/Paz    Reason for referral Hypertension;Obesitity/Overweight;Diabetes;Orthopedic    Program Start Date 05/26/24      Measurement   Waist Circumference 49 inches    Hip Circumference 44 inches    Body fat 33.4 percent      Information for Trainer   Goals --   Maintain health, lose 5-10 pounds by end of program; strength training for back/knee pain   Current Exercise --   swimming M,W,F; walking   Orthopedic Concerns --   knee and back pain   Pertinent Medical History --   HTN, OSA, diabetes     Timed Up and Go (TUGS)   Timed Up and Go Low risk <9 seconds      Mobility and Daily Activities   I find it easy to walk up or down two or more flights of stairs. 3    I have no trouble taking out the trash. 4    I do housework such as vacuuming and dusting on my own without difficulty. 4    I can easily lift a gallon of milk (8lbs). 4    I can easily walk a mile. 4    I have no trouble reaching into high cupboards or reaching down to pick up something from the floor. 2    I do not have trouble doing out-door work such as loss adjuster, chartered, raking leaves, or gardening. 4      Mobility and Daily Activities   I feel younger than my age. 4    I feel independent. 4    I feel energetic. 3    I live an active life.  4    I feel strong. 3    I feel healthy. 4    I feel active as other people my age. 4      How fit and strong are you.   Fit and Strong Total Score 51         Past Medical History:  Diagnosis Date   Allergy    Anxiety    Aortic atherosclerosis    Arthritis    Atrial tachycardia, paroxysmal     Noted to have up to 14 beats in a row of atrial tachycardia on event monitor for/2023   Chronic kidney disease    Coronary artery disease 12/2013   20-30% RCA by cath- states was good.   Diabetes mellitus without complication (HCC)    type II   Drug-induced myopathy    DVT (deep venous thrombosis) (HCC)    bilateral legs-greater on left. -3-5 years ago-tx. coumadin x 1 year.   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   L   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   Past Surgical History:  Procedure Laterality Date   AMPUTATION FINGER Left 01/11/2023   index and ring finger   APPENDECTOMY     open '68   HERNIA REPAIR Bilateral 1980   inguinal   INCISION AND DRAINAGE Left 01/11/2023   Procedure: INCISION AND DRAINAGE LEFT INDEX FINGER AND LEFT RING FINGER;  Surgeon: Delene Marsa HERO, MD;  Location: [facility];  Service: Orthopedics;  Laterality: Left;   INSERTION OF MESH N/A 06/14/2017   Procedure: INSERTION OF MESH;  Surgeon: Ethyl Lenis, MD;  Location: [facility];  Service: General;  Laterality: N/A;   LEFT HEART CATHETERIZATION WITH CORONARY ANGIOGRAM N/A 12/09/2013   Procedure: LEFT HEART CATHETERIZATION WITH CORONARY ANGIOGRAM;  Surgeon: Candyce GORMAN Reek, MD;  Location: [facility] CATH LAB;  Service: Cardiovascular;  Laterality: N/A;   TONSILLECTOMY     UMBILICAL HERNIA REPAIR N/A 06/14/2017    Procedure: LAPAROSCOPIC UMBILICAL HERNIA REPAIR WITH MESH ERAS PATHWAY;  Surgeon: Ethyl Lenis, MD;  Location: [facility];  Service: General;  Laterality: N/A;  ERAS PATHWAY   VASECTOMY     To begin PREP class at [facility] on 05/26/24, every M/W at 12  Rebecka Oelkers B Cailyn Houdek 05/19/2024, 9:11 AM

## 2024-05-26 NOTE — Progress Notes (Signed)
 [facility] PRE[facility] Weekly Session  Patient Details  Name: Carl Knapp MRN: [id number] Date of Birth: Oct 13, 1945 Age: 79 y.o. PCP: Amon Aloysius BRAVO, MD  There were no vitals filed for this visit.   [facility] Weekly seesion - 05/26/24 1300       [facility] PREP Location   [facility] PREP Location [facility]      Weekly Session   Topic Discussed Goal setting and welcome to the program   Introductions, review of notebook, tour of facility, offered option to work out on cardio   Classes attended to date 1          [name] 05/26/2024, 1:53 PM

## 2024-06-11 NOTE — Progress Notes (Signed)
 [facility] PRE[facility] Weekly Session  Patient Details  Name: ROBERTT BUDA MRN: [id number] Date of Birth: May 25, 1945 Age: 79 y.o. PCP: Amon Aloysius BRAVO, MD  There were no vitals filed for this visit.   [facility] Weekly seesion - 06/11/24 1300       [facility] PREP Location   [facility] PREP Location [facility]      Weekly Session   Topic Discussed Importance of resistance training;Other ways to be active   Goal: 150 minutes cardio/week and strength training 2-3 times/week for 20-40 minutes by end of program.   Minutes exercised this week 345 minutes    Classes attended to date 4          Timoty Bourke B Marlisa Caridi 06/11/2024, 1:38 PM

## 2024-09-01 ENCOUNTER — Ambulatory Visit: Admitting: Internal Medicine

## 2024-10-21 ENCOUNTER — Ambulatory Visit
